# Patient Record
Sex: Female | Born: 1945 | Race: White | Hispanic: No | Marital: Married | Smoking: Former smoker
Health system: Southern US, Community
[De-identification: ages and names within clinical notes are randomized; demographics above are authoritative.]

## PROBLEM LIST (undated history)

## (undated) DIAGNOSIS — J984 Other disorders of lung: Secondary | ICD-10-CM

## (undated) DIAGNOSIS — E119 Type 2 diabetes mellitus without complications: Secondary | ICD-10-CM

## (undated) DIAGNOSIS — I219 Acute myocardial infarction, unspecified: Secondary | ICD-10-CM

## (undated) DIAGNOSIS — N289 Disorder of kidney and ureter, unspecified: Secondary | ICD-10-CM

## (undated) DIAGNOSIS — I2699 Other pulmonary embolism without acute cor pulmonale: Secondary | ICD-10-CM

## (undated) DIAGNOSIS — N200 Calculus of kidney: Secondary | ICD-10-CM

## (undated) DIAGNOSIS — M797 Fibromyalgia: Secondary | ICD-10-CM

## (undated) DIAGNOSIS — I10 Essential (primary) hypertension: Secondary | ICD-10-CM

## (undated) DIAGNOSIS — E7800 Pure hypercholesterolemia, unspecified: Secondary | ICD-10-CM

## (undated) HISTORY — PX: FOOT SURGERY: SHX648

## (undated) HISTORY — PX: CHOLECYSTECTOMY: SHX55

## (undated) HISTORY — PX: KIDNEY SURGERY: SHX687

## (undated) HISTORY — DX: Other pulmonary embolism without acute cor pulmonale: I26.99

## (undated) HISTORY — PX: ABDOMINAL HYSTERECTOMY: SHX81

## (undated) HISTORY — DX: Pure hypercholesterolemia, unspecified: E78.00

## (undated) HISTORY — PX: TONSILLECTOMY: SUR1361

## (undated) HISTORY — PX: CORONARY STENT PLACEMENT: SHX1402

## (undated) HISTORY — DX: Essential (primary) hypertension: I10

## (undated) HISTORY — DX: Calculus of kidney: N20.0

---

## 2012-08-20 DIAGNOSIS — N200 Calculus of kidney: Secondary | ICD-10-CM | POA: Insufficient documentation

## 2012-08-20 DIAGNOSIS — N183 Chronic kidney disease, stage 3 unspecified: Secondary | ICD-10-CM | POA: Insufficient documentation

## 2012-08-20 DIAGNOSIS — I10 Essential (primary) hypertension: Secondary | ICD-10-CM | POA: Insufficient documentation

## 2012-08-20 DIAGNOSIS — F418 Other specified anxiety disorders: Secondary | ICD-10-CM | POA: Insufficient documentation

## 2012-08-20 DIAGNOSIS — K219 Gastro-esophageal reflux disease without esophagitis: Secondary | ICD-10-CM | POA: Insufficient documentation

## 2012-10-06 DIAGNOSIS — I519 Heart disease, unspecified: Secondary | ICD-10-CM | POA: Insufficient documentation

## 2013-02-18 DIAGNOSIS — G4733 Obstructive sleep apnea (adult) (pediatric): Secondary | ICD-10-CM | POA: Insufficient documentation

## 2014-05-16 DIAGNOSIS — I255 Ischemic cardiomyopathy: Secondary | ICD-10-CM | POA: Insufficient documentation

## 2014-11-03 DIAGNOSIS — E662 Morbid (severe) obesity with alveolar hypoventilation: Secondary | ICD-10-CM | POA: Insufficient documentation

## 2015-06-12 DIAGNOSIS — R5383 Other fatigue: Secondary | ICD-10-CM | POA: Insufficient documentation

## 2016-01-04 DIAGNOSIS — F419 Anxiety disorder, unspecified: Secondary | ICD-10-CM | POA: Insufficient documentation

## 2016-01-04 DIAGNOSIS — E785 Hyperlipidemia, unspecified: Secondary | ICD-10-CM | POA: Insufficient documentation

## 2016-01-04 DIAGNOSIS — J449 Chronic obstructive pulmonary disease, unspecified: Secondary | ICD-10-CM | POA: Insufficient documentation

## 2016-01-04 DIAGNOSIS — B372 Candidiasis of skin and nail: Secondary | ICD-10-CM | POA: Insufficient documentation

## 2016-08-01 DIAGNOSIS — M797 Fibromyalgia: Secondary | ICD-10-CM | POA: Insufficient documentation

## 2016-09-01 DIAGNOSIS — E559 Vitamin D deficiency, unspecified: Secondary | ICD-10-CM | POA: Insufficient documentation

## 2017-02-10 DIAGNOSIS — I209 Angina pectoris, unspecified: Secondary | ICD-10-CM | POA: Insufficient documentation

## 2017-02-10 DIAGNOSIS — E871 Hypo-osmolality and hyponatremia: Secondary | ICD-10-CM | POA: Insufficient documentation

## 2017-04-15 DIAGNOSIS — N952 Postmenopausal atrophic vaginitis: Secondary | ICD-10-CM | POA: Insufficient documentation

## 2018-02-01 ENCOUNTER — Emergency Department (HOSPITAL_COMMUNITY)
Admission: EM | Admit: 2018-02-01 | Discharge: 2018-02-02 | Disposition: A | Discharge status: Home / Self Care | Payer: Medicare Other | Attending: Emergency Medicine | Admitting: Emergency Medicine

## 2018-02-01 ENCOUNTER — Emergency Department (HOSPITAL_COMMUNITY): Payer: Medicare Other

## 2018-02-01 ENCOUNTER — Other Ambulatory Visit: Payer: Self-pay

## 2018-02-01 ENCOUNTER — Encounter (HOSPITAL_COMMUNITY): Payer: Self-pay | Admitting: Emergency Medicine

## 2018-02-01 DIAGNOSIS — M542 Cervicalgia: Secondary | ICD-10-CM | POA: Insufficient documentation

## 2018-02-01 DIAGNOSIS — S22000A Wedge compression fracture of unspecified thoracic vertebra, initial encounter for closed fracture: Secondary | ICD-10-CM | POA: Diagnosis not present

## 2018-02-01 DIAGNOSIS — W19XXXA Unspecified fall, initial encounter: Secondary | ICD-10-CM

## 2018-02-01 DIAGNOSIS — S50311A Abrasion of right elbow, initial encounter: Secondary | ICD-10-CM | POA: Diagnosis not present

## 2018-02-01 DIAGNOSIS — Y9289 Other specified places as the place of occurrence of the external cause: Secondary | ICD-10-CM | POA: Insufficient documentation

## 2018-02-01 DIAGNOSIS — Z79899 Other long term (current) drug therapy: Secondary | ICD-10-CM | POA: Insufficient documentation

## 2018-02-01 DIAGNOSIS — M25551 Pain in right hip: Secondary | ICD-10-CM | POA: Diagnosis not present

## 2018-02-01 DIAGNOSIS — S0003XA Contusion of scalp, initial encounter: Secondary | ICD-10-CM | POA: Insufficient documentation

## 2018-02-01 DIAGNOSIS — Z7984 Long term (current) use of oral hypoglycemic drugs: Secondary | ICD-10-CM | POA: Insufficient documentation

## 2018-02-01 DIAGNOSIS — E119 Type 2 diabetes mellitus without complications: Secondary | ICD-10-CM | POA: Diagnosis not present

## 2018-02-01 DIAGNOSIS — W01198A Fall on same level from slipping, tripping and stumbling with subsequent striking against other object, initial encounter: Secondary | ICD-10-CM | POA: Insufficient documentation

## 2018-02-01 DIAGNOSIS — Z955 Presence of coronary angioplasty implant and graft: Secondary | ICD-10-CM | POA: Insufficient documentation

## 2018-02-01 DIAGNOSIS — Y999 Unspecified external cause status: Secondary | ICD-10-CM | POA: Insufficient documentation

## 2018-02-01 DIAGNOSIS — S5001XA Contusion of right elbow, initial encounter: Secondary | ICD-10-CM

## 2018-02-01 DIAGNOSIS — Y9389 Activity, other specified: Secondary | ICD-10-CM | POA: Insufficient documentation

## 2018-02-01 DIAGNOSIS — Z7982 Long term (current) use of aspirin: Secondary | ICD-10-CM | POA: Diagnosis not present

## 2018-02-01 DIAGNOSIS — S0990XA Unspecified injury of head, initial encounter: Secondary | ICD-10-CM | POA: Diagnosis present

## 2018-02-01 HISTORY — DX: Disorder of kidney and ureter, unspecified: N28.9

## 2018-02-01 HISTORY — DX: Type 2 diabetes mellitus without complications: E11.9

## 2018-02-01 HISTORY — DX: Fibromyalgia: M79.7

## 2018-02-01 NOTE — ED Notes (Signed)
Patient transported to X-ray 

## 2018-02-01 NOTE — ED Notes (Signed)
Bed: [id number] Expected date:  Expected time:  Means of arrival:  Comments: [age] yr female, fall, hip pain, head injury

## 2018-02-01 NOTE — ED Notes (Signed)
Pt back from radiology at this time.

## 2018-02-01 NOTE — ED Provider Notes (Signed)
[facility]-EMERGENCY DEPT Provider Note   CSN: [id number] Arrival date & time: 02/01/18  2116     History   Chief Complaint Chief Complaint  Patient presents with  . Fall    HPI Valerie Keller is a 72 y.o. female.  HPI  72 year old female presents after a fall.  She was getting out of her car and bending down to pick up something when her hand slipped from on the car and she fell down to the ground.  She landed on the cement.  She thinks she hit her head on her occiput first.  She did not lose consciousness and denies dizziness but is having a severe headache.  This occurred about 30 minutes prior to arrival.  She also injured her neck, midline and right-sided and upper back.  She has chronic low back pain and fibromyalgia but states that her low back pain is not any new or different since the fall.  Has an abrasion to her right elbow and pain over her right lateral hip. Last tetanus within last year.  Past Medical History:  Diagnosis Date  . Diabetes mellitus without complication (HCC)   . Fibromyalgia   . Renal disorder     There are no active problems to display for this patient.   Past Surgical History:  Procedure Laterality Date  . ABDOMINAL HYSTERECTOMY    . CHOLECYSTECTOMY    . CORONARY STENT PLACEMENT     x3  . TONSILLECTOMY       OB History   None      Home Medications    Prior to Admission medications   Medication Sig Start Date End Date Taking? Authorizing Provider  albuterol (PROVENTIL) (2.5 MG/3ML) 0.083% nebulizer solution Take 3 mLs by nebulization every 4 (four) hours as needed for shortness of breath. 01/13/18 02/13/18 Yes [provider]  Alpha-Lipoic Acid 300 MG CAPS Take 1 capsule by mouth daily.   Yes [provider]  aspirin 81 MG tablet Take 81 mg by mouth daily.   Yes [provider]  atorvastatin (LIPITOR) 40 MG tablet Take 40 mg by mouth at bedtime. 01/07/18  Yes [provider]    carvedilol (COREG) 6.25 MG tablet Take 6.25 mg by mouth 2 (two) times daily. 11/20/17 11/21/18 Yes [provider]  clonazePAM (KLONOPIN) 2 MG tablet Take 1-2 tablets by mouth 2 (two) times daily. 1 tablet in the AM and 2 tablets in the evening 01/09/18  Yes [provider]  DULoxetine (CYMBALTA) 60 MG capsule Take 2 capsules by mouth daily. 12/01/17  Yes [provider]  fluticasone (FLONASE ALLERGY RELIEF) 50 MCG/ACT nasal spray Place 2 sprays into the nose daily as needed for allergies.   Yes [provider]  furosemide (LASIX) 40 MG tablet Take 80 mg by mouth daily. 10/23/17  Yes [provider]  levOCARNitine L-Tartrate (L-CARNITINE) 500 MG CAPS Take 500 mg by mouth daily. 05/14/15  Yes [provider]  Magnesium Oxide 400 (240 Mg) MG TABS Take 1-2 tablets by mouth 2 (two) times daily. 1 tablet in the AM and 2 tablets in the evening 11/09/17  Yes [provider]  metFORMIN (GLUCOPHAGE) 850 MG tablet Take 0.5 tablets by mouth 2 (two) times daily. 10/15/17  Yes [provider]  Multiple Vitamin (MULTIVITAMIN) capsule Take 1 capsule by mouth daily.   Yes [provider]  nitroGLYCERIN (NITROSTAT) 0.4 MG SL tablet PLACE 1 TABLET UNDER TONGUE EVERY 5 MIN UP TO 3  DOSES AS NEEDED FOR CHEST PAIN.CALL 911 IF PERSISTS 02/03/17 02/04/18 Yes [provider]  potassium chloride SA (K-DUR,KLOR-CON) 20 MEQ tablet Take 20 mEq by mouth daily.   Yes [provider]  rOPINIRole (REQUIP) 2 MG tablet Take 3 tablets by mouth at bedtime. 08/15/17 08/16/18 Yes [provider]  spironolactone (ALDACTONE) 25 MG tablet Take 25 mg by mouth daily. 12/01/17  Yes [provider]  Vitamin D, Ergocalciferol, (DRISDOL) 50000 units CAPS capsule Take 1 capsule by mouth every 14 (fourteen) days. 01/07/18  Yes [provider]    Family History History reviewed. No pertinent family history.  Social History Social  History   Tobacco Use  . Smoking status: Never Smoker  . Smokeless tobacco: Never Used  Substance Use Topics  . Alcohol use: Never    Frequency: Never  . Drug use: Never     Allergies   Ciprofloxacin; Penicillins; and Latex   Review of Systems Review of Systems  Cardiovascular: Negative for chest pain.  Gastrointestinal: Negative for abdominal pain and vomiting.  Musculoskeletal: Positive for back pain and neck pain.  Neurological: Positive for headaches. Negative for weakness.  All other systems reviewed and are negative.    Physical Exam Updated Vital Signs BP (!) 123/91 (BP Location: Left Arm)   Pulse 87   Temp 97.9 F (36.6 C) (Oral)   Resp 18   Ht 5\' 5"  (1.651 m)   Wt 96.2 kg (212 lb)   SpO2 95%   BMI 35.28 kg/m   Physical Exam  Constitutional: She is oriented to person, place, and time. She appears well-developed and well-nourished.  HENT:  Head: Normocephalic. Head is with abrasion and with contusion.    Right Ear: External ear normal.  Left Ear: External ear normal.  Nose: Nose normal.  Eyes: Right eye exhibits no discharge. Left eye exhibits no discharge.  Neck: Spinous process tenderness and muscular tenderness (right sided) present.  Neck in towel roll  Cardiovascular: Normal rate, regular rhythm and normal heart sounds.  Pulses:      Radial pulses are 2+ on the right side.       Dorsalis pedis pulses are 2+ on the right side.  Pulmonary/Chest: Effort normal and breath sounds normal.  Abdominal: Soft. There is no tenderness.  Musculoskeletal:       Right shoulder: Normal. She exhibits normal range of motion and no tenderness.       Right elbow: She exhibits normal range of motion and no deformity. Tenderness (mild) found.       Right hip: She exhibits tenderness (mild, lateral). She exhibits normal range of motion.       Cervical back: She exhibits tenderness.       Thoracic back: She exhibits tenderness.       Lumbar back: She exhibits no  tenderness.       Right upper arm: She exhibits no tenderness.       Right forearm: She exhibits no tenderness.       Arms:      Legs: Normal radial, ulnar and median nerve testing in RUE.  Neurological: She is alert and oriented to person, place, and time.  Skin: Skin is warm and dry.  Nursing note and vitals reviewed.    ED Treatments / Results  Labs (all labs ordered are listed, but only abnormal results are displayed) Labs Reviewed - No data to display  EKG None  Radiology Dg Thoracic Spine W/swimmers  Result Date: 02/01/2018 CLINICAL DATA:  72 y/o  F; mid posterior upper back pain after fall. EXAM: THORACIC SPINE - 3 VIEWS COMPARISON:  None. FINDINGS: Mild T4, moderate T10, and mild T12 compression deformities. Normal thoracic kyphosis without listhesis. Multilevel discogenic degenerative changes of the spine with loss of disc space height and marginal osteophytes. IMPRESSION: Mild T4, moderate T10, and mild T12 compression deformities, age indeterminate. Consider thoracic spine MRI to better characterize. Electronically Signed   By: Mitzi Hansen M.D.   On: 02/01/2018 22:24   Dg Elbow Complete Right  Result Date: 02/01/2018 CLINICAL DATA:  Status post fall, with right elbow abrasion and pain. Initial encounter. EXAM: RIGHT ELBOW - COMPLETE 3+ VIEW COMPARISON:  None. FINDINGS: There is no evidence of fracture or dislocation. The visualized joint spaces are preserved. No significant joint effusion is identified. Dorsal soft tissue swelling is noted overlying the olecranon. IMPRESSION: No evidence of fracture or dislocation. Dorsal soft tissue swelling noted. Electronically Signed   By: Roanna Raider M.D.   On: 02/01/2018 22:23   Ct Head Wo Contrast  Result Date: 02/01/2018 CLINICAL DATA:  Larey Seat and hit posterior head EXAM: CT HEAD WITHOUT CONTRAST CT CERVICAL SPINE WITHOUT CONTRAST TECHNIQUE: Multidetector CT imaging of the head and cervical spine was performed following  the standard protocol without intravenous contrast. Multiplanar CT image reconstructions of the cervical spine were also generated. COMPARISON:  None. FINDINGS: CT HEAD FINDINGS Brain: No acute territorial infarction, hemorrhage or intracranial mass is visualized. Moderate atrophy. Ventricle size within normal limits given atrophy. Vascular: No hyperdense vessels. Vertebral artery and carotid vascular calcification Skull: No fracture Sinuses/Orbits: No acute finding. Other: Large right posterior parietal scalp hematoma CT CERVICAL SPINE FINDINGS Alignment: Straightening of the cervical spine. No subluxation. Facet alignment within normal limits Skull base and vertebrae: Craniovertebral junction is intact. Minimal superior endplate deformity at C7, T1 and T2 with possible Schmorl's node. Soft tissues and spinal canal: No prevertebral fluid or swelling. No visible canal hematoma. Disc levels: Moderate degenerative changes at C5-C6 with mild degenerative changes at C5 and C6-C7. Upper chest: Lung apices clear. Subcentimeter hypodense nodule right lobe of thyroid Other: None IMPRESSION: 1. No CT evidence for acute intracranial abnormality.  Atrophy 2. Large right posterior parietal scalp hematoma without underlying fracture 3. Straightening of the cervical spine. Probable chronic minimal superior endplate deformities at C7, T1 and T2 with minimal Schmorl's node. No definite acute osseous abnormality. Electronically Signed   By: Jasmine Pang M.D.   On: 02/01/2018 22:33   Ct Cervical Spine Wo Contrast  Result Date: 02/01/2018 CLINICAL DATA:  Larey Seat and hit posterior head EXAM: CT HEAD WITHOUT CONTRAST CT CERVICAL SPINE WITHOUT CONTRAST TECHNIQUE: Multidetector CT imaging of the head and cervical spine was performed following the standard protocol without intravenous contrast. Multiplanar CT image reconstructions of the cervical spine were also generated. COMPARISON:  None. FINDINGS: CT HEAD FINDINGS Brain: No acute  territorial infarction, hemorrhage or intracranial mass is visualized. Moderate atrophy. Ventricle size within normal limits given atrophy. Vascular: No hyperdense vessels. Vertebral artery and carotid vascular calcification Skull: No fracture Sinuses/Orbits: No acute finding. Other: Large right posterior parietal scalp hematoma CT CERVICAL SPINE FINDINGS Alignment: Straightening of the cervical spine. No subluxation. Facet alignment within normal limits Skull base and vertebrae: Craniovertebral junction is intact. Minimal superior endplate deformity at C7, T1 and T2 with possible Schmorl's node. Soft tissues and spinal canal: No prevertebral fluid or swelling. No visible canal hematoma. Disc levels: Moderate degenerative changes at C5-C6 with mild degenerative changes  at C5 and C6-C7. Upper chest: Lung apices clear. Subcentimeter hypodense nodule right lobe of thyroid Other: None IMPRESSION: 1. No CT evidence for acute intracranial abnormality.  Atrophy 2. Large right posterior parietal scalp hematoma without underlying fracture 3. Straightening of the cervical spine. Probable chronic minimal superior endplate deformities at C7, T1 and T2 with minimal Schmorl's node. No definite acute osseous abnormality. Electronically Signed   By: Jasmine Pang M.D.   On: 02/01/2018 22:33   Ct Thoracic Spine Wo Contrast  Result Date: 02/01/2018 CLINICAL DATA:  Larey Seat at hotel while walking to car. Evaluate thoracic spine fracture. EXAM: CT THORACIC AND LUMBAR SPINE WITHOUT CONTRAST TECHNIQUE: Multidetector CT imaging of the thoracic and lumbar spine was performed without contrast. Multiplanar CT image reconstructions were also generated. COMPARISON:  Thoracic spine radiographs February 01, 2018 FINDINGS: CT THORACIC SPINE FINDINGS ALIGNMENT: Maintained thoracic lordosis. No malalignment. VERTEBRAE: C7 through T2 superior endplate Schmorl's nodes without significant height loss. Mild T3 compression fracture with Schmorl's node,  less than 20% height loss. Moderate T4 superior endplate compression fracture with Schmorl's node and proximally 50% height loss. Moderate to severe T10 compression (2 column) fracture with 50-75% height loss, marginal osteophytes. Mild T12 inferior endplate compression fracture with less than 25% height loss. With T10-11 and T11-12 vacuum discs. Multilevel moderate facet arthropathy. Osteopenia. No destructive bony lesions. PARASPINAL AND OTHER SOFT TISSUES: Nonacute. Heterogeneous thyroid without dominant nodule. Mild calcific atherosclerosis aortic arch. Coronary artery calcifications. 10 mm LEFT adrenal benign adenoma (-12 Hounsfield units) status post cholecystectomy DISC LEVELS: Mild canal stenosis T9-10, T10-11. Mild neural foraminal narrowing LEFT T7 through T10-11. CT LUMBAR SPINE FINDINGS SEGMENTATION: For the purposes of this report the last well-formed intervertebral disc space is reported as L5-S1. ALIGNMENT: Maintained lumbar lordosis. No malalignment. VERTEBRAE: Old severe L4 compression fracture with greater than 75% central height loss and L3-4 endplate spurring. Mild chronic L5 compression fracture with less than 25% height loss. Remaining lumbar vertebral bodies intact. Intervertebral disc heights generally preserved. Osteopenia without destructive bony lesions. PARASPINAL AND OTHER SOFT TISSUES: Nonacute. 2.9 cm cyst LEFT interpolar kidney. Severe calcific atherosclerosis aortoiliac vessels. Mild symmetric paraspinal muscle atrophy. DISC LEVELS: L1-2, L2-3: No disc bulge, canal stenosis nor neural foraminal narrowing. Mild facet arthropathy. L3-4: Endplate spurring. Moderate facet arthropathy and ligamentum flavum redundancy. Minimal canal stenosis and minimal RIGHT neural foraminal narrowing. L4-5: Annular bulging. Moderate to severe facet arthropathy without canal stenosis. Mild neural foraminal narrowing. L5-S1: Small broad-based disc osteophyte complex asymmetric to LEFT. Severe RIGHT greater  than LEFT facet arthropathy without canal stenosis. Soft tissue within LEFT neural foramen, possible extrusion resulting in severe LEFT neural foraminal effacement. IMPRESSION: CT THORACIC SPINE IMPRESSION 1. Mild T3, moderate T4, moderate to severe T10, mild T12 compression fractures, generally appearing old though there could be an acute component at T10. Recommend correlation with point tenderness. MRI would be definitive. 2. No malalignment. CT LUMBAR SPINE IMPRESSION 1. Old severe L4 compression fracture. No acute fracture or malalignment. 2. L5-S1 disc extrusion or synovial cyst resulting in severely effaced LEFT L5-S1 neural foramen. Aortic Atherosclerosis (ICD10-I70.0). Electronically Signed   By: Awilda Metro M.D.   On: 02/01/2018 23:47   Ct Lumbar Spine Wo Contrast  Result Date: 02/01/2018 CLINICAL DATA:  Larey Seat at hotel while walking to car. Evaluate thoracic spine fracture. EXAM: CT THORACIC AND LUMBAR SPINE WITHOUT CONTRAST TECHNIQUE: Multidetector CT imaging of the thoracic and lumbar spine was performed without contrast. Multiplanar CT image reconstructions were also generated. COMPARISON:  Thoracic spine radiographs February 01, 2018 FINDINGS: CT THORACIC SPINE FINDINGS ALIGNMENT: Maintained thoracic lordosis. No malalignment. VERTEBRAE: C7 through T2 superior endplate Schmorl's nodes without significant height loss. Mild T3 compression fracture with Schmorl's node, less than 20% height loss. Moderate T4 superior endplate compression fracture with Schmorl's node and proximally 50% height loss. Moderate to severe T10 compression (2 column) fracture with 50-75% height loss, marginal osteophytes. Mild T12 inferior endplate compression fracture with less than 25% height loss. With T10-11 and T11-12 vacuum discs. Multilevel moderate facet arthropathy. Osteopenia. No destructive bony lesions. PARASPINAL AND OTHER SOFT TISSUES: Nonacute. Heterogeneous thyroid without dominant nodule. Mild calcific  atherosclerosis aortic arch. Coronary artery calcifications. 10 mm LEFT adrenal benign adenoma (-12 Hounsfield units) status post cholecystectomy DISC LEVELS: Mild canal stenosis T9-10, T10-11. Mild neural foraminal narrowing LEFT T7 through T10-11. CT LUMBAR SPINE FINDINGS SEGMENTATION: For the purposes of this report the last well-formed intervertebral disc space is reported as L5-S1. ALIGNMENT: Maintained lumbar lordosis. No malalignment. VERTEBRAE: Old severe L4 compression fracture with greater than 75% central height loss and L3-4 endplate spurring. Mild chronic L5 compression fracture with less than 25% height loss. Remaining lumbar vertebral bodies intact. Intervertebral disc heights generally preserved. Osteopenia without destructive bony lesions. PARASPINAL AND OTHER SOFT TISSUES: Nonacute. 2.9 cm cyst LEFT interpolar kidney. Severe calcific atherosclerosis aortoiliac vessels. Mild symmetric paraspinal muscle atrophy. DISC LEVELS: L1-2, L2-3: No disc bulge, canal stenosis nor neural foraminal narrowing. Mild facet arthropathy. L3-4: Endplate spurring. Moderate facet arthropathy and ligamentum flavum redundancy. Minimal canal stenosis and minimal RIGHT neural foraminal narrowing. L4-5: Annular bulging. Moderate to severe facet arthropathy without canal stenosis. Mild neural foraminal narrowing. L5-S1: Small broad-based disc osteophyte complex asymmetric to LEFT. Severe RIGHT greater than LEFT facet arthropathy without canal stenosis. Soft tissue within LEFT neural foramen, possible extrusion resulting in severe LEFT neural foraminal effacement. IMPRESSION: CT THORACIC SPINE IMPRESSION 1. Mild T3, moderate T4, moderate to severe T10, mild T12 compression fractures, generally appearing old though there could be an acute component at T10. Recommend correlation with point tenderness. MRI would be definitive. 2. No malalignment. CT LUMBAR SPINE IMPRESSION 1. Old severe L4 compression fracture. No acute fracture  or malalignment. 2. L5-S1 disc extrusion or synovial cyst resulting in severely effaced LEFT L5-S1 neural foramen. Aortic Atherosclerosis (ICD10-I70.0). Electronically Signed   By: Awilda Metro M.D.   On: 02/01/2018 23:47   Dg Hip Unilat With Pelvis 2-3 Views Right  Result Date: 02/01/2018 CLINICAL DATA:  Status post fall, with acute onset of right hip pain. Initial encounter. EXAM: DG HIP (WITH OR WITHOUT PELVIS) 2-3V RIGHT COMPARISON:  None. FINDINGS: There is no evidence of fracture or dislocation. Both femoral heads are seated normally within their respective acetabula. The proximal right femur appears intact. No significant degenerative change is appreciated. The sacroiliac joints are unremarkable in appearance. The visualized bowel gas pattern is grossly unremarkable in appearance. Postoperative change is noted overlying the sacrum. IMPRESSION: No evidence of fracture or dislocation. Electronically Signed   By: Roanna Raider M.D.   On: 02/01/2018 22:22    Procedures Procedures (including critical care time)  Medications Ordered in ED Medications  rOPINIRole (REQUIP) tablet 6 mg (has no administration in time range)     Initial Impression / Assessment and Plan / ED Course  I have reviewed the triage vital signs and the nursing notes.  Pertinent labs & imaging results that were available during my care of the patient were reviewed by me and considered in  my medical decision making (see chart for details).     Patient appears to have multiple compression fractures as above.  The patient most likely has one new one although when I discussed with neurosurgery, they feel this might also be subacute or old.  The cervical spine fractures appear old and thus no indication for cervical spine immobilization per neurosurgery.  At this point, Dr. Jordan Likes recommends no thoracic or lumbar bracing and is said she can follow-up with her spine/back specialist who manages her chronic pain as an  outpatient.  She is able to ambulate.  This was a mechanical fall and she otherwise appears stable for discharge home.  She has declined pain management in the ED.  Final Clinical Impressions(s) / ED Diagnoses   Final diagnoses:  Fall, initial encounter  Contusion of scalp, initial encounter  Closed compression fracture of thoracic vertebra, initial encounter (HCC)  Contusion of right elbow, initial encounter    ED Discharge Orders    None       Pricilla Loveless, MD 02/02/18 [phone]

## 2018-02-01 NOTE — ED Triage Notes (Signed)
Pt presents by EMS from a hotel after falling and landing on right side of body and posterior portion of head. Pt reports that she lost her balance when walking around the car and reporting pain to head and right elbow where abrasion is noted.

## 2018-02-02 MED ORDER — ROPINIROLE HCL 1 MG PO TABS
6.0000 mg | ORAL_TABLET | ORAL | Status: AC
  Administered 2018-02-02: 6 mg via ORAL
  Filled 2018-02-02: qty 6

## 2018-02-02 NOTE — ED Notes (Signed)
Pt ambulated in hallway without difficulty

## 2018-02-02 NOTE — ED Notes (Signed)
Pt stated she had been holding urine since arrival at ED because of how painful it was to move. Placed PureWick to assist with voiding.

## 2018-02-15 ENCOUNTER — Emergency Department (HOSPITAL_BASED_OUTPATIENT_CLINIC_OR_DEPARTMENT_OTHER)
Admission: EM | Admit: 2018-02-15 | Discharge: 2018-02-15 | Disposition: A | Discharge status: Home / Self Care | Payer: Medicare Other | Attending: Emergency Medicine | Admitting: Emergency Medicine

## 2018-02-15 ENCOUNTER — Encounter (HOSPITAL_BASED_OUTPATIENT_CLINIC_OR_DEPARTMENT_OTHER): Payer: Self-pay | Admitting: Emergency Medicine

## 2018-02-15 ENCOUNTER — Other Ambulatory Visit: Payer: Self-pay

## 2018-02-15 DIAGNOSIS — Z7982 Long term (current) use of aspirin: Secondary | ICD-10-CM | POA: Diagnosis not present

## 2018-02-15 DIAGNOSIS — R739 Hyperglycemia, unspecified: Secondary | ICD-10-CM

## 2018-02-15 DIAGNOSIS — Z79899 Other long term (current) drug therapy: Secondary | ICD-10-CM | POA: Diagnosis not present

## 2018-02-15 DIAGNOSIS — Z7984 Long term (current) use of oral hypoglycemic drugs: Secondary | ICD-10-CM | POA: Insufficient documentation

## 2018-02-15 DIAGNOSIS — E1165 Type 2 diabetes mellitus with hyperglycemia: Secondary | ICD-10-CM | POA: Insufficient documentation

## 2018-02-15 DIAGNOSIS — Z91040 Latex allergy status: Secondary | ICD-10-CM | POA: Insufficient documentation

## 2018-02-15 DIAGNOSIS — I252 Old myocardial infarction: Secondary | ICD-10-CM | POA: Diagnosis not present

## 2018-02-15 HISTORY — DX: Acute myocardial infarction, unspecified: I21.9

## 2018-02-15 HISTORY — DX: Other disorders of lung: J98.4

## 2018-02-15 LAB — CBC WITH DIFFERENTIAL/PLATELET
BASOS PCT: 1 %
Basophils Absolute: 0 10*3/uL (ref 0.0–0.1)
EOS ABS: 0.2 10*3/uL (ref 0.0–0.7)
Eosinophils Relative: 2 %
HEMATOCRIT: 43 % (ref 36.0–46.0)
Hemoglobin: 15 g/dL (ref 12.0–15.0)
LYMPHS ABS: 2.9 10*3/uL (ref 0.7–4.0)
Lymphocytes Relative: 36 %
MCH: 34.2 pg — AB (ref 26.0–34.0)
MCHC: 34.9 g/dL (ref 30.0–36.0)
MCV: 98.2 fL (ref 78.0–100.0)
MONO ABS: 0.8 10*3/uL (ref 0.1–1.0)
MONOS PCT: 10 %
Neutro Abs: 4.1 10*3/uL (ref 1.7–7.7)
Neutrophils Relative %: 51 %
Platelets: 187 10*3/uL (ref 150–400)
RBC: 4.38 MIL/uL (ref 3.87–5.11)
RDW: 14.5 % (ref 11.5–15.5)
WBC: 8 10*3/uL (ref 4.0–10.5)

## 2018-02-15 LAB — BASIC METABOLIC PANEL
Anion gap: 12 (ref 5–15)
BUN: 19 mg/dL (ref 6–20)
CALCIUM: 9.4 mg/dL (ref 8.9–10.3)
CO2: 31 mmol/L (ref 22–32)
CREATININE: 1.18 mg/dL — AB (ref 0.44–1.00)
Chloride: 90 mmol/L — ABNORMAL LOW (ref 101–111)
GFR calc non Af Amer: 45 mL/min — ABNORMAL LOW (ref >60)
GFR, EST AFRICAN AMERICAN: 52 mL/min — AB (ref >60)
Glucose, Bld: 374 mg/dL — ABNORMAL HIGH (ref 65–99)
Potassium: 3 mmol/L — ABNORMAL LOW (ref 3.5–5.1)
SODIUM: 133 mmol/L — AB (ref 135–145)

## 2018-02-15 LAB — URINALYSIS, MICROSCOPIC (REFLEX)

## 2018-02-15 LAB — URINALYSIS, ROUTINE W REFLEX MICROSCOPIC
Bilirubin Urine: NEGATIVE
KETONES UR: NEGATIVE mg/dL
Nitrite: NEGATIVE
PROTEIN: NEGATIVE mg/dL
Specific Gravity, Urine: 1.01 (ref 1.005–1.030)
pH: 6 (ref 5.0–8.0)

## 2018-02-15 LAB — HEPATIC FUNCTION PANEL
ALBUMIN: 3.3 g/dL — AB (ref 3.5–5.0)
ALT: 33 U/L (ref 14–54)
AST: 31 U/L (ref 15–41)
Alkaline Phosphatase: 63 U/L (ref 38–126)
BILIRUBIN DIRECT: 0.1 mg/dL (ref 0.1–0.5)
Indirect Bilirubin: 0.9 mg/dL (ref 0.3–0.9)
Total Bilirubin: 1 mg/dL (ref 0.3–1.2)
Total Protein: 6.3 g/dL — ABNORMAL LOW (ref 6.5–8.1)

## 2018-02-15 LAB — I-STAT TROPONIN, ED: TROPONIN I, POC: 0.01 ng/mL (ref 0.00–0.08)

## 2018-02-15 LAB — LIPASE, BLOOD: LIPASE: 26 U/L (ref 11–51)

## 2018-02-15 LAB — CBG MONITORING, ED
Glucose-Capillary: 266 mg/dL — ABNORMAL HIGH (ref 65–99)
Glucose-Capillary: 403 mg/dL — ABNORMAL HIGH (ref 65–99)

## 2018-02-15 LAB — TROPONIN I

## 2018-02-15 MED ORDER — SODIUM CHLORIDE 0.9 % IV BOLUS
1000.0000 mL | Freq: Once | INTRAVENOUS | Status: AC
  Administered 2018-02-15: 1000 mL via INTRAVENOUS

## 2018-02-15 MED ORDER — POTASSIUM CHLORIDE CRYS ER 20 MEQ PO TBCR
40.0000 meq | EXTENDED_RELEASE_TABLET | Freq: Once | ORAL | Status: AC
  Administered 2018-02-15: 40 meq via ORAL
  Filled 2018-02-15: qty 2

## 2018-02-15 NOTE — Discharge Instructions (Addendum)
You were seen today for high blood sugars.  Make sure that you are following a low carbohydrate diet and taking her medications as prescribed.  If you develop any new or worsening symptoms you should be reevaluated.

## 2018-02-15 NOTE — ED Provider Notes (Signed)
[facility] EMERGENCY DEPARTMENT Provider Note   CSN: [id number] Arrival date & time: 02/15/18  0004     History   Chief Complaint Chief Complaint  Patient presents with  . Hyperglycemia    HPI Valerie Keller is a 72 y.o. female.  HPI  This is a 72 year old female with a history of diabetes on metformin and insulin who presents with hyperglycemia.  Husband reports that all routine checking this evening her glucometer read as "high" with multiple subsequent fingersticks greater than 400.  She took her metformin and 22 units of insulin as she normally does.  Husband reports that they are currently "very busy" and may have not been eating a great diet.  He states that she sleeps much of the day which is not new for her.  No recent infectious symptoms.  Patient denies any chest pain, shortness of breath, nausea, vomiting.  She does not baseline report chronic back pain and "pain all over."  She has previously seen a pain specialist.  Past Medical History:  Diagnosis Date  . Diabetes mellitus without complication (HCC)   . Fibromyalgia   . Lung disease   . Myocardial infarction (HCC)   . Renal disorder     There are no active problems to display for this patient.   Past Surgical History:  Procedure Laterality Date  . ABDOMINAL HYSTERECTOMY    . CHOLECYSTECTOMY    . CORONARY STENT PLACEMENT     x3  . FOOT SURGERY    . KIDNEY SURGERY    . TONSILLECTOMY       OB History   None      Home Medications    Prior to Admission medications   Medication Sig Start Date End Date Taking? Authorizing Provider  albuterol (PROVENTIL) (2.5 MG/3ML) 0.083% nebulizer solution Take 3 mLs by nebulization every 4 (four) hours as needed for shortness of breath. 01/13/18 02/13/18  [provider]  Alpha-Lipoic Acid 300 MG CAPS Take 1 capsule by mouth daily.    [provider]  aspirin 81 MG tablet Take 81 mg by mouth daily.    [provider]    atorvastatin (LIPITOR) 40 MG tablet Take 40 mg by mouth at bedtime. 01/07/18   [provider]  carvedilol (COREG) 6.25 MG tablet Take 6.25 mg by mouth 2 (two) times daily. 11/20/17 11/21/18  [provider]  clonazePAM (KLONOPIN) 2 MG tablet Take 1-2 tablets by mouth 2 (two) times daily. 1 tablet in the AM and 2 tablets in the evening 01/09/18   [provider]  DULoxetine (CYMBALTA) 60 MG capsule Take 2 capsules by mouth daily. 12/01/17   [provider]  fluticasone Aleda Grana ALLERGY RELIEF) 50 MCG/ACT nasal spray Place 2 sprays into the nose daily as needed for allergies.    [provider]  furosemide (LASIX) 40 MG tablet Take 80 mg by mouth daily. 10/23/17   [provider]  levOCARNitine L-Tartrate (L-CARNITINE) 500 MG CAPS Take 500 mg by mouth daily. 05/14/15   [provider]  Magnesium Oxide 400 (240 Mg) MG TABS Take 1-2 tablets by mouth 2 (two) times daily. 1 tablet in the AM and 2 tablets in the evening 11/09/17   [provider]  metFORMIN (GLUCOPHAGE) 850 MG tablet Take 0.5 tablets by mouth 2 (two) times daily. 10/15/17   [provider]  Multiple Vitamin (MULTIVITAMIN) capsule Take 1 capsule by mouth daily.    [provider]  nitroGLYCERIN (NITROSTAT) 0.4 MG  SL tablet PLACE 1 TABLET UNDER TONGUE EVERY 5 MIN UP TO 3 DOSES AS NEEDED FOR CHEST PAIN.CALL 911 IF PERSISTS 02/03/17 02/04/18  [provider]  potassium chloride SA (K-DUR,KLOR-CON) 20 MEQ tablet Take 20 mEq by mouth daily.    [provider]  rOPINIRole (REQUIP) 2 MG tablet Take 3 tablets by mouth at bedtime. 08/15/17 08/16/18  [provider]  spironolactone (ALDACTONE) 25 MG tablet Take 25 mg by mouth daily. 12/01/17   [provider]  Vitamin D, Ergocalciferol, (DRISDOL) 50000 units CAPS capsule Take 1 capsule by mouth every 14 (fourteen) days. 01/07/18   [provider]    Family History No family  history on file.  Social History Social History   Tobacco Use  . Smoking status: Never Smoker  . Smokeless tobacco: Never Used  Substance Use Topics  . Alcohol use: Never    Frequency: Never  . Drug use: Never     Allergies   Ciprofloxacin; Penicillins; and Latex   Review of Systems Review of Systems  Constitutional: Negative for fever.  Respiratory: Negative for shortness of breath.   Cardiovascular: Negative for chest pain.  Gastrointestinal: Negative for abdominal pain, nausea and vomiting.  Genitourinary: Negative for dysuria.  Musculoskeletal: Positive for back pain.  Neurological: Negative for weakness and numbness.  All other systems reviewed and are negative.    Physical Exam Updated Vital Signs BP 118/65   Pulse 79   Temp 97.6 F (36.4 C) (Oral)   Resp (!) 21   Ht  (1.651 m)   Wt 96.2 kg (212 lb)   SpO2 97%   BMI 35.28 kg/m   Physical Exam  Constitutional: She is oriented to person, place, and time. She appears well-developed and well-nourished.  Obese, no acute distress  HENT:  Head: Normocephalic and atraumatic.  Eyes: Pupils are equal, round, and reactive to light.  Cardiovascular: Normal rate, regular rhythm and normal heart sounds.  Pulmonary/Chest: Effort normal and breath sounds normal. No respiratory distress. She has no wheezes.  Abdominal: Soft. Bowel sounds are normal. There is no tenderness.  Neurological: She is alert and oriented to person, place, and time.  Sleepy at times but arousable and nontoxic, cranial nerves II through XII intact, 5 out of 5 strength in all 4 extremities  Skin: Skin is warm and dry.  Psychiatric: She has a normal mood and affect.  Nursing note and vitals reviewed.    ED Treatments / Results  Labs (all labs ordered are listed, but only abnormal results are displayed) Labs Reviewed  CBC WITH DIFFERENTIAL/PLATELET - Abnormal; Notable for the following components:      Result Value   MCH 34.2 (*)     All other components within normal limits  BASIC METABOLIC PANEL - Abnormal; Notable for the following components:   Sodium 133 (*)    Potassium 3.0 (*)    Chloride 90 (*)    Glucose, Bld 374 (*)    Creatinine, Ser 1.18 (*)    GFR calc non Af Amer 45 (*)    GFR calc Af Amer 52 (*)    All other components within normal limits  HEPATIC FUNCTION PANEL - Abnormal; Notable for the following components:   Total Protein 6.3 (*)    Albumin 3.3 (*)    All other components within normal limits  CBG MONITORING, ED - Abnormal; Notable for the following components:   Glucose-Capillary 403 (*)    All other components within normal limits  CBG MONITORING, ED - Abnormal; Notable for the following components:   Glucose-Capillary 266 (*)    All other components within normal limits  LIPASE, BLOOD  TROPONIN I  URINALYSIS, ROUTINE W REFLEX MICROSCOPIC  I-STAT TROPONIN, ED    EKG EKG Interpretation  Date/Time:  Monday February 15 2018 01:38:54 EDT Ventricular Rate:  79 PR Interval:    QRS Duration: 83 QT Interval:  384 QTC Calculation: 441 R Axis:   -6 Text Interpretation:  Sinus rhythm Low voltage, extremity and precordial leads Confirmed by Ross Marcus ([id number]) on 02/15/2018 2:17:36 AM   Radiology No results found.  Procedures Procedures (including critical care time)  Medications Ordered in ED Medications  sodium chloride 0.9 % bolus 1,000 mL (1,000 mLs Intravenous New Bag/Given 02/15/18 0114)     Initial Impression / Assessment and Plan / ED Course  I have reviewed the triage vital signs and the nursing notes.  Pertinent labs & imaging results that were available during my care of the patient were reviewed by me and considered in my medical decision making (see chart for details).     She presents with hyperglycemia.  She is overall nontoxic-appearing.  Vital signs reassuring.  Initial blood sugar 403.  Patient is afebrile.  Physical exam is largely benign.  She does appear  sleepy but her husband states that they have been very busy trying to move and have been touring multiple apartments during the day.  EKG shows no evidence of ischemia.  Troponin is negative.  Basic lab work shows no evidence of an anion gap.  She has no significant leukocytosis.  After 1 L of fluids, blood sugar now in the 200s.  Husband states that this is her normal.  She is at her baseline.  Recommend dietary modification and continued close monitoring.  After history, exam, and medical workup I feel the patient has been appropriately medically screened and is safe for discharge home. Pertinent diagnoses were discussed with the patient. Patient was given return precautions.   Final Clinical Impressions(s) / ED Diagnoses   Final diagnoses:  Hyperglycemia    ED Discharge Orders    None       , Mayer Masker, MD 02/15/18 [phone]

## 2018-02-15 NOTE — ED Triage Notes (Signed)
Pt c/o CBG in >400 at home. Reports increased thirst and fatigue. States she has been seen for her DM but "they haven't gotten it under control yet". Denies other issues.

## 2018-04-12 ENCOUNTER — Emergency Department (INDEPENDENT_AMBULATORY_CARE_PROVIDER_SITE_OTHER)
Admission: EM | Admit: 2018-04-12 | Discharge: 2018-04-12 | Payer: Medicare Other | Source: Home / Self Care | Attending: Family Medicine | Admitting: Family Medicine

## 2018-04-12 DIAGNOSIS — E1165 Type 2 diabetes mellitus with hyperglycemia: Secondary | ICD-10-CM | POA: Insufficient documentation

## 2018-04-12 DIAGNOSIS — R739 Hyperglycemia, unspecified: Secondary | ICD-10-CM | POA: Diagnosis not present

## 2018-04-12 DIAGNOSIS — R531 Weakness: Secondary | ICD-10-CM

## 2018-04-12 LAB — POCT FASTING CBG KUC MANUAL ENTRY: POCT Glucose (KUC): 600 mg/dL — AB (ref 70–99)

## 2018-04-12 NOTE — ED Triage Notes (Signed)
Pt having altered mental status, was seen in ER a couple of weeks ago.  BG is >600

## 2018-04-12 NOTE — ED Provider Notes (Signed)
[facility]    CSN: [id number] Arrival date & time: 04/12/18  1320     History   Chief Complaint Chief Complaint  Patient presents with  . Weakness  . Hyperglycemia    HPI Valerie Keller is a 72 y.o. female.   HPI Valerie Keller is a 72 y.o. female presenting to UC accompanied by her husband with c/o generalized weakness, fatigue, and mild HA that started this morning. Pt is a diabetic and was seen at [facility] on 03/26/18 for hyperglycemia. Her sugar was in the 400s that time. She was able to be discharged after a few hours of insulin and fluids.  She recently moved to the area with her husband and is looking for a new PCP. Husband notes he believes they forgot to give her insulin last night and thinks that may have caused today's symptoms. Pt is c/o being thirsty.  Denies n/v/d.   Past Medical History:  Diagnosis Date  . Diabetes mellitus without complication (HCC)   . Fibromyalgia   . Lung disease   . Myocardial infarction (HCC)   . Renal disorder     There are no active problems to display for this patient.   Past Surgical History:  Procedure Laterality Date  . ABDOMINAL HYSTERECTOMY    . CHOLECYSTECTOMY    . CORONARY STENT PLACEMENT     x3  . FOOT SURGERY    . KIDNEY SURGERY    . TONSILLECTOMY      OB History   None      Home Medications    Prior to Admission medications   Medication Sig Start Date End Date Taking? Authorizing Provider  albuterol (PROVENTIL) (2.5 MG/3ML) 0.083% nebulizer solution Take 3 mLs by nebulization every 4 (four) hours as needed for shortness of breath. 01/13/18 02/13/18  [provider]  Alpha-Lipoic Acid 300 MG CAPS Take 1 capsule by mouth daily.    [provider]  aspirin 81 MG tablet Take 81 mg by mouth daily.    [provider]  atorvastatin (LIPITOR) 40 MG tablet Take 40 mg by mouth at bedtime. 01/07/18   [provider]  carvedilol (COREG) 6.25 MG tablet Take 6.25 mg by  mouth 2 (two) times daily. 11/20/17 11/21/18  [provider]  clonazePAM (KLONOPIN) 2 MG tablet Take 1-2 tablets by mouth 2 (two) times daily. 1 tablet in the AM and 2 tablets in the evening 01/09/18   [provider]  DULoxetine (CYMBALTA) 60 MG capsule Take 2 capsules by mouth daily. 12/01/17   [provider]  fluticasone Aleda Grana ALLERGY RELIEF) 50 MCG/ACT nasal spray Place 2 sprays into the nose daily as needed for allergies.    [provider]  furosemide (LASIX) 40 MG tablet Take 80 mg by mouth daily. 10/23/17   [provider]  levOCARNitine L-Tartrate (L-CARNITINE) 500 MG CAPS Take 500 mg by mouth daily. 05/14/15   [provider]  Magnesium Oxide 400 (240 Mg) MG TABS Take 1-2 tablets by mouth 2 (two) times daily. 1 tablet in the AM and 2 tablets in the evening 11/09/17   [provider]  metFORMIN (GLUCOPHAGE) 850 MG tablet Take 0.5 tablets by mouth 2 (two) times daily. 10/15/17   [provider]  Multiple Vitamin (MULTIVITAMIN) capsule Take 1 capsule by mouth daily.    [provider]  nitroGLYCERIN (NITROSTAT) 0.4 MG SL tablet PLACE 1 TABLET UNDER TONGUE EVERY 5 MIN UP TO 3 DOSES AS NEEDED FOR  CHEST PAIN.CALL 911 IF PERSISTS 02/03/17 02/04/18  [provider]  potassium chloride SA (K-DUR,KLOR-CON) 20 MEQ tablet Take 20 mEq by mouth daily.    [provider]  rOPINIRole (REQUIP) 2 MG tablet Take 3 tablets by mouth at bedtime. 08/15/17 08/16/18  [provider]  spironolactone (ALDACTONE) 25 MG tablet Take 25 mg by mouth daily. 12/01/17   [provider]  Vitamin D, Ergocalciferol, (DRISDOL) 50000 units CAPS capsule Take 1 capsule by mouth every 14 (fourteen) days. 01/07/18   [provider]    Family History History reviewed. No pertinent family history.  Social History Social History   Tobacco Use  . Smoking status: Never Smoker  . Smokeless tobacco: Never Used    Substance Use Topics  . Alcohol use: Never    Frequency: Never  . Drug use: Never     Allergies   Ciprofloxacin; Penicillins; and Latex   Review of Systems Review of Systems  Constitutional: Positive for fatigue. Negative for chills, diaphoresis and fever.  Respiratory: Negative for chest tightness and shortness of breath.   Cardiovascular: Negative for chest pain and palpitations.  Gastrointestinal: Negative for diarrhea, nausea and vomiting.  Skin: Negative for rash.  Neurological: Positive for weakness, light-headedness and headaches. Negative for dizziness.     Physical Exam Triage Vital Signs ED Triage Vitals  Enc Vitals Group     BP 04/12/18 1339 100/60     Pulse Rate 04/12/18 1339 85     Resp --      Temp 04/12/18 1339 97.9 F (36.6 C)     Temp Source 04/12/18 1339 Oral     SpO2 04/12/18 1339 94 %     Weight --      Height --      Head Circumference --      Peak Flow --      Pain Score 04/12/18 1344 0     Pain Loc --      Pain Edu? --      Excl. in GC? --    No data found.  Updated Vital Signs BP 100/60 (BP Location: Right Arm)   Pulse 85   Temp 97.9 F (36.6 C) (Oral)   SpO2 94%   Visual Acuity Right Eye Distance:   Left Eye Distance:   Bilateral Distance:    Right Eye Near:   Left Eye Near:    Bilateral Near:     Physical Exam  Constitutional: She is oriented to person, place, and time. She appears well-developed and well-nourished. No distress.  Pt sitting in exam chair, appears mildly fatigued but is alert and cooperative during exam.  HENT:  Head: Normocephalic and atraumatic.  Eyes: Conjunctivae are normal. No scleral icterus.  Neck: Normal range of motion. Neck supple.  Cardiovascular: Normal rate, regular rhythm and normal heart sounds.  Pulmonary/Chest: Effort normal and breath sounds normal. No stridor. No respiratory distress. She has no wheezes. She has no rales.  Abdominal: Soft. She exhibits no distension. There is no  tenderness.  Musculoskeletal: Normal range of motion.  Neurological: She is alert and oriented to person, place, and time.  Skin: Skin is warm and dry. Capillary refill takes less than 2 seconds. She is not diaphoretic.  Nursing note and vitals reviewed.    UC Treatments / Results  Labs (all labs ordered are listed, but only abnormal results are displayed) Labs Reviewed  POCT FASTING CBG [facility] MANUAL ENTRY - Abnormal; Notable for the following components:  Result Value   POCT Glucose ([facility]) 600 (*)    All other components within normal limits    EKG None  Radiology No results found.  Procedures Procedures (including critical care time)  Medications Ordered in UC Medications - No data to display  Initial Impression / Assessment and Plan / UC Course  I have reviewed the triage vital signs and the nursing notes.  Pertinent labs & imaging results that were available during my care of the patient were reviewed by me and considered in my medical decision making (see chart for details).     Pt c/o fatigue and weakness with a HA. Hx of hyperglycemia on 03/26/18.  CBG in triage: >600 Advised pt she needs further evaluation and treatment at the hospital. Pt and husband refused EMS transport. Husband plans to drive pt POV to [facility] where she was seen the other week.  Pt stable for discharge and transport POV  Final Clinical Impressions(s) / UC Diagnoses   Final diagnoses:  Weakness  Hyperglycemia   Discharge Instructions   None    ED Prescriptions    None     Controlled Substance Prescriptions [place] Controlled Substance Registry consulted? Not Applicable   Rolla Plate 04/12/18 1413

## 2018-05-11 DIAGNOSIS — R102 Pelvic and perineal pain: Secondary | ICD-10-CM | POA: Insufficient documentation

## 2018-05-14 DIAGNOSIS — I2510 Atherosclerotic heart disease of native coronary artery without angina pectoris: Secondary | ICD-10-CM | POA: Insufficient documentation

## 2018-05-14 DIAGNOSIS — Z955 Presence of coronary angioplasty implant and graft: Secondary | ICD-10-CM | POA: Insufficient documentation

## 2018-05-14 DIAGNOSIS — I252 Old myocardial infarction: Secondary | ICD-10-CM

## 2018-06-25 DIAGNOSIS — E876 Hypokalemia: Secondary | ICD-10-CM | POA: Insufficient documentation

## 2018-06-25 DIAGNOSIS — K4090 Unilateral inguinal hernia, without obstruction or gangrene, not specified as recurrent: Secondary | ICD-10-CM | POA: Insufficient documentation

## 2018-07-06 ENCOUNTER — Telehealth: Payer: Self-pay

## 2018-07-06 NOTE — Telephone Encounter (Signed)
Called pt and informed her of Dr. Leta JunglingPaz's decision. But told her that we have 2 other providers that are accepting new pts. Pt stated that she was going out of town and that she would call back and schedule a NP appt when she gets back.

## 2018-07-06 NOTE — Telephone Encounter (Signed)
Please advise 

## 2018-07-06 NOTE — Telephone Encounter (Signed)
Copied from CRM [id number]. Topic: Appointment Scheduling - Scheduling Inquiry for Clinic >> Jul 06, 2018 11:15 AM Maia Pettiesrtiz, Kristie S wrote: Reason for CRM: [facility] referred to Dr. Drue NovelPaz as a PCP/Internal Med provider. Pt states that she has been trying to get in for 3 months. Pt asking if he can please take one more patient. [facility]. Please advise.

## 2018-07-06 NOTE — Telephone Encounter (Signed)
Please inform Pt of Dr. Paz decision. Thank you.  

## 2018-07-06 NOTE — Telephone Encounter (Signed)
Unable to take new pts, other providers do

## 2018-08-27 ENCOUNTER — Encounter: Payer: Self-pay | Admitting: Medical

## 2018-08-27 ENCOUNTER — Ambulatory Visit: Payer: Medicare Other | Admitting: Medical

## 2018-08-27 VITALS — BP 153/63 | HR 85 | Temp 98.1°F | Resp 16 | Ht 59.0 in | Wt 209.4 lb

## 2018-08-27 DIAGNOSIS — M797 Fibromyalgia: Secondary | ICD-10-CM | POA: Diagnosis not present

## 2018-08-27 DIAGNOSIS — I10 Essential (primary) hypertension: Secondary | ICD-10-CM | POA: Diagnosis not present

## 2018-08-27 DIAGNOSIS — L089 Local infection of the skin and subcutaneous tissue, unspecified: Secondary | ICD-10-CM

## 2018-08-27 DIAGNOSIS — F329 Major depressive disorder, single episode, unspecified: Secondary | ICD-10-CM

## 2018-08-27 DIAGNOSIS — G2581 Restless legs syndrome: Secondary | ICD-10-CM

## 2018-08-27 DIAGNOSIS — L989 Disorder of the skin and subcutaneous tissue, unspecified: Secondary | ICD-10-CM

## 2018-08-27 DIAGNOSIS — F32A Depression, unspecified: Secondary | ICD-10-CM

## 2018-08-27 DIAGNOSIS — F419 Anxiety disorder, unspecified: Secondary | ICD-10-CM | POA: Diagnosis not present

## 2018-08-27 DIAGNOSIS — E7800 Pure hypercholesterolemia, unspecified: Secondary | ICD-10-CM

## 2018-08-27 MED ORDER — DOXYCYCLINE HYCLATE 100 MG PO TABS
100.0000 mg | ORAL_TABLET | Freq: Two times a day (BID) | ORAL | 0 refills | Status: DC
Start: 2018-08-27 — End: 2018-11-02

## 2018-08-27 NOTE — Progress Notes (Signed)
Subjective:    Patient ID: Valerie Keller, female    DOB: 12-15-1945, 72 y.o.   MRN: [id number]  HPI  Pt in for first time.  Pt new to area for 6 months.   Was living in [place] before.   Pt states she needs a psychiatrist. She states for years got clonazepam from family practice. Over last year has had some sporadic under psychiatrist in [place]. Pt has been trying to get referral but has trying to be seen by specialist in past. Pt describes various MD in [place] have tried to make modifications but nothing has worked.  Pt also has seen MD psychiatrist at [facility] but med changes did not help. Pt has been seen by Dr. Eldridge Dace.  Pt states former pcp locally had referred her to urologist. Who had referred her to general surgeon Dr. Andrey Campanile. She explains lump near groin and she may need to eventually see plastic surgeon.  Pt has been on clonazpeam  2 mg  po bid. She states had at one time in past had been on 6 mg a day. She has been on benzo for years. She is aware should be taking less as is willing to taper in future.  Pt is taking taking requip 4 mg requip at night.   Rt ear mild tender area outside entry. Pain for 6 months.   Pt is on cymbalta.1 tab po bid.(has fibromyaglia)  She has history of high cholesterol. On liptior.  Has htn and on coreg.   Has diabetes. On metformin. Last a1c one month ago 7.7.  Hx of cad. Hx of MI.    Review of Systems  Constitutional: Negative for chills, fatigue and fever.  HENT: Positive for ear pain. Negative for nosebleeds, sinus pressure, sinus pain, sneezing, sore throat and voice change.   Respiratory: Negative for cough, chest tightness, shortness of breath and wheezing.   Cardiovascular: Negative for chest pain and palpitations.  Gastrointestinal: Negative for abdominal pain.  Genitourinary: Negative for dysuria, flank pain, hematuria and urgency.  Musculoskeletal: Negative for back pain and neck pain.       Restless leg syndrome.  Skin: Negative  for rash.  Neurological: Negative for dizziness, speech difficulty, weakness and headaches.  Hematological: Negative for adenopathy. Does not bruise/bleed easily.  Psychiatric/Behavioral: Positive for dysphoric mood. Negative for behavioral problems, confusion and suicidal ideas. The patient is nervous/anxious.      Past Medical History:  Diagnosis Date  . Diabetes mellitus without complication (HCC)   . Fibromyalgia   . Lung disease   . Myocardial infarction (HCC)   . Renal disorder      Social History   Socioeconomic History  . Marital status: Married    Spouse name: Not on file  . Number of children: Not on file  . Years of education: Not on file  . Highest education level: Not on file  Occupational History  . Not on file  Social Needs  . Financial resource strain: Not on file  . Food insecurity:    Worry: Not on file    Inability: Not on file  . Transportation needs:    Medical: Not on file    Non-medical: Not on file  Tobacco Use  . Smoking status: Never Smoker  . Smokeless tobacco: Never Used  Substance and Sexual Activity  . Alcohol use: Never    Frequency: Never  . Drug use: Never  . Sexual activity: Not on file  Lifestyle  . Physical activity:  Days per week: Not on file    Minutes per session: Not on file  . Stress: Not on file  Relationships  . Social connections:    Talks on phone: Not on file    Gets together: Not on file    Attends religious service: Not on file    Active member of club or organization: Not on file    Attends meetings of clubs or organizations: Not on file    Relationship status: Not on file  . Intimate partner violence:    Fear of current or ex partner: Not on file    Emotionally abused: Not on file    Physically abused: Not on file    Forced sexual activity: Not on file  Other Topics Concern  . Not on file  Social History Narrative  . Not on file    Past Surgical History:  Procedure Laterality Date  . ABDOMINAL  HYSTERECTOMY    . CHOLECYSTECTOMY    . CORONARY STENT PLACEMENT     x3  . FOOT SURGERY    . KIDNEY SURGERY    . TONSILLECTOMY      No family history on file.  Allergies  Allergen Reactions  . Ciprofloxacin Anaphylaxis and Hives  . Morphine Itching and Rash  . Penicillins Anaphylaxis  . Latex Hives and Other (See Comments)    Other reaction(s): Other (See Comments)  . Promethazine Other (See Comments)    Current Outpatient Medications on File Prior to Visit  Medication Sig Dispense Refill  . Alpha-Lipoic Acid 300 MG CAPS Take 1 capsule by mouth daily.    Marland Kitchen aspirin 81 MG tablet Take 81 mg by mouth daily.    Marland Kitchen atorvastatin (LIPITOR) 40 MG tablet Take 40 mg by mouth at bedtime.    . carvedilol (COREG) 6.25 MG tablet Take 6.25 mg by mouth 2 (two) times daily.    . clonazePAM (KLONOPIN) 2 MG tablet Take 1-2 tablets by mouth 2 (two) times daily. 1 tablet in the AM and 2 tablets in the evening    . DULoxetine (CYMBALTA) 60 MG capsule Take 2 capsules by mouth daily.    . fluticasone (FLONASE ALLERGY RELIEF) 50 MCG/ACT nasal spray Place 2 sprays into the nose daily as needed for allergies.    . furosemide (LASIX) 40 MG tablet Take 80 mg by mouth daily.    Marland Kitchen levOCARNitine L-Tartrate (L-CARNITINE) 500 MG CAPS Take 500 mg by mouth daily.    . Magnesium Oxide 400 (240 Mg) MG TABS Take 1-2 tablets by mouth 2 (two) times daily. 1 tablet in the AM and 2 tablets in the evening    . metFORMIN (GLUCOPHAGE) 850 MG tablet Take 0.5 tablets by mouth 2 (two) times daily.    . Multiple Vitamin (MULTIVITAMIN) capsule Take 1 capsule by mouth daily.    . potassium chloride SA (K-DUR,KLOR-CON) 20 MEQ tablet Take 20 mEq by mouth daily.    Marland Kitchen spironolactone (ALDACTONE) 25 MG tablet Take 25 mg by mouth daily.    . Vitamin D, Ergocalciferol, (DRISDOL) 50000 units CAPS capsule Take 1 capsule by mouth every 14 (fourteen) days.    Marland Kitchen albuterol (PROVENTIL) (2.5 MG/3ML) 0.083% nebulizer solution Take 3 mLs by  nebulization every 4 (four) hours as needed for shortness of breath.    . nitroGLYCERIN (NITROSTAT) 0.4 MG SL tablet PLACE 1 TABLET UNDER TONGUE EVERY 5 MIN UP TO 3 DOSES AS NEEDED FOR CHEST PAIN.CALL 911 IF PERSISTS    . rOPINIRole (REQUIP) 2  MG tablet Take 3 tablets by mouth at bedtime.     No current facility-administered medications on file prior to visit.     There were no vitals taken for this visit.      Objective:   Physical Exam  General Mental Status- Alert. General Appearance- Not in acute distress.   Skin General: Color- Normal Color. Moisture- Normal Moisture.  Neck Carotid Arteries- Normal color. Moisture- Normal Moisture. No carotid bruits. No JVD.  Chest and Lung Exam Auscultation: Breath Sounds:-Normal.  Cardiovascular Auscultation:Rythm- Regular. Murmurs & Other Heart Sounds:Auscultation of the heart reveals- No Murmurs.  Abdomen Inspection:-Inspeection Normal. Palpation/Percussion:Note:No mass. Palpation and Percussion of the abdomen reveal- Non Tender, Non Distended + BS, no rebound or guarding.  Neurologic Cranial Nerve exam:- CN III-XII intact(No nystagmus), symmetric smile. Strength:- 5/5 equal and symmetric strength both upper and lower extremities.  Rt ear- canal clear, and tm normal. Small hypopigmented area just adjacent to ear entrance. Faint tender to palpation.      Assessment & Plan:  For history of hypertension, continue current BP medication.  For high cholesterol, continue current medication as well.  For anxiety, you are on high-dose clonazepam.  Continue current dosing.  Future psychiatrist will likely taper you down to lower dose.  I gave you a list of psychiatrist.  If you would please go ahead and make contact with psychiatrist office and notify me which office you are being scheduled with.  Try to get old records from [facility] as well as do when you were admitted for 9 days.  This will be helpful for new psychiatrist.  In addition  sign release form upfront so we can get your records from my practice in [place].  For fibromyalgia and for help with your mood, continue Cymbalta.  For restless leg syndrome, continue with current dose of Requip.  For possible skin infection right ear cartilage region, want to start doxycycline.  Rx advisement given.  We need to recheck this area in 2 weeks.  Area is still slightly discolored and still tender then will refer you to ENT for evaluation.  If any severe changes in mood and anxiety as described then recommend [facility], ED for psychiatric evaluation.  Follow-up in 2 weeks or as needed.   45 minute spent with pt. 50% of time spent counseling on diagnosis and treatment plan. Particularly for anxiety since using very high doses of clonazepam. Wanted her to understand approach to taper and how will need to get her in with specialist. Also discussed requip dosing.  Esperanza Richters, PA-C

## 2018-08-27 NOTE — Patient Instructions (Addendum)
For history of hypertension, continue current BP medication.  For high cholesterol, continue current medication as well.  For anxiety, you are on high-dose clonazepam.  Continue current dosing.  Future psychiatrist will likely taper you down to lower dose.  I gave you a list of psychiatrist.  If you would please go ahead and make contact with psychiatrist office and notify me which office you are being scheduled with.  Try to get old records from [facility] as well as do when you were admitted for 9 days.  This will be helpful for new psychiatrist.  In addition sign release form upfront so we can get your records from my practice in [place].  For fibromyalgia and for help with your mood, continue Cymbalta.  For restless leg syndrome, continue with current dose of Requip.  For possible skin infection right ear cartilage region, want to start doxycycline.  Rx advisement given.  We need to recheck this area in 2 weeks.  Area is still slightly discolored and still tender then will refer you to ENT for evaluation.  If any severe changes in mood and anxiety as described then recommend [facility], ED for psychiatric evaluation.  Follow-up in 2 weeks or as needed.

## 2018-08-28 ENCOUNTER — Telehealth: Payer: Self-pay | Admitting: Medical

## 2018-08-28 DIAGNOSIS — E876 Hypokalemia: Secondary | ICD-10-CM

## 2018-08-28 LAB — CBC WITH DIFFERENTIAL/PLATELET
BASOS PCT: 0.7 %
Basophils Absolute: 64 cells/uL (ref 0–200)
EOS ABS: 273 {cells}/uL (ref 15–500)
Eosinophils Relative: 3 %
HEMATOCRIT: 47.4 % — AB (ref 35.0–45.0)
HEMOGLOBIN: 16.7 g/dL — AB (ref 11.7–15.5)
LYMPHS ABS: 2785 {cells}/uL (ref 850–3900)
MCH: 32.7 pg (ref 27.0–33.0)
MCHC: 35.2 g/dL (ref 32.0–36.0)
MCV: 92.9 fL (ref 80.0–100.0)
MPV: 10.4 fL (ref 7.5–12.5)
Monocytes Relative: 7.9 %
NEUTROS ABS: 5260 {cells}/uL (ref 1500–7800)
Neutrophils Relative %: 57.8 %
Platelets: 261 10*3/uL (ref 140–400)
RBC: 5.1 10*6/uL (ref 3.80–5.10)
RDW: 12.8 % (ref 11.0–15.0)
Total Lymphocyte: 30.6 %
WBC: 9.1 10*3/uL (ref 3.8–10.8)
WBCMIX: 719 {cells}/uL (ref 200–950)

## 2018-08-28 LAB — COMPREHENSIVE METABOLIC PANEL
AG Ratio: 1.3 (calc) (ref 1.0–2.5)
ALKALINE PHOSPHATASE (APISO): 85 U/L (ref 33–130)
ALT: 24 U/L (ref 6–29)
AST: 24 U/L (ref 10–35)
Albumin: 4 g/dL (ref 3.6–5.1)
BILIRUBIN TOTAL: 0.7 mg/dL (ref 0.2–1.2)
BUN/Creatinine Ratio: 15 (calc) (ref 6–22)
BUN: 17 mg/dL (ref 7–25)
CALCIUM: 9.9 mg/dL (ref 8.6–10.4)
CO2: 38 mmol/L — ABNORMAL HIGH (ref 20–32)
Chloride: 93 mmol/L — ABNORMAL LOW (ref 98–110)
Creat: 1.13 mg/dL — ABNORMAL HIGH (ref 0.60–0.93)
Globulin: 3 g/dL (calc) (ref 1.9–3.7)
Glucose, Bld: 126 mg/dL — ABNORMAL HIGH (ref 65–99)
Potassium: 3 mmol/L — ABNORMAL LOW (ref 3.5–5.3)
Sodium: 142 mmol/L (ref 135–146)
Total Protein: 7 g/dL (ref 6.1–8.1)

## 2018-08-28 NOTE — Telephone Encounter (Signed)
opened to review. 

## 2018-08-28 NOTE — Telephone Encounter (Signed)
Very important that we run narx report on her. Will you please print that for me.

## 2018-08-28 NOTE — Telephone Encounter (Signed)
Future cmp order placed. 

## 2018-08-30 NOTE — Telephone Encounter (Signed)
Printed and placed on desk

## 2018-09-06 ENCOUNTER — Other Ambulatory Visit: Payer: Medicare Other

## 2018-09-13 NOTE — Progress Notes (Signed)
Reviewed  Yvonne R Lowne Chase, DO  

## 2018-09-21 ENCOUNTER — Ambulatory Visit: Payer: Medicare Other | Admitting: Osteopathic Medicine

## 2018-10-05 ENCOUNTER — Ambulatory Visit: Payer: Medicare Other | Admitting: Osteopathic Medicine

## 2018-10-05 ENCOUNTER — Telehealth: Payer: Self-pay | Admitting: Osteopathic Medicine

## 2018-10-05 NOTE — Telephone Encounter (Signed)
Called patient and let him know of the information below and about our policy. He will let wife know as well. Patient states that he understands and will try to keep appointment for January.  °

## 2018-10-05 NOTE — Telephone Encounter (Signed)
Patient and I believe their spouse have cancelled same-day twice for new patient visit - technically this counts as two no-shows for new patient and we shouldn't see them at all but will give them one more opportunity.   If they no-show or late cancel again same day they will NOT BE SEEN AT THIS OFFICE.  Please call patient and inform them of this policy (also sent phone note for husband, whichever you get to first!)

## 2018-10-07 DIAGNOSIS — E66813 Obesity, class 3: Secondary | ICD-10-CM | POA: Insufficient documentation

## 2018-10-26 ENCOUNTER — Ambulatory Visit (INDEPENDENT_AMBULATORY_CARE_PROVIDER_SITE_OTHER): Payer: Medicare Other | Admitting: Osteopathic Medicine

## 2018-10-26 ENCOUNTER — Encounter: Payer: Self-pay | Admitting: Osteopathic Medicine

## 2018-10-26 VITALS — BP 143/79 | HR 80 | Temp 97.4°F | Ht 59.0 in | Wt 210.4 lb

## 2018-10-26 DIAGNOSIS — I10 Essential (primary) hypertension: Secondary | ICD-10-CM | POA: Diagnosis not present

## 2018-10-26 DIAGNOSIS — G4733 Obstructive sleep apnea (adult) (pediatric): Secondary | ICD-10-CM

## 2018-10-26 DIAGNOSIS — I519 Heart disease, unspecified: Secondary | ICD-10-CM

## 2018-10-26 DIAGNOSIS — I209 Angina pectoris, unspecified: Secondary | ICD-10-CM

## 2018-10-26 DIAGNOSIS — J449 Chronic obstructive pulmonary disease, unspecified: Secondary | ICD-10-CM

## 2018-10-26 DIAGNOSIS — Z794 Long term (current) use of insulin: Secondary | ICD-10-CM

## 2018-10-26 DIAGNOSIS — R222 Localized swelling, mass and lump, trunk: Secondary | ICD-10-CM

## 2018-10-26 DIAGNOSIS — N183 Chronic kidney disease, stage 3 unspecified: Secondary | ICD-10-CM

## 2018-10-26 DIAGNOSIS — I2510 Atherosclerotic heart disease of native coronary artery without angina pectoris: Secondary | ICD-10-CM | POA: Diagnosis not present

## 2018-10-26 DIAGNOSIS — I255 Ischemic cardiomyopathy: Secondary | ICD-10-CM | POA: Diagnosis not present

## 2018-10-26 DIAGNOSIS — K219 Gastro-esophageal reflux disease without esophagitis: Secondary | ICD-10-CM

## 2018-10-26 DIAGNOSIS — K4090 Unilateral inguinal hernia, without obstruction or gangrene, not specified as recurrent: Secondary | ICD-10-CM

## 2018-10-26 DIAGNOSIS — E1165 Type 2 diabetes mellitus with hyperglycemia: Secondary | ICD-10-CM

## 2018-10-26 DIAGNOSIS — I252 Old myocardial infarction: Secondary | ICD-10-CM

## 2018-10-26 NOTE — Patient Instructions (Addendum)
Plan:  Will get medications into the system so they'll be ready to refill. When you are in need of refills, please ask your pharmacy to contact us but let us know if there are any issues with this process.   Please continue to see your psychiatrist as directed.   Referrals were placed to the following specialists: Cardiology (heart) Nephrology (kidneys) General Surgery   Can see me for diabetes follow-up   Will get labs today, you're due for A1C recheck in 2-3 months

## 2018-10-26 NOTE — Progress Notes (Signed)
HPI: Valerie Keller is a 73 y.o. female who  has a past medical history of Diabetes mellitus without complication (HCC), Fibromyalgia, Lung disease, Myocardial infarction (HCC), and Renal disorder.  she presents to [facility] today, 10/26/18,  for chief complaint of: Establish care Extensive (+)ROS, see headings below - pt reports all these are chronic Would like spot of skin on her scalp checked out Would like ears checked out  Patient and her husband have recently relocated to [facility], previously in [place] and went away.  Patient has seen several specialists in the area as well as primary care but was not particularly happy with the [facility] system so she has transferred here today to get everything under the [facility].   CARDIOVASCULAR Hx MI, HTN, CHF (+)Chest pain and pressure, history of angina Seeing cardiology but would like to switch to [facility] provider Per Dr Gevena Barre records, should be on ASA 81, carvedilol 6.25 mg bid, furosemide 40 mg 2 tabs daily AM, isosorbide 30 mg, Crestor 40 mg.  Patient is also taking nitroglycerin as needed, she has been taking spironolactone 25 mg daily though this is not on records from recent cardiology visit, her previous cardiologist in [place] was prescribing this.  RESPIRATORY (+)trouble breathing, productive cough - chronic. Former smoker, quit about 30 years ago, 1/2 ppd x 17 yrs Seeing pulmonology in [place], patient reports that she is on oxygen at home "as needed".  She also uses albuterol prn (neb and HFA), combivent daily, she feels her breathing is pretty well controlled on these therapies.  She would rather not establish with a specialist if not absolutely needed.  NEUROLOGICAL (+)weakness and dizziness, chronic, mild. Hisotyr of RLS, taking requip 3 mg - taking 3 pills/9 mg qhs  PSYCHIATRIC (+)anx/dep, sleep problems, mood swings.  Seeing psychiatry - hx Bipolar I disorder.  She is  happy with the specialist and does not want to change providers. Gligorovic, Marya Landry, MD  [address]  [place], [place] [address]  [phone]  [phone] (Fax)  Taking Clonazepam 2 mg tid, Cymbalta 120 mg daily.  Patient is also taking Abilify, has been titrating up on this, currently has 10 mg daily.  RENAL Hx kidney disease, CLD3 per records. Was seeing Nephrology in [place] 05/2017.  Would like to establish with specialist here.  UROLOGY Seeing urology -has a confusing history here.  She says that she was initially referred for pelvic mass, reports painful lump anterior mons pubis area, has been present for >1 year.  On record review though it looks like she was actually referred to urology to evaluate mass found incidentally on CT scan.  See results from CT scan below.  There is no mention of an adrenal mass, however the plan from urology based on that note was CT in 1 year adrenal protocol to make sure something is not growing "F/U in 1 year with CT adrenal protocol to ensure adrenal adenoma does not increase in size"  Ct Abdomen Pelvis W Iv Contrast Result Date: 06/25/2018 CT ABDOMEN AND PELVIS WITH CONTRAST INDICATION: Abdominal pain, unspecified COMPARISON: March 26, 2018 ...   Impression: 1. Cholecystectomy. 2. Bilateral kidney stones and cysts. There is no hydronephrosis. 3. No acute bowel abnormality. 4. Hysterectomy. 5. Stable fractures of L4, T10 and T12. Electronically Signed by: Ainsley Spinner  REPRODUCTIVE No problems  S/p hysterectomy  GASTROINTESTINAL (+)nausea (+)constipation  (+)GERD S/p cholecystectomy Reports last colonoscopy in [place]   ENDOCRINE Hx DM2, seeing endocrinology -  08/2018 [facility]  [address]  [place], [place] [address]  [phone]   From most recent visit, A1C 8.7% "Current diabetes therapy consists of: Lantus 30 units qhs, Humalog 5 units ac tid, Metformin 500mg  qd (cannot tolerate  more). She tried Bydureon but was unable to tolerate..." started 7 units Humalog tid pre-meal Hx adrenal adenoma, hx non-obstructing renal stones, following with urology    MUSCULOSKELETAL/RHEUM (+)muscle, joint, back, neck pain (+)old injury:  Hx RLS, has been managed by psychiatry, see above regarding dose of Requip.  HEENT (+)headache -on and off, chronic, no recent change. (+)sore throat -reports getting over a cold S/p tonsillectomy   SKIN (+)easy bruising  (+)rash, itching, (+)hair problem -would like me to check out a few spots on her scalp that have been itching her for several months.  No rash that she can tell, no lumps/bumps, scabs, drainage.        Past medical, surgical, social and family history reviewed:  There are no active problems to display for this patient.   Past Surgical History:  Procedure Laterality Date  . ABDOMINAL HYSTERECTOMY    . CHOLECYSTECTOMY    . CORONARY STENT PLACEMENT     x3  . FOOT SURGERY    . KIDNEY SURGERY    . TONSILLECTOMY      Social History   Tobacco Use  . Smoking status: Never Smoker  . Smokeless tobacco: Never Used  Substance Use Topics  . Alcohol use: Never    Frequency: Never    No family history on file.   Current medication list and allergy/intolerance information reviewed:    Current Outpatient Medications  Medication Sig Dispense Refill  . albuterol (PROVENTIL) (2.5 MG/3ML) 0.083% nebulizer solution Take 3 mLs by nebulization every 4 (four) hours as needed for shortness of breath.    . Alpha-Lipoic Acid 300 MG CAPS Take 1 capsule by mouth daily.    Marland Kitchen aspirin 81 MG tablet Take 81 mg by mouth daily.    Marland Kitchen atorvastatin (LIPITOR) 40 MG tablet Take 40 mg by mouth at bedtime.    . carvedilol (COREG) 6.25 MG tablet Take 6.25 mg by mouth 2 (two) times daily.    . clonazePAM (KLONOPIN) 2 MG tablet Take 1-2 tablets by mouth 2 (two) times daily. 1 tablet in the AM and 2 tablets in the evening    . doxycycline  (VIBRA-TABS) 100 MG tablet Take 1 tablet (100 mg total) by mouth 2 (two) times daily. Can give caps or generic. 14 tablet 0  . DULoxetine (CYMBALTA) 60 MG capsule Take 2 capsules by mouth daily.    . fluticasone (FLONASE ALLERGY RELIEF) 50 MCG/ACT nasal spray Place 2 sprays into the nose daily as needed for allergies.    . furosemide (LASIX) 40 MG tablet Take 80 mg by mouth daily.    Marland Kitchen levOCARNitine L-Tartrate (L-CARNITINE) 500 MG CAPS Take 500 mg by mouth daily.    . Magnesium Oxide 400 (240 Mg) MG TABS Take 1-2 tablets by mouth 2 (two) times daily. 1 tablet in the AM and 2 tablets in the evening    . metFORMIN (GLUCOPHAGE) 850 MG tablet Take 0.5 tablets by mouth 2 (two) times daily.    . Multiple Vitamin (MULTIVITAMIN) capsule Take 1 capsule by mouth daily.    . nitroGLYCERIN (NITROSTAT) 0.4 MG SL tablet PLACE 1 TABLET UNDER TONGUE EVERY 5 MIN UP TO 3 DOSES AS NEEDED FOR CHEST PAIN.CALL 911 IF PERSISTS    .  potassium chloride SA (K-DUR,KLOR-CON) 20 MEQ tablet Take 20 mEq by mouth daily.    Marland Kitchen rOPINIRole (REQUIP) 2 MG tablet Take 3 tablets by mouth at bedtime.    Marland Kitchen spironolactone (ALDACTONE) 25 MG tablet Take 25 mg by mouth daily.    . Vitamin D, Ergocalciferol, (DRISDOL) 50000 units CAPS capsule Take 1 capsule by mouth every 14 (fourteen) days.     No current facility-administered medications for this visit.     Allergies  Allergen Reactions  . Ciprofloxacin Anaphylaxis and Hives  . Morphine Itching and Rash  . Penicillins Anaphylaxis  . Latex Hives and Other (See Comments)    Other reaction(s): Other (See Comments)  . Promethazine Other (See Comments)      Review of Systems:  Constitutional:  No  fever, no chills, No recent illness, No unintentional weight changes. +significant fatigue.   HEENT: +headache, no vision change, no hearing change, +sore throat, + sinus pressure  Cardiac: +chest pain, +pressure, No palpitations, No  Orthopnea  Respiratory:  +shortness of breath.  +Cough  Gastrointestinal: +abdominal pain, +nausea, No  vomiting,  No  blood in stool, No  diarrhea, +constipation   Musculoskeletal: +myalgia/arthralgia  Skin: No  Rash, +other wounds/concerning lesions  Genitourinary: +incontinence, No  abnormal genital bleeding, No abnormal genital discharge  Hem/Onc: No  easy bruising/bleeding, +abnormal bump  Endocrine: +cold intolerance,  +heat intolerance. No polyuria/polydipsia/polyphagia   Neurologic: +weakness, +dizziness, No  slurred speech/focal weakness/facial droop  Psychiatric: +concerns with depression, +concerns with anxiety, +sleep problems, No mood problems  Exam:  BP (!) 143/79 (BP Location: Left Arm, Patient Position: Sitting, Cuff Size: Normal)   Pulse 80   Temp (!) 97.4 F (36.3 C) (Oral)   Ht 4\' 11"  (1.499 m)   Wt 210 lb 6.4 oz (95.4 kg)   BMI 42.50 kg/m   Constitutional: VS see above. General Appearance: alert, well-developed, obese, NAD  Eyes: Normal lids and conjunctive, non-icteric sclera  Ears, Nose, Mouth, Throat: MMM, Normal external inspection ears/nares/mouth/lips/gums. TM normal bilaterally. Pharynx/tonsils no erythema, no exudate. Nasal mucosa normal.   Neck: No masses, trachea midline. No thyroid enlargement. No tenderness/mass appreciated. No lymphadenopathy  Respiratory: Normal respiratory effort. no wheeze, no rhonchi, no rales  Cardiovascular: S1/S2 normal, no murmur, no rub/gallop auscultated. RRR. No lower extremity edema.  Gastrointestinal: habitus limits exam.   Musculoskeletal: Gait normal. No clubbing/cyanosis of digits.   Neurological: Normal balance/coordination. No tremor. No cranial nerve deficit on limited exam. Motor and sensation intact and symmetric. Cerebellar reflexes intact.   Skin: warm, dry, intact. No rash/ulcer. No concerning nevi or subq nodules on limited exam. ?large lipoma or other mass mons pubis     Psychiatric: Normal judgment/insight. Normal mood and affect. Oriented  x3.        ASSESSMENT/PLAN: The primary encounter diagnosis was Angina pectoris (HCC). Diagnoses of Cardiomyopathy, ischemic, Coronary artery disease with history of myocardial infarction without history of CABG, Essential hypertension, Left ventricular diastolic dysfunction, Chronic obstructive pulmonary disease, unspecified COPD type (HCC), Obstructive sleep apnea (adult) (pediatric), Gastro-esophageal reflux disease without esophagitis, Type 2 diabetes mellitus with hyperglycemia, with long-term current use of insulin (HCC), Chronic kidney disease, stage 3 (moderate) (HCC), Inguinal hernia without obstruction or gangrene, recurrence not specified, unspecified laterality, and Subcutaneous mass of abdominal wall were also pertinent to this visit.   Orders Placed This Encounter  Procedures  . CBC  . COMPLETE METABOLIC PANEL WITH GFR  . TSH  . Magnesium  . Ambulatory referral to Nephrology  .  Ambulatory referral to Cardiology  . Ambulatory referral to General Surgery    Meds ordered this encounter  Medications  . albuterol (PROVENTIL) (2.5 MG/3ML) 0.083% nebulizer solution    Sig: Take 3 mLs by nebulization every 4 (four) hours as needed for shortness of breath.    Dispense:  75 mL    Refill:  99  . aspirin 81 MG tablet    Sig: Take 1 tablet (81 mg total) by mouth daily.    Dispense:  90 tablet    Refill:  3  . atorvastatin (LIPITOR) 40 MG tablet    Sig: Take 1 tablet (40 mg total) by mouth at bedtime.    Dispense:  90 tablet    Refill:  3  . carvedilol (COREG) 6.25 MG tablet    Sig: Take 1 tablet (6.25 mg total) by mouth 2 (two) times daily.    Dispense:  180 tablet    Refill:  3  . furosemide (LASIX) 40 MG tablet    Sig: Take 2 tablets (80 mg total) by mouth daily.    Dispense:  180 tablet    Refill:  3  . Ipratropium-Albuterol (COMBIVENT RESPIMAT) 20-100 MCG/ACT AERS respimat    Sig: Inhale 1 puff into the lungs every 6 (six) hours as needed for wheezing.    Dispense:   16 g    Refill:  3  . metFORMIN (GLUCOPHAGE) 850 MG tablet    Sig: Take 0.5 tablets (425 mg total) by mouth 2 (two) times daily.    Dispense:  180 tablet    Refill:  3  . nitroGLYCERIN (NITROSTAT) 0.4 MG SL tablet    Sig: Place 1 tablet (0.4 mg total) under the tongue every 5 (five) minutes as needed for chest pain.    Dispense:  30 tablet    Refill:  3  . spironolactone (ALDACTONE) 25 MG tablet    Sig: Take 1 tablet (25 mg total) by mouth daily.    Dispense:  90 tablet    Refill:  1  . Vitamin D, Ergocalciferol, (DRISDOL) 1.25 MG (50000 UT) CAPS capsule    Sig: Take 1 capsule (50,000 Units total) by mouth every 14 (fourteen) days.    Dispense:  12 capsule    Refill:  0    Patient Instructions  Plan:  Will get medications into the system so they'll be ready to refill. When you are in need of refills, please ask your pharmacy to contact us but let us know if there are any issues with this process.   Please continue to see your psychiatrist as directed.   Referrals were placed to the following specialists: Cardiology (heart) Nephrology (kidneys) General Surgery   Can see me for diabetes follow-up   Will get labs today, you're due for A1C recheck in 2-3 months        Visit summary with medication list and pertinent instructions was printed for patient to review. All questions at time of visit were answered - patient instructed to contact office with any additional concerns or updates. ER/RTC precautions were reviewed with the patient.   Note: Total time spent 60 minutes, greater than 50% of the visit was spent face-to-face counseling and coordinating care for the above diagnoses listed in assessment/plan.   Please note: voice recognition software was used to produce this document, and typos may escape review. Please contact Dr. Lyn HollingsheadAlexander for any needed clarifications.     Follow-up plan: Return in about 2 months (around 12/25/2018) for recheck  A1C (FYI to [facility]  OV40).

## 2018-10-27 ENCOUNTER — Telehealth: Payer: Self-pay

## 2018-10-27 MED ORDER — PEN NEEDLES 31G X 8 MM MISC: 99 refills | Status: AC

## 2018-10-27 NOTE — Telephone Encounter (Signed)
Ok sent!

## 2018-10-27 NOTE — Telephone Encounter (Signed)
Valerie Keller called requesting a rx for insulin needles. Pt is injecting 4 times a day daily. New rx. Pls send to [facility] pharmacy. Thanks.

## 2018-10-28 NOTE — Telephone Encounter (Signed)
Patient called wondering why we called her, advised of message below from [place]. No further questions as this time.

## 2018-10-28 NOTE — Telephone Encounter (Signed)
Thanks for the update

## 2018-10-28 NOTE — Telephone Encounter (Signed)
Left a detailed vm msg for pt regarding insulin needles rx sent to local pharmacy. Direct call back info provided.

## 2018-11-02 MED ORDER — METFORMIN HCL 850 MG PO TABS: 425.0000 mg | ORAL_TABLET | Freq: Two times a day (BID) | ORAL | 3 refills | Status: DC

## 2018-11-02 MED ORDER — ATORVASTATIN CALCIUM 40 MG PO TABS
40.0000 mg | ORAL_TABLET | Freq: Every day | ORAL | 3 refills | Status: DC
Start: 2018-11-02 — End: 2019-06-29

## 2018-11-02 MED ORDER — IPRATROPIUM-ALBUTEROL 20-100 MCG/ACT IN AERS: 1.0000 | INHALATION_SPRAY | Freq: Four times a day (QID) | RESPIRATORY_TRACT | 3 refills | Status: DC | PRN

## 2018-11-02 MED ORDER — ASPIRIN 81 MG PO TABS: 81.0000 mg | ORAL_TABLET | Freq: Every day | ORAL | 3 refills | Status: DC

## 2018-11-02 MED ORDER — SPIRONOLACTONE 25 MG PO TABS
25.0000 mg | ORAL_TABLET | Freq: Every day | ORAL | 1 refills | Status: DC
Start: 2018-11-02 — End: 2019-02-28

## 2018-11-02 MED ORDER — VITAMIN D (ERGOCALCIFEROL) 1.25 MG (50000 UNIT) PO CAPS: 50000.0000 [IU] | ORAL_CAPSULE | ORAL | 0 refills | Status: DC

## 2018-11-02 MED ORDER — ALBUTEROL SULFATE (2.5 MG/3ML) 0.083% IN NEBU: 3.0000 mL | INHALATION_SOLUTION | RESPIRATORY_TRACT | 99 refills | Status: DC | PRN

## 2018-11-02 MED ORDER — NITROGLYCERIN 0.4 MG SL SUBL: 0.4000 mg | SUBLINGUAL_TABLET | SUBLINGUAL | 3 refills | Status: DC | PRN

## 2018-11-02 MED ORDER — CARVEDILOL 6.25 MG PO TABS: 6.2500 mg | ORAL_TABLET | Freq: Two times a day (BID) | ORAL | 3 refills | Status: DC

## 2018-11-02 MED ORDER — FUROSEMIDE 40 MG PO TABS: 80.0000 mg | ORAL_TABLET | Freq: Every day | ORAL | 3 refills | Status: DC

## 2018-11-10 ENCOUNTER — Emergency Department (INDEPENDENT_AMBULATORY_CARE_PROVIDER_SITE_OTHER): Payer: Medicare Other

## 2018-11-10 ENCOUNTER — Emergency Department (INDEPENDENT_AMBULATORY_CARE_PROVIDER_SITE_OTHER)
Admission: EM | Admit: 2018-11-10 | Discharge: 2018-11-10 | Disposition: A | Discharge status: Home / Self Care | Payer: Medicare Other | Source: Home / Self Care | Attending: Family Medicine | Admitting: Family Medicine

## 2018-11-10 ENCOUNTER — Encounter: Payer: Self-pay | Admitting: *Deleted

## 2018-11-10 DIAGNOSIS — M542 Cervicalgia: Secondary | ICD-10-CM | POA: Diagnosis not present

## 2018-11-10 DIAGNOSIS — R51 Headache: Secondary | ICD-10-CM | POA: Diagnosis not present

## 2018-11-10 DIAGNOSIS — W010XXA Fall on same level from slipping, tripping and stumbling without subsequent striking against object, initial encounter: Secondary | ICD-10-CM

## 2018-11-10 DIAGNOSIS — G44309 Post-traumatic headache, unspecified, not intractable: Secondary | ICD-10-CM | POA: Diagnosis not present

## 2018-11-10 DIAGNOSIS — M549 Dorsalgia, unspecified: Secondary | ICD-10-CM | POA: Diagnosis not present

## 2018-11-10 DIAGNOSIS — M545 Low back pain: Secondary | ICD-10-CM | POA: Diagnosis not present

## 2018-11-10 DIAGNOSIS — M546 Pain in thoracic spine: Secondary | ICD-10-CM | POA: Diagnosis not present

## 2018-11-10 NOTE — ED Provider Notes (Signed)
[facility]    CSN: [id number] Arrival date & time: 11/10/18  1452     History   Chief Complaint Chief Complaint  Patient presents with  . Fall    HPI CIARRA BRADDY is a 73 y.o. female.   HPI SOSHA SHEPHERD is a 73 y.o. female presenting to UC with husband c/o diffuse pain after trip and fall PTA while at home. Pt fell onto a wood-looking floor. No LOC. Pt landed on her face, hitting her nose, c/o facial pain, neck, diffuse back, Right shoulder, and Left knee pain with a scrape on her Left knee. She had some bleeding from her nose, but it stopped easily PTA. Denies dizziness but has mild nausea.  Pt has a hx of chronic pain but states her pain is 10/10.  No pain medication taken PTA.  She takes a daily baby aspirin but no other blood thinners.    Past Medical History:  Diagnosis Date  . Diabetes mellitus without complication (HCC)   . Fibromyalgia   . High blood pressure   . High cholesterol   . Lung disease   . Myocardial infarction (HCC)   . Renal disorder     Patient Active Problem List   Diagnosis Date Noted  . Class 3 severe obesity due to excess calories in adult (HCC) 10/07/2018  . Hypokalemia 06/25/2018  . Inguinal hernia 06/25/2018  . Coronary artery disease with history of myocardial infarction without history of CABG 05/14/2018  . S/P coronary artery stent placement 05/14/2018  . Female pelvic pain 05/11/2018  . Type 2 diabetes mellitus with hyperglycemia (HCC) 04/12/2018  . Atrophic vaginitis 04/15/2017  . Angina pectoris (HCC) 02/10/2017  . Hyponatremia 02/10/2017  . Vitamin D deficiency 09/01/2016  . Fibromyalgia 08/01/2016  . Anxiety 01/04/2016  . Candidal skin infection 01/04/2016  . Chronic obstructive pulmonary disease, unspecified (HCC) 01/04/2016  . Hyperlipidemia, unspecified 01/04/2016  . Fatigue 06/12/2015  . Morbid obesity with alveolar hypoventilation (HCC) 11/03/2014  . Cardiomyopathy, ischemic 05/16/2014  . Obstructive  sleep apnea (adult) (pediatric) 02/18/2013  . Left ventricular diastolic dysfunction 10/06/2012  . Hypomagnesemia 09/09/2012  . Chronic kidney disease, stage 3 (moderate) (HCC) 08/20/2012  . Depression with anxiety 08/20/2012  . Essential hypertension 08/20/2012  . Gastro-esophageal reflux disease without esophagitis 08/20/2012  . Calculus of kidney 08/20/2012    Past Surgical History:  Procedure Laterality Date  . ABDOMINAL HYSTERECTOMY    . CHOLECYSTECTOMY    . CORONARY STENT PLACEMENT     x3  . FOOT SURGERY    . KIDNEY SURGERY    . TONSILLECTOMY      OB History   No obstetric history on file.      Home Medications    Prior to Admission medications   Medication Sig Start Date End Date Taking? Authorizing Provider  albuterol (PROVENTIL) (2.5 MG/3ML) 0.083% nebulizer solution Take 3 mLs by nebulization every 4 (four) hours as needed for shortness of breath. 11/02/18 12/03/18  Sunnie Nielsen, DO  Alpha-Lipoic Acid 300 MG CAPS Take 1 capsule by mouth daily.    [provider]  ARIPiprazole (ABILIFY) 10 MG tablet Take 10 mg by mouth daily.    [provider]  aspirin 81 MG tablet Take 1 tablet (81 mg total) by mouth daily. 11/02/18   Sunnie Nielsen, DO  atorvastatin (LIPITOR) 40 MG tablet Take 1 tablet (40 mg total) by mouth at bedtime. 11/02/18   Sunnie Nielsen, DO  carvedilol (COREG) 6.25 MG tablet Take  1 tablet (6.25 mg total) by mouth 2 (two) times daily. 11/02/18 11/03/19  Sunnie NielsenAlexander, Natalie, DO  clonazePAM (KLONOPIN) 2 MG tablet Take 1-2 tablets by mouth 2 (two) times daily. 1 tablet in the AM and 2 tablets in the evening 01/09/18   [provider]  DULoxetine (CYMBALTA) 60 MG capsule Take 2 capsules by mouth daily. 12/01/17   [provider]  fluticasone Aleda Grana(FLONASE ALLERGY RELIEF) 50 MCG/ACT nasal spray Place 2 sprays into the nose daily as needed for allergies.    [provider]  furosemide (LASIX) 40 MG tablet Take 2  tablets (80 mg total) by mouth daily. 11/02/18   Sunnie NielsenAlexander, Natalie, DO  Insulin Pen Needle (PEN NEEDLES) 31G X 8 MM MISC For use with insulin pen qid as directed, please dispense per patient preference and insurance coverage 10/27/18   Sunnie NielsenAlexander, Natalie, DO  Ipratropium-Albuterol (COMBIVENT RESPIMAT) 20-100 MCG/ACT AERS respimat Inhale 1 puff into the lungs every 6 (six) hours as needed for wheezing. 11/02/18   Sunnie NielsenAlexander, Natalie, DO  levOCARNitine L-Tartrate (L-CARNITINE) 500 MG CAPS Take 500 mg by mouth daily. 05/14/15   [provider]  Magnesium Oxide 400 (240 Mg) MG TABS Take 1-2 tablets by mouth 2 (two) times daily. 1 tablet in the AM and 2 tablets in the evening 11/09/17   [provider]  metFORMIN (GLUCOPHAGE) 850 MG tablet Take 0.5 tablets (425 mg total) by mouth 2 (two) times daily. 11/02/18   Sunnie NielsenAlexander, Natalie, DO  Multiple Vitamin (MULTIVITAMIN) capsule Take 1 capsule by mouth daily.    [provider]  nitroGLYCERIN (NITROSTAT) 0.4 MG SL tablet Place 1 tablet (0.4 mg total) under the tongue every 5 (five) minutes as needed for chest pain. 11/02/18 11/03/19  Sunnie NielsenAlexander, Natalie, DO  potassium chloride SA (K-DUR,KLOR-CON) 20 MEQ tablet Take 20 mEq by mouth daily.    [provider]  rOPINIRole (REQUIP) 2 MG tablet Take 3 tablets by mouth at bedtime. 08/15/17 08/16/18  [provider]  spironolactone (ALDACTONE) 25 MG tablet Take 1 tablet (25 mg total) by mouth daily. 11/02/18   Sunnie NielsenAlexander, Natalie, DO  Vitamin D, Ergocalciferol, (DRISDOL) 1.25 MG (50000 UT) CAPS capsule Take 1 capsule (50,000 Units total) by mouth every 14 (fourteen) days. 11/02/18   Sunnie NielsenAlexander, Natalie, DO    Family History History reviewed. No pertinent family history.  Social History Social History   Tobacco Use  . Smoking status: Never Smoker  . Smokeless tobacco: Never Used  Substance Use Topics  . Alcohol use: Never    Frequency: Never  . Drug use: Never     Allergies    Ciprofloxacin; Morphine; Penicillins; Latex; Promethazine; and Ibuprofen   Review of Systems Review of Systems  Cardiovascular: Negative for chest pain and palpitations.  Gastrointestinal: Positive for nausea. Negative for vomiting.  Musculoskeletal: Positive for arthralgias, back pain, myalgias and neck pain. Negative for gait problem, joint swelling and neck stiffness.  Skin: Positive for wound. Negative for color change.  Neurological: Positive for headaches ( mild). Negative for dizziness and light-headedness.     Physical Exam Triage Vital Signs ED Triage Vitals  Enc Vitals Group     BP 11/10/18 1548 (!) 184/87     Pulse Rate 11/10/18 1548 93     Resp 11/10/18 1548 18     Temp 11/10/18 1548 97.7 F (36.5 C)     Temp Source 11/10/18 1548 Tympanic     SpO2 11/10/18 1548 94 %     Weight 11/10/18 1549 210  lb (95.3 kg)     Height 11/10/18 1549 5\' 1"  (1.549 m)     Head Circumference --      Peak Flow --      Pain Score 11/10/18 1549 10     Pain Loc --      Pain Edu? --      Excl. in GC? --    No data found.  Updated Vital Signs BP (!) 184/87 (BP Location: Left Arm)   Pulse 93   Temp 97.7 F (36.5 C) (Tympanic)   Resp 18   Ht 5\' 1"  (1.549 m)   Wt 210 lb (95.3 kg)   SpO2 94%   BMI 39.68 kg/m   Visual Acuity Right Eye Distance:   Left Eye Distance:   Bilateral Distance:    Right Eye Near:   Left Eye Near:    Bilateral Near:     Physical Exam Vitals signs and nursing note reviewed.  Constitutional:      Appearance: Normal appearance. She is well-developed.  HENT:     Head: Normocephalic. Abrasion present. No raccoon eyes, Battle's sign or contusion.      Right Ear: Tympanic membrane normal.     Left Ear: Tympanic membrane normal.     Nose: Nasal tenderness present.     Right Sinus: No maxillary sinus tenderness or frontal sinus tenderness.     Left Sinus: No maxillary sinus tenderness or frontal sinus tenderness.      Comments: Superficial abrasion to  bridge of nose. Diffuse tenderness.    Mouth/Throat:     Lips: Pink.     Mouth: Mucous membranes are moist.     Pharynx: Oropharynx is clear. Uvula midline.  Neck:     Musculoskeletal: Normal range of motion and neck supple. Muscular tenderness present.     Comments: Diffuse tenderness neck muscular tenderness. No point spinal tenderness. Full ROM. Cardiovascular:     Rate and Rhythm: Normal rate and regular rhythm.  Pulmonary:     Effort: Pulmonary effort is normal.     Breath sounds: Normal breath sounds.  Chest:     Chest wall: No tenderness.  Musculoskeletal: Normal range of motion.        General: Tenderness present.     Comments: Back: diffuse tenderness along thoracic and lumbar spine and paraspinal muscles.  Right shoulder: mild tenderness to posterior aspect, Full ROM c/o crepitus. Left knee: minimal tenderness. No edema. Full ROM. calf is soft, non-tender.   5/5 grip strength  Skin:    General: Skin is warm and dry.     Comments: Left knee: superficial abrasion to knee.   Neurological:     Mental Status: She is alert and oriented to person, place, and time.  Psychiatric:        Behavior: Behavior normal.      UC Treatments / Results  Labs (all labs ordered are listed, but only abnormal results are displayed) Labs Reviewed - No data to display  EKG None  Radiology Dg Nasal Bones  Result Date: 11/10/2018 CLINICAL DATA:  Fall, facial pain. EXAM: NASAL BONES - 3+ VIEW COMPARISON:  None. FINDINGS: There is difficulty with positioning the patient. Particularly the right lateral views are oblique. However, no discrete fracture is identified. IMPRESSION: 1. No nasal fracture identified. Adversely affected negative predictive value due to the obliquity of the lateral view attempts-patient had difficulty cooperating with positioning. Electronically Signed   By: Gaylyn RongWalter  Liebkemann M.D.   On: 11/10/2018 16:39  Dg Cervical Spine Complete  Result Date: 11/10/2018 CLINICAL  DATA:  73 y/o  F; fall with back and neck pain. EXAM: THORACIC SPINE - 3 VIEWS; CERVICAL SPINE - COMPLETE 4+ VIEW; LUMBAR SPINE - COMPLETE 4+ VIEW COMPARISON:  02/01/2018 CT of head, cervical spine, thoracic spine, lumbar spine. FINDINGS: Cervical spine: C1-C5 visible on the lateral view. Normal cervical lordosis without listhesis. Odontoid process is intact on the open-mouth view. Normal anterior C1-2 articulation. Prevertebral soft tissue thickening is likely due to retropharyngeal carotid arteries as seen on prior CT of cervical spine. No significant loss of vertebral body or disc space height. Small anterior endplate marginal osteophytes at multiple levels. Thoracic spine: Twelve paired thoracic ribs. Normal thoracic kyphosis without listhesis. Stable mild T3, mild T4, and moderate T10 chronic compression deformities discogenic degenerative changes with loss of intervertebral disc space height and endplate marginal osteophytes of the lower thoracic spine. Normal cervicothoracic junction alignment on the swimmer's view. Lumbar spine: Five complete lumbar type non-rib-bearing vertebral bodies. Normal lumbar lordosis without listhesis. Stable chronic mild T12 and moderate L4 compression deformities no acute fracture identified. Mild loss of intervertebral disc space height at L3-4 and L5-S1. Prominent lower lumbar facet arthrosis. Surgical clips project over the right hemipelvis and right upper quadrant. IMPRESSION: 1. No acute fracture or dislocation identified in the cervical, thoracic, or lumbar spine. 2. Stable chronic T3, T4, T10, T12, and L4 compression deformities. Electronically Signed   By: Mitzi Hansen M.D.   On: 11/10/2018 16:45   Dg Thoracic Spine W/swimmers  Result Date: 11/10/2018 CLINICAL DATA:  73 y/o  F; fall with back and neck pain. EXAM: THORACIC SPINE - 3 VIEWS; CERVICAL SPINE - COMPLETE 4+ VIEW; LUMBAR SPINE - COMPLETE 4+ VIEW COMPARISON:  02/01/2018 CT of head, cervical spine,  thoracic spine, lumbar spine. FINDINGS: Cervical spine: C1-C5 visible on the lateral view. Normal cervical lordosis without listhesis. Odontoid process is intact on the open-mouth view. Normal anterior C1-2 articulation. Prevertebral soft tissue thickening is likely due to retropharyngeal carotid arteries as seen on prior CT of cervical spine. No significant loss of vertebral body or disc space height. Small anterior endplate marginal osteophytes at multiple levels. Thoracic spine: Twelve paired thoracic ribs. Normal thoracic kyphosis without listhesis. Stable mild T3, mild T4, and moderate T10 chronic compression deformities discogenic degenerative changes with loss of intervertebral disc space height and endplate marginal osteophytes of the lower thoracic spine. Normal cervicothoracic junction alignment on the swimmer's view. Lumbar spine: Five complete lumbar type non-rib-bearing vertebral bodies. Normal lumbar lordosis without listhesis. Stable chronic mild T12 and moderate L4 compression deformities no acute fracture identified. Mild loss of intervertebral disc space height at L3-4 and L5-S1. Prominent lower lumbar facet arthrosis. Surgical clips project over the right hemipelvis and right upper quadrant. IMPRESSION: 1. No acute fracture or dislocation identified in the cervical, thoracic, or lumbar spine. 2. Stable chronic T3, T4, T10, T12, and L4 compression deformities. Electronically Signed   By: Mitzi Hansen M.D.   On: 11/10/2018 16:45   Dg Lumbar Spine Complete  Result Date: 11/10/2018 CLINICAL DATA:  73 y/o  F; fall with back and neck pain. EXAM: THORACIC SPINE - 3 VIEWS; CERVICAL SPINE - COMPLETE 4+ VIEW; LUMBAR SPINE - COMPLETE 4+ VIEW COMPARISON:  02/01/2018 CT of head, cervical spine, thoracic spine, lumbar spine. FINDINGS: Cervical spine: C1-C5 visible on the lateral view. Normal cervical lordosis without listhesis. Odontoid process is intact on the open-mouth view. Normal anterior  C1-2 articulation. Prevertebral soft  tissue thickening is likely due to retropharyngeal carotid arteries as seen on prior CT of cervical spine. No significant loss of vertebral body or disc space height. Small anterior endplate marginal osteophytes at multiple levels. Thoracic spine: Twelve paired thoracic ribs. Normal thoracic kyphosis without listhesis. Stable mild T3, mild T4, and moderate T10 chronic compression deformities discogenic degenerative changes with loss of intervertebral disc space height and endplate marginal osteophytes of the lower thoracic spine. Normal cervicothoracic junction alignment on the swimmer's view. Lumbar spine: Five complete lumbar type non-rib-bearing vertebral bodies. Normal lumbar lordosis without listhesis. Stable chronic mild T12 and moderate L4 compression deformities no acute fracture identified. Mild loss of intervertebral disc space height at L3-4 and L5-S1. Prominent lower lumbar facet arthrosis. Surgical clips project over the right hemipelvis and right upper quadrant. IMPRESSION: 1. No acute fracture or dislocation identified in the cervical, thoracic, or lumbar spine. 2. Stable chronic T3, T4, T10, T12, and L4 compression deformities. Electronically Signed   By: Mitzi Hansen M.D.   On: 11/10/2018 16:45   Ct Head Wo Contrast  Result Date: 11/10/2018 CLINICAL DATA:  Fall with craniofacial trauma.  Headache. EXAM: CT HEAD WITHOUT CONTRAST TECHNIQUE: Contiguous axial images were obtained from the base of the skull through the vertex without intravenous contrast. COMPARISON:  02/01/2018 FINDINGS: Brain: The brainstem, cerebellum, cerebral peduncles, thalami, basal ganglia, basilar cisterns, and ventricular system appear within normal limits. No intracranial hemorrhage, mass lesion, or acute CVA. Vascular: There is atherosclerotic calcification of the cavernous carotid arteries bilaterally. Skull: Unremarkable Sinuses/Orbits: Unremarkable Other: No supplemental  non-categorized findings. IMPRESSION: 1. No acute intracranial findings. 2. Atherosclerosis. Electronically Signed   By: Gaylyn Rong M.D.   On: 11/10/2018 16:42    Procedures Procedures (including critical care time)  Medications Ordered in UC Medications - No data to display  Initial Impression / Assessment and Plan / UC Course  I have reviewed the triage vital signs and the nursing notes.  Pertinent labs & imaging results that were available during my care of the patient were reviewed by me and considered in my medical decision making (see chart for details).     Pt c/o diffuse pain after trip and fall at home. Hx of chronic pain.  Difficult for pt to localize pain. Reviewed imaging  Reassured pt and husband no acute findings.  Encouraged f/u with PCP   Final Clinical Impressions(s) / UC Diagnoses   Final diagnoses:  Neck pain  Back pain  Fall from slip, trip, or stumble, initial encounter     Discharge Instructions      You may take Tylenol and Motrin as needed for pain. It is recommended that you alternate cool and warm compresses to help with muscle soreness.  Please call to schedule a follow up visit with your family doctor later this week or early next week if not improving.   Call 911 or go to the hospital if symptoms significantly worsening.     ED Prescriptions    None     Controlled Substance Prescriptions [place] Controlled Substance Registry consulted? Not Applicable   Rolla Plate 11/10/18 1831

## 2018-11-10 NOTE — Discharge Instructions (Signed)
°  You may take Tylenol and Motrin as needed for pain. It is recommended that you alternate cool and warm compresses to help with muscle soreness.  Please call to schedule a follow up visit with your family doctor later this week or early next week if not improving.   Call 911 or go to the hospital if symptoms significantly worsening.

## 2018-11-10 NOTE — ED Triage Notes (Signed)
Pt c/o facial, RT back, RT shoulder and LT knee pain x 1430 today post fall. She has not taken any OTC meds. Offered Tylenol and pt refused.

## 2018-11-11 ENCOUNTER — Encounter: Payer: Self-pay | Admitting: Cardiology

## 2018-11-15 ENCOUNTER — Other Ambulatory Visit: Payer: Self-pay

## 2018-11-15 ENCOUNTER — Encounter (HOSPITAL_BASED_OUTPATIENT_CLINIC_OR_DEPARTMENT_OTHER): Payer: Self-pay | Admitting: *Deleted

## 2018-11-15 ENCOUNTER — Emergency Department (HOSPITAL_BASED_OUTPATIENT_CLINIC_OR_DEPARTMENT_OTHER): Payer: Medicare Other

## 2018-11-15 ENCOUNTER — Inpatient Hospital Stay (HOSPITAL_BASED_OUTPATIENT_CLINIC_OR_DEPARTMENT_OTHER)
Admission: EM | Admit: 2018-11-15 | Discharge: 2018-11-18 | DRG: 193 | Disposition: A | Discharge status: Home Health | Payer: Medicare Other | Attending: Internal Medicine | Admitting: Internal Medicine

## 2018-11-15 DIAGNOSIS — E1122 Type 2 diabetes mellitus with diabetic chronic kidney disease: Secondary | ICD-10-CM | POA: Diagnosis present

## 2018-11-15 DIAGNOSIS — Z79899 Other long term (current) drug therapy: Secondary | ICD-10-CM

## 2018-11-15 DIAGNOSIS — I5042 Chronic combined systolic (congestive) and diastolic (congestive) heart failure: Secondary | ICD-10-CM | POA: Diagnosis present

## 2018-11-15 DIAGNOSIS — J441 Chronic obstructive pulmonary disease with (acute) exacerbation: Secondary | ICD-10-CM | POA: Diagnosis present

## 2018-11-15 DIAGNOSIS — F329 Major depressive disorder, single episode, unspecified: Secondary | ICD-10-CM | POA: Diagnosis present

## 2018-11-15 DIAGNOSIS — R0689 Other abnormalities of breathing: Secondary | ICD-10-CM | POA: Diagnosis present

## 2018-11-15 DIAGNOSIS — K219 Gastro-esophageal reflux disease without esophagitis: Secondary | ICD-10-CM | POA: Diagnosis present

## 2018-11-15 DIAGNOSIS — I252 Old myocardial infarction: Secondary | ICD-10-CM

## 2018-11-15 DIAGNOSIS — I130 Hypertensive heart and chronic kidney disease with heart failure and stage 1 through stage 4 chronic kidney disease, or unspecified chronic kidney disease: Secondary | ICD-10-CM | POA: Diagnosis present

## 2018-11-15 DIAGNOSIS — Z885 Allergy status to narcotic agent status: Secondary | ICD-10-CM

## 2018-11-15 DIAGNOSIS — Z8249 Family history of ischemic heart disease and other diseases of the circulatory system: Secondary | ICD-10-CM

## 2018-11-15 DIAGNOSIS — J181 Lobar pneumonia, unspecified organism: Secondary | ICD-10-CM | POA: Diagnosis present

## 2018-11-15 DIAGNOSIS — M797 Fibromyalgia: Secondary | ICD-10-CM | POA: Diagnosis present

## 2018-11-15 DIAGNOSIS — J440 Chronic obstructive pulmonary disease with acute lower respiratory infection: Secondary | ICD-10-CM | POA: Diagnosis present

## 2018-11-15 DIAGNOSIS — Z888 Allergy status to other drugs, medicaments and biological substances status: Secondary | ICD-10-CM

## 2018-11-15 DIAGNOSIS — Z794 Long term (current) use of insulin: Secondary | ICD-10-CM

## 2018-11-15 DIAGNOSIS — Z7982 Long term (current) use of aspirin: Secondary | ICD-10-CM

## 2018-11-15 DIAGNOSIS — E662 Morbid (severe) obesity with alveolar hypoventilation: Secondary | ICD-10-CM | POA: Diagnosis present

## 2018-11-15 DIAGNOSIS — Z9981 Dependence on supplemental oxygen: Secondary | ICD-10-CM | POA: Diagnosis not present

## 2018-11-15 DIAGNOSIS — J9621 Acute and chronic respiratory failure with hypoxia: Secondary | ICD-10-CM | POA: Diagnosis present

## 2018-11-15 DIAGNOSIS — Z66 Do not resuscitate: Secondary | ICD-10-CM | POA: Diagnosis present

## 2018-11-15 DIAGNOSIS — N183 Chronic kidney disease, stage 3 unspecified: Secondary | ICD-10-CM | POA: Diagnosis present

## 2018-11-15 DIAGNOSIS — N289 Disorder of kidney and ureter, unspecified: Secondary | ICD-10-CM | POA: Diagnosis present

## 2018-11-15 DIAGNOSIS — I2510 Atherosclerotic heart disease of native coronary artery without angina pectoris: Secondary | ICD-10-CM | POA: Diagnosis present

## 2018-11-15 DIAGNOSIS — E1165 Type 2 diabetes mellitus with hyperglycemia: Secondary | ICD-10-CM | POA: Diagnosis present

## 2018-11-15 DIAGNOSIS — Z6841 Body Mass Index (BMI) 40.0 and over, adult: Secondary | ICD-10-CM | POA: Diagnosis not present

## 2018-11-15 DIAGNOSIS — E785 Hyperlipidemia, unspecified: Secondary | ICD-10-CM | POA: Diagnosis present

## 2018-11-15 DIAGNOSIS — I2699 Other pulmonary embolism without acute cor pulmonale: Secondary | ICD-10-CM | POA: Diagnosis present

## 2018-11-15 DIAGNOSIS — Z87442 Personal history of urinary calculi: Secondary | ICD-10-CM

## 2018-11-15 DIAGNOSIS — E7800 Pure hypercholesterolemia, unspecified: Secondary | ICD-10-CM | POA: Diagnosis present

## 2018-11-15 DIAGNOSIS — I10 Essential (primary) hypertension: Secondary | ICD-10-CM | POA: Diagnosis not present

## 2018-11-15 DIAGNOSIS — Z91040 Latex allergy status: Secondary | ICD-10-CM

## 2018-11-15 DIAGNOSIS — Z880 Allergy status to penicillin: Secondary | ICD-10-CM

## 2018-11-15 DIAGNOSIS — I255 Ischemic cardiomyopathy: Secondary | ICD-10-CM | POA: Diagnosis not present

## 2018-11-15 DIAGNOSIS — G4733 Obstructive sleep apnea (adult) (pediatric): Secondary | ICD-10-CM | POA: Diagnosis not present

## 2018-11-15 DIAGNOSIS — J189 Pneumonia, unspecified organism: Secondary | ICD-10-CM | POA: Diagnosis present

## 2018-11-15 DIAGNOSIS — Z955 Presence of coronary angioplasty implant and graft: Secondary | ICD-10-CM

## 2018-11-15 LAB — CBC WITH DIFFERENTIAL/PLATELET
Abs Immature Granulocytes: 0.02 10*3/uL (ref 0.00–0.07)
Basophils Absolute: 0.1 10*3/uL (ref 0.0–0.1)
Basophils Relative: 1 %
Eosinophils Absolute: 0.3 10*3/uL (ref 0.0–0.5)
Eosinophils Relative: 3 %
HCT: 44.7 % (ref 36.0–46.0)
Hemoglobin: 14.5 g/dL (ref 12.0–15.0)
IMMATURE GRANULOCYTES: 0 %
LYMPHS ABS: 2.4 10*3/uL (ref 0.7–4.0)
Lymphocytes Relative: 27 %
MCH: 31.5 pg (ref 26.0–34.0)
MCHC: 32.4 g/dL (ref 30.0–36.0)
MCV: 97 fL (ref 80.0–100.0)
Monocytes Absolute: 0.7 10*3/uL (ref 0.1–1.0)
Monocytes Relative: 7 %
Neutro Abs: 5.4 10*3/uL (ref 1.7–7.7)
Neutrophils Relative %: 62 %
Platelets: 207 10*3/uL (ref 150–400)
RBC: 4.61 MIL/uL (ref 3.87–5.11)
RDW: 13.5 % (ref 11.5–15.5)
WBC: 8.8 10*3/uL (ref 4.0–10.5)
nRBC: 0 % (ref 0.0–0.2)

## 2018-11-15 LAB — URINALYSIS, ROUTINE W REFLEX MICROSCOPIC
Bilirubin Urine: NEGATIVE
Glucose, UA: NEGATIVE mg/dL
KETONES UR: NEGATIVE mg/dL
LEUKOCYTES UA: NEGATIVE
Nitrite: NEGATIVE
PROTEIN: NEGATIVE mg/dL
Specific Gravity, Urine: 1.01 (ref 1.005–1.030)
pH: 7 (ref 5.0–8.0)

## 2018-11-15 LAB — INFLUENZA PANEL BY PCR (TYPE A & B)
INFLBPCR: NEGATIVE
Influenza A By PCR: NEGATIVE

## 2018-11-15 LAB — BLOOD GAS, VENOUS
ACID-BASE EXCESS: 2.9 mmol/L — AB (ref 0.0–2.0)
Bicarbonate: 30.4 mmol/L — ABNORMAL HIGH (ref 20.0–28.0)
O2 Content: 4 L/min
O2 Saturation: 92.2 %
Patient temperature: 98.3
pCO2, Ven: 60.2 mmHg — ABNORMAL HIGH (ref 44.0–60.0)
pH, Ven: 7.323 (ref 7.250–7.430)
pO2, Ven: 68.8 mmHg — ABNORMAL HIGH (ref 32.0–45.0)

## 2018-11-15 LAB — EXPECTORATED SPUTUM ASSESSMENT W GRAM STAIN, RFLX TO RESP C: Special Requests: NORMAL

## 2018-11-15 LAB — BASIC METABOLIC PANEL
Anion gap: 6 (ref 5–15)
BUN: 15 mg/dL (ref 8–23)
CHLORIDE: 103 mmol/L (ref 98–111)
CO2: 31 mmol/L (ref 22–32)
CREATININE: 1.04 mg/dL — AB (ref 0.44–1.00)
Calcium: 9.9 mg/dL (ref 8.9–10.3)
GFR calc Af Amer: >60 mL/min (ref >60)
GFR calc non Af Amer: 53 mL/min — ABNORMAL LOW (ref >60)
Glucose, Bld: 158 mg/dL — ABNORMAL HIGH (ref 70–99)
Potassium: 4.1 mmol/L (ref 3.5–5.1)
Sodium: 140 mmol/L (ref 135–145)

## 2018-11-15 LAB — URINALYSIS, MICROSCOPIC (REFLEX): WBC, UA: NONE SEEN WBC/hpf (ref 0–5)

## 2018-11-15 LAB — TROPONIN I: Troponin I: <0.03 ng/mL (ref <0.03)

## 2018-11-15 LAB — GLUCOSE, CAPILLARY
Glucose-Capillary: 84 mg/dL (ref 70–99)
Glucose-Capillary: 144 mg/dL — ABNORMAL HIGH (ref 70–99)

## 2018-11-15 LAB — D-DIMER, QUANTITATIVE (NOT AT ARMC): D DIMER QUANT: 0.8 ug{FEU}/mL — AB (ref 0.00–0.50)

## 2018-11-15 MED ORDER — ALBUTEROL SULFATE (2.5 MG/3ML) 0.083% IN NEBU
2.5000 mg | INHALATION_SOLUTION | RESPIRATORY_TRACT | Status: DC | PRN
  Administered 2018-11-16: 2.5 mg via RESPIRATORY_TRACT
  Filled 2018-11-15: qty 3

## 2018-11-15 MED ORDER — INSULIN ASPART 100 UNIT/ML ~~LOC~~ SOLN
0.0000 [IU] | SUBCUTANEOUS | Status: DC
  Administered 2018-11-16: 5 [IU] via SUBCUTANEOUS
  Administered 2018-11-16: 3 [IU] via SUBCUTANEOUS
  Administered 2018-11-16: 5 [IU] via SUBCUTANEOUS
  Administered 2018-11-16: 7 [IU] via SUBCUTANEOUS
  Administered 2018-11-17: 5 [IU] via SUBCUTANEOUS
  Administered 2018-11-17: 3 [IU] via SUBCUTANEOUS
  Administered 2018-11-17: 7 [IU] via SUBCUTANEOUS
  Administered 2018-11-17: 5 [IU] via SUBCUTANEOUS

## 2018-11-15 MED ORDER — ARIPIPRAZOLE 10 MG PO TABS
10.0000 mg | ORAL_TABLET | Freq: Every day | ORAL | Status: DC
  Administered 2018-11-16 – 2018-11-18 (×3): 10 mg via ORAL
  Filled 2018-11-15 (×3): qty 1

## 2018-11-15 MED ORDER — IPRATROPIUM-ALBUTEROL 0.5-2.5 (3) MG/3ML IN SOLN
3.0000 mL | Freq: Four times a day (QID) | RESPIRATORY_TRACT | Status: DC
  Administered 2018-11-15 – 2018-11-16 (×3): 3 mL via RESPIRATORY_TRACT
  Filled 2018-11-15 (×4): qty 3

## 2018-11-15 MED ORDER — FUROSEMIDE 40 MG PO TABS
40.0000 mg | ORAL_TABLET | Freq: Two times a day (BID) | ORAL | Status: DC
  Administered 2018-11-16 – 2018-11-18 (×5): 40 mg via ORAL
  Filled 2018-11-15 (×5): qty 1

## 2018-11-15 MED ORDER — SODIUM CHLORIDE 0.9 % IV SOLN
INTRAVENOUS | Status: AC
  Administered 2018-11-15 – 2018-11-16 (×2): via INTRAVENOUS

## 2018-11-15 MED ORDER — SODIUM CHLORIDE 0.9 % IV SOLN
500.0000 mg | INTRAVENOUS | Status: DC
  Administered 2018-11-16 – 2018-11-17 (×2): 500 mg via INTRAVENOUS
  Filled 2018-11-15 (×3): qty 500

## 2018-11-15 MED ORDER — SODIUM CHLORIDE 0.9 % IV SOLN
500.0000 mg | Freq: Once | INTRAVENOUS | Status: AC
  Administered 2018-11-15: 500 mg via INTRAVENOUS
  Filled 2018-11-15: qty 500

## 2018-11-15 MED ORDER — ONDANSETRON HCL 4 MG PO TABS: 4.0000 mg | ORAL_TABLET | Freq: Four times a day (QID) | ORAL | Status: DC | PRN

## 2018-11-15 MED ORDER — DULOXETINE HCL 60 MG PO CPEP
120.0000 mg | ORAL_CAPSULE | Freq: Every day | ORAL | Status: DC
  Administered 2018-11-16 – 2018-11-18 (×3): 120 mg via ORAL
  Filled 2018-11-15: qty 2
  Filled 2018-11-15 (×2): qty 4

## 2018-11-15 MED ORDER — ENOXAPARIN SODIUM 40 MG/0.4ML ~~LOC~~ SOLN
40.0000 mg | SUBCUTANEOUS | Status: DC
  Administered 2018-11-15: 40 mg via SUBCUTANEOUS
  Filled 2018-11-15: qty 0.4

## 2018-11-15 MED ORDER — ROPINIROLE HCL 1 MG PO TABS
6.0000 mg | ORAL_TABLET | Freq: Every day | ORAL | Status: DC
  Administered 2018-11-15 – 2018-11-16 (×2): 6 mg via ORAL
  Filled 2018-11-15 (×3): qty 6

## 2018-11-15 MED ORDER — SODIUM CHLORIDE 0.9 % IV SOLN
1.0000 g | INTRAVENOUS | Status: DC
  Filled 2018-11-15: qty 10

## 2018-11-15 MED ORDER — METHYLPREDNISOLONE SODIUM SUCC 125 MG IJ SOLR
60.0000 mg | Freq: Two times a day (BID) | INTRAMUSCULAR | Status: AC
  Administered 2018-11-15 – 2018-11-16 (×2): 60 mg via INTRAVENOUS
  Filled 2018-11-15 (×2): qty 2

## 2018-11-15 MED ORDER — SODIUM CHLORIDE 0.9% FLUSH
3.0000 mL | Freq: Two times a day (BID) | INTRAVENOUS | Status: DC
  Administered 2018-11-16 – 2018-11-17 (×4): 3 mL via INTRAVENOUS

## 2018-11-15 MED ORDER — ACETAMINOPHEN 325 MG PO TABS
650.0000 mg | ORAL_TABLET | Freq: Four times a day (QID) | ORAL | Status: DC | PRN
  Administered 2018-11-15 – 2018-11-17 (×3): 650 mg via ORAL
  Filled 2018-11-15 (×3): qty 2

## 2018-11-15 MED ORDER — SODIUM CHLORIDE 0.9% FLUSH: 3.0000 mL | INTRAVENOUS | Status: DC | PRN

## 2018-11-15 MED ORDER — SPIRONOLACTONE 25 MG PO TABS
25.0000 mg | ORAL_TABLET | Freq: Every day | ORAL | Status: DC
  Administered 2018-11-16 – 2018-11-18 (×3): 25 mg via ORAL
  Filled 2018-11-15 (×3): qty 1

## 2018-11-15 MED ORDER — LIP MEDEX EX OINT
TOPICAL_OINTMENT | CUTANEOUS | Status: AC
  Administered 2018-11-15: 23:00:00
  Filled 2018-11-15: qty 7

## 2018-11-15 MED ORDER — INSULIN GLARGINE 100 UNIT/ML ~~LOC~~ SOLN
30.0000 [IU] | Freq: Every day | SUBCUTANEOUS | Status: DC
  Administered 2018-11-15 – 2018-11-16 (×2): 30 [IU] via SUBCUTANEOUS
  Filled 2018-11-15 (×3): qty 0.3

## 2018-11-15 MED ORDER — METOCLOPRAMIDE HCL 5 MG/ML IJ SOLN
10.0000 mg | Freq: Once | INTRAMUSCULAR | Status: AC
  Administered 2018-11-15: 10 mg via INTRAVENOUS
  Filled 2018-11-15: qty 2

## 2018-11-15 MED ORDER — IPRATROPIUM-ALBUTEROL 0.5-2.5 (3) MG/3ML IN SOLN
3.0000 mL | Freq: Once | RESPIRATORY_TRACT | Status: AC
  Administered 2018-11-15: 3 mL via RESPIRATORY_TRACT

## 2018-11-15 MED ORDER — SODIUM CHLORIDE 0.9 % IV SOLN
500.0000 mg | Freq: Every day | INTRAVENOUS | Status: DC
  Filled 2018-11-15: qty 500

## 2018-11-15 MED ORDER — SODIUM CHLORIDE 0.9 % IV SOLN: 250.0000 mL | INTRAVENOUS | Status: DC | PRN

## 2018-11-15 MED ORDER — SODIUM CHLORIDE 0.9 % IV SOLN
1.0000 g | Freq: Once | INTRAVENOUS | Status: AC
  Administered 2018-11-15: 1 g via INTRAVENOUS
  Filled 2018-11-15: qty 10

## 2018-11-15 MED ORDER — PREDNISONE 20 MG PO TABS
40.0000 mg | ORAL_TABLET | Freq: Every day | ORAL | Status: DC
  Administered 2018-11-17 – 2018-11-18 (×2): 40 mg via ORAL
  Filled 2018-11-15 (×2): qty 2

## 2018-11-15 MED ORDER — AZITHROMYCIN 500 MG IV SOLR
INTRAVENOUS | Status: AC
  Filled 2018-11-15: qty 500

## 2018-11-15 MED ORDER — ACETAMINOPHEN 650 MG RE SUPP: 650.0000 mg | Freq: Four times a day (QID) | RECTAL | Status: DC | PRN

## 2018-11-15 MED ORDER — ATORVASTATIN CALCIUM 40 MG PO TABS
40.0000 mg | ORAL_TABLET | Freq: Every day | ORAL | Status: DC
  Administered 2018-11-15 – 2018-11-17 (×3): 40 mg via ORAL
  Filled 2018-11-15 (×3): qty 1

## 2018-11-15 MED ORDER — CLONAZEPAM 1 MG PO TABS
2.0000 mg | ORAL_TABLET | Freq: Three times a day (TID) | ORAL | Status: DC | PRN
  Administered 2018-11-15 – 2018-11-18 (×6): 2 mg via ORAL
  Filled 2018-11-15 (×6): qty 2

## 2018-11-15 MED ORDER — ONDANSETRON HCL 4 MG/2ML IJ SOLN
4.0000 mg | Freq: Four times a day (QID) | INTRAMUSCULAR | Status: DC | PRN
  Administered 2018-11-16: 4 mg via INTRAVENOUS
  Filled 2018-11-15: qty 2

## 2018-11-15 MED ORDER — ASPIRIN EC 81 MG PO TBEC
81.0000 mg | DELAYED_RELEASE_TABLET | Freq: Every day | ORAL | Status: DC
  Administered 2018-11-15 – 2018-11-18 (×4): 81 mg via ORAL
  Filled 2018-11-15 (×4): qty 1

## 2018-11-15 NOTE — ED Provider Notes (Signed)
[facility] EMERGENCY DEPARTMENT Provider Note   CSN: [id number] Arrival date & time: 11/15/18  1233     History   Chief Complaint Chief Complaint  Patient presents with  . Abdominal Pain    HPI ADRINA PUMPHREY is a 73 y.o. female.  Patient is a 73 year old female who presents with worsening shortness of breath and fatigue.  She has some chronic symptoms such as fibromyalgia, chronic nausea, chronic abdominal pressure and headaches.  She reports chronic shortness of breath and uses oxygen at home at 2 L/min.  She has underlying COPD.  She says over the last week she has been increasingly fatigued and has a cough which is productive of some white-yellow sputum.  She denies any fevers.  She has some tenderness to her left chest which is worse with movement.  She did have a fall recently and does not know if she injured it.  She denies any vomiting.  No urinary symptoms.  No change in her chronic abdominal discomfort.  She has been having to use her oxygen all day long over the last 2 to 3 days.     Past Medical History:  Diagnosis Date  . Diabetes mellitus without complication (HCC)   . Fibromyalgia   . High blood pressure   . High cholesterol   . Lung disease   . Myocardial infarction (HCC)   . Renal disorder     Patient Active Problem List   Diagnosis Date Noted  . Acute on chronic respiratory failure with hypoxia (HCC) 11/15/2018  . Class 3 severe obesity due to excess calories in adult (HCC) 10/07/2018  . Hypokalemia 06/25/2018  . Inguinal hernia 06/25/2018  . Coronary artery disease with history of myocardial infarction without history of CABG 05/14/2018  . S/P coronary artery stent placement 05/14/2018  . Female pelvic pain 05/11/2018  . Type 2 diabetes mellitus with hyperglycemia (HCC) 04/12/2018  . Atrophic vaginitis 04/15/2017  . Angina pectoris (HCC) 02/10/2017  . Hyponatremia 02/10/2017  . Vitamin D deficiency 09/01/2016  . Fibromyalgia 08/01/2016    . Anxiety 01/04/2016  . Candidal skin infection 01/04/2016  . Chronic obstructive pulmonary disease, unspecified (HCC) 01/04/2016  . Hyperlipidemia, unspecified 01/04/2016  . Fatigue 06/12/2015  . Morbid obesity with alveolar hypoventilation (HCC) 11/03/2014  . Cardiomyopathy, ischemic 05/16/2014  . Obstructive sleep apnea (adult) (pediatric) 02/18/2013  . Left ventricular diastolic dysfunction 10/06/2012  . Hypomagnesemia 09/09/2012  . Chronic kidney disease, stage 3 (moderate) (HCC) 08/20/2012  . Depression with anxiety 08/20/2012  . Essential hypertension 08/20/2012  . Gastro-esophageal reflux disease without esophagitis 08/20/2012  . Calculus of kidney 08/20/2012    Past Surgical History:  Procedure Laterality Date  . ABDOMINAL HYSTERECTOMY    . CHOLECYSTECTOMY    . CORONARY STENT PLACEMENT     x3  . FOOT SURGERY    . KIDNEY SURGERY    . TONSILLECTOMY       OB History   No obstetric history on file.      Home Medications    Prior to Admission medications   Medication Sig Start Date End Date Taking? Authorizing Provider  albuterol (PROVENTIL) (2.5 MG/3ML) 0.083% nebulizer solution Take 3 mLs by nebulization every 4 (four) hours as needed for shortness of breath. 11/02/18 12/03/18  Sunnie Nielsen, DO  Alpha-Lipoic Acid 300 MG CAPS Take 1 capsule by mouth daily.    [provider]  ARIPiprazole (ABILIFY) 10 MG tablet Take 10 mg by mouth daily.    [provider]  aspirin 81 MG tablet Take 1 tablet (81 mg total) by mouth daily. 11/02/18   Sunnie Nielsen, DO  atorvastatin (LIPITOR) 40 MG tablet Take 1 tablet (40 mg total) by mouth at bedtime. 11/02/18   Sunnie Nielsen, DO  carvedilol (COREG) 6.25 MG tablet Take 1 tablet (6.25 mg total) by mouth 2 (two) times daily. 11/02/18 11/03/19  Sunnie Nielsen, DO  clonazePAM (KLONOPIN) 2 MG tablet Take 1-2 tablets by mouth 2 (two) times daily. 1 tablet in the AM and 2 tablets in the evening 01/09/18    [provider]  DULoxetine (CYMBALTA) 60 MG capsule Take 2 capsules by mouth daily. 12/01/17   [provider]  fluticasone Aleda Grana ALLERGY RELIEF) 50 MCG/ACT nasal spray Place 2 sprays into the nose daily as needed for allergies.    [provider]  furosemide (LASIX) 40 MG tablet Take 2 tablets (80 mg total) by mouth daily. 11/02/18   Sunnie Nielsen, DO  Insulin Pen Needle (PEN NEEDLES) 31G X 8 MM MISC For use with insulin pen qid as directed, please dispense per patient preference and insurance coverage 10/27/18   Sunnie Nielsen, DO  Ipratropium-Albuterol (COMBIVENT RESPIMAT) 20-100 MCG/ACT AERS respimat Inhale 1 puff into the lungs every 6 (six) hours as needed for wheezing. 11/02/18   Sunnie Nielsen, DO  levOCARNitine L-Tartrate (L-CARNITINE) 500 MG CAPS Take 500 mg by mouth daily. 05/14/15   [provider]  Magnesium Oxide 400 (240 Mg) MG TABS Take 1-2 tablets by mouth 2 (two) times daily. 1 tablet in the AM and 2 tablets in the evening 11/09/17   [provider]  metFORMIN (GLUCOPHAGE) 850 MG tablet Take 0.5 tablets (425 mg total) by mouth 2 (two) times daily. 11/02/18   Sunnie Nielsen, DO  Multiple Vitamin (MULTIVITAMIN) capsule Take 1 capsule by mouth daily.    [provider]  nitroGLYCERIN (NITROSTAT) 0.4 MG SL tablet Place 1 tablet (0.4 mg total) under the tongue every 5 (five) minutes as needed for chest pain. 11/02/18 11/03/19  Sunnie Nielsen, DO  potassium chloride SA (K-DUR,KLOR-CON) 20 MEQ tablet Take 20 mEq by mouth daily.    [provider]  rOPINIRole (REQUIP) 2 MG tablet Take 3 tablets by mouth at bedtime. 08/15/17 08/16/18  [provider]  spironolactone (ALDACTONE) 25 MG tablet Take 1 tablet (25 mg total) by mouth daily. 11/02/18   Sunnie Nielsen, DO  Vitamin D, Ergocalciferol, (DRISDOL) 1.25 MG (50000 UT) CAPS capsule Take 1 capsule (50,000 Units total) by mouth every 14 (fourteen) days.  11/02/18   Sunnie Nielsen, DO    Family History No family history on file.  Social History Social History   Tobacco Use  . Smoking status: Never Smoker  . Smokeless tobacco: Never Used  Substance Use Topics  . Alcohol use: Never    Frequency: Never  . Drug use: Never     Allergies   Morphine; Ciprofloxacin; Latex; Promethazine; Ibuprofen; and Penicillins   Review of Systems Review of Systems  Constitutional: Positive for fatigue. Negative for chills, diaphoresis and fever.  HENT: Negative for congestion, rhinorrhea and sneezing.   Eyes: Negative.   Respiratory: Positive for cough and shortness of breath. Negative for chest tightness.   Cardiovascular: Negative for chest pain and leg swelling.  Gastrointestinal: Positive for nausea. Negative for abdominal pain, blood in stool, diarrhea and vomiting.  Genitourinary: Negative for difficulty urinating, flank pain, frequency and hematuria.  Musculoskeletal: Negative for arthralgias and back pain.  Skin: Negative for rash.  Neurological: Positive for headaches. Negative for dizziness, speech difficulty, weakness and numbness.     Physical Exam Updated Vital Signs BP (!) 157/64   Pulse 83   Temp (!) 97.5 F (36.4 C) (Oral)   Resp (!) 22   Ht 5\' 1"  (1.549 m)   Wt 95.2 kg   SpO2 97%   BMI 39.66 kg/m   Physical Exam Constitutional:      Appearance: She is well-developed. She is ill-appearing.  HENT:     Head: Normocephalic and atraumatic.  Eyes:     Pupils: Pupils are equal, round, and reactive to light.  Neck:     Musculoskeletal: Normal range of motion and neck supple.  Cardiovascular:     Rate and Rhythm: Normal rate and regular rhythm.     Heart sounds: Normal heart sounds.  Pulmonary:     Effort: Pulmonary effort is normal. Tachypnea present. No respiratory distress.     Breath sounds: Normal breath sounds. No wheezing or rales.  Chest:     Chest wall: Tenderness (Tenderness on palpation of the left  chest wall without crepitus or deformity) present.  Abdominal:     General: Bowel sounds are normal.     Palpations: Abdomen is soft.     Tenderness: There is no abdominal tenderness. There is no guarding or rebound.  Musculoskeletal: Normal range of motion.     Comments: No calf tenderness  Lymphadenopathy:     Cervical: No cervical adenopathy.  Skin:    General: Skin is warm and dry.     Findings: No rash.  Neurological:     Mental Status: She is alert and oriented to person, place, and time.      ED Treatments / Results  Labs (all labs ordered are listed, but only abnormal results are displayed) Labs Reviewed  BASIC METABOLIC PANEL - Abnormal; Notable for the following components:      Result Value   Glucose, Bld 158 (*)    Creatinine, Ser 1.04 (*)    GFR calc non Af Amer 53 (*)    All other components within normal limits  URINALYSIS, ROUTINE W REFLEX MICROSCOPIC - Abnormal; Notable for the following components:   APPearance HAZY (*)    Hgb urine dipstick TRACE (*)    All other components within normal limits  URINALYSIS, MICROSCOPIC (REFLEX) - Abnormal; Notable for the following components:   Bacteria, UA RARE (*)    All other components within normal limits  CBC WITH DIFFERENTIAL/PLATELET  TROPONIN I    EKG EKG Interpretation  Date/Time:  Monday November 15 2018 14:09:05 EST Ventricular Rate:  82 PR Interval:    QRS Duration: 88 QT Interval:  371 QTC Calculation: 434 R Axis:   22 Text Interpretation:  Normal sinus rhythm Low voltage, precordial leads Abnormal T, consider ischemia, lateral leads Baseline wander in lead(s) V4 Confirmed by Rolan Bucco [phone]) on 11/15/2018 2:12:33 PM Also confirmed by Rolan Bucco [phone]), editor Barbette Hair [phone])  on 11/15/2018 2:33:40 PM   Radiology Dg Chest 2 View  Result Date: 11/15/2018 CLINICAL DATA:  Chest pain and cough for 6 months. EXAM: CHEST - 2 VIEW COMPARISON:  02/01/18. FINDINGS: Normal heart size. There  are patchy airspace densities and subsegmental atelectasis in the left midlung and left base. Increased opacity along the right paratracheal stripe with a suggestion of right hilar adenopathy. Compression fracture involving the T9 vertebra is again noted with loss of greater than 50% of the vertebral body height. IMPRESSION: 1.  Patchy opacities within the left midlung and left base which may represent pneumonia and or atelectasis. 2. Increased opacity along the right paratracheal stripe and right hilar region. Can not rule out adenopathy. Consider follow-up imaging with nonemergent contrast enhanced CT of the chest. Electronically Signed   By: Signa Kellaylor  Stroud M.D.   On: 11/15/2018 13:53    Procedures Procedures (including critical care time)  Medications Ordered in ED Medications  cefTRIAXone (ROCEPHIN) 1 g in sodium chloride 0.9 % 100 mL IVPB (1 g Intravenous New Bag/Given 11/15/18 1510)  azithromycin (ZITHROMAX) 500 mg in sodium chloride 0.9 % 250 mL IVPB (has no administration in time range)  metoCLOPramide (REGLAN) injection 10 mg (10 mg Intravenous Given 11/15/18 1508)     Initial Impression / Assessment and Plan / ED Course  I have reviewed the triage vital signs and the nursing notes.  Pertinent labs & imaging results that were available during my care of the patient were reviewed by me and considered in my medical decision making (see chart for details).     Patient is a 73 year old female who presents with cough and weakness.  Chest x-ray shows evidence of pneumonia.  She was started on IV antibiotics.  She is tachypneic but is maintaining normal oxygen saturations on a nasal cannula at 2 L/min.  Her other labs are non-concerning.  Her chest x-ray does show some increased adenopathy and at some point she will need a CT chest which can either be done as an inpatient or as an outpatient for follow-up.  I discussed this patient with Dr. Janee Mornhompson who has accepted [facility].  He does request influenza testing although if we did that test [facility], [facility] would have to be [facility].  Given this, Dr. Janee Mornhompson acknowledges that the patient can have the influenza test once [facility].  Final Clinical Impressions(s) / ED Diagnoses   Final diagnoses:  Community acquired pneumonia of left lung, unspecified part of lung    ED Discharge Orders    None       Rolan BuccoBelfi, Lonnetta Kniskern, MD 11/15/18 1531

## 2018-11-15 NOTE — ED Notes (Signed)
ED Provider at bedside. 

## 2018-11-15 NOTE — ED Notes (Signed)
C/o diff breathing  Is on home 02 part of the time, but past 2 days has been on it all the time,  Increased weakness, nausea and lower abd pain  X 1 year

## 2018-11-15 NOTE — ED Notes (Signed)
Patient transported to X-ray 

## 2018-11-15 NOTE — H&P (Signed)
Valerie Keller:[id number] DOB: [birth year] DOA: 11/15/2018     PCP: Sunnie Nielsen, DO   Outpatient Specialists:  NONE locally    Patient arrived to ER on 11/15/18 at 1233  Patient coming from: home Lives alone,     With family    Chief Complaint:  Chief Complaint  Patient presents with  . Abdominal Pain    HPI: Valerie Keller is a 73 y.o. female with medical history significant of  DM 2, fibromyalgia, coronary artery disease status post stent placement, obesity, CKD stage 2, OSA, COPD, GERD, hx of kidney stones    Presented to [facility] with  4 to 5-day history of worsening shortness of breath, had to use her oxygen constantly, generalized weakness, increased O2 requirements.  Noted to be tachypneic, she has occasional wheeze but has been more audible. No chest pain. No significant sputum No sick contacts  no fever.   She had a fall tripped on the rug hit her hand and shoulder and face  She is not on prednisone   They are originally from [place] but have lived in [place] then [place] and moved to [place] only few months ago. have not established care with pulmonology Only got a PCP 1 week ago.    Accepted to tele bed  Regarding pertinent Chronic problems:    Systolic CHF last echo 2019  ventricular ejection fraction is mildly reduced (45-50%) The left ventricular diastolic function is abnormal. CAD used to be on Plavix now on baby aspirin   years ago she was diagnosed with COPD but have seen pulmonologist who told them she does not She is on 4L of O2 at baseline but only uses as needed She has a CPAP but could not get a good seal.   While in ER: Found to have  left multilobar pneumonia.   Patient given a dose of IV Rocephin and IV azithromycin.  placed on droplet precautions On arrival swabbed for PCR  The following Work up has been ordered so far:  Orders Placed This Encounter  Procedures  . Culture, Urine  . DG Chest 2 View  . Basic metabolic panel  . CBC  with Differential  . Urinalysis, Routine w reflex microscopic  . Troponin I - Once  . Urinalysis, Microscopic (reflex)  . Urinalysis, Routine w reflex microscopic  . Patient may eat/drink  . Cardiac monitoring  . Consult to hospitalist  . Droplet Isolation  . ED EKG  . EKG 12-Lead  . Admit to Inpatient (patient's expected length of stay will be greater than 2 midnights or inpatient only procedure)    Following Medications were ordered in ER: Medications  0.9 %  sodium chloride infusion ( Intravenous New Bag/Given 11/15/18 1549)  metoCLOPramide (REGLAN) injection 10 mg (10 mg Intravenous Given 11/15/18 1508)  cefTRIAXone (ROCEPHIN) 1 g in sodium chloride 0.9 % 100 mL IVPB ( Intravenous Stopped 11/15/18 1542)  azithromycin (ZITHROMAX) 500 mg in sodium chloride 0.9 % 250 mL IVPB (500 mg Intravenous New Bag/Given 11/15/18 1546)  azithromycin (ZITHROMAX) 500 MG injection (  Return to [facility] 11/15/18 1550)    Significant initial  Findings: Abnormal Labs Reviewed  BASIC METABOLIC PANEL - Abnormal; Notable for the following components:      Result Value   Glucose, Bld 158 (*)    Creatinine, Ser 1.04 (*)    GFR calc non Af Amer 53 (*)    All other components within normal limits  URINALYSIS, ROUTINE W REFLEX MICROSCOPIC -  Abnormal; Notable for the following components:   APPearance HAZY (*)    Hgb urine dipstick TRACE (*)    All other components within normal limits  URINALYSIS, MICROSCOPIC (REFLEX) - Abnormal; Notable for the following components:   Bacteria, UA RARE (*)    All other components within normal limits     Lactic Acid, Venous No results found for: LATICACIDVEN  Na 140 K 4.1  Cr  stable,    Lab Results  Component Value Date   CREATININE 1.04 (H) 11/15/2018   CREATININE 1.13 (H) 08/27/2018   CREATININE 1.18 (H) 02/15/2018      WBC 8.8  HG/HCT   stable,      Component Value Date/Time   HGB 14.5 11/15/2018 1423   HCT 44.7 11/15/2018 1423    Troponin   <0.03     UA  no evidence of UTI        CXR -   Patchy opacities within the left midlung and left base which may represent pneumonia      ECG:  Personally reviewed by me showing: HR : 82 Rhythm: NSR,   no evidence of ischemic changes [id number]    ED Triage Vitals  Enc Vitals Group     BP 11/15/18 1244 (!) 171/79     Pulse Rate 11/15/18 1244 85     Resp 11/15/18 1244 18     Temp 11/15/18 1244 (!) 97.5 F (36.4 C)     Temp Source 11/15/18 1244 Oral     SpO2 11/15/18 1244 94 %     Weight 11/15/18 1242 209 lb 14.1 oz (95.2 kg)     Height 11/15/18 1242 5\' 1"  (1.549 m)     Head Circumference --      Peak Flow --      Pain Score 11/15/18 1242 7     Pain Loc --      Pain Edu? --      Excl. in GC? --   TMAX(24)@       Latest  Blood pressure (!) 157/71, pulse 85, temperature 98.3 F (36.8 C), temperature source Oral, resp. rate (!) 30, height 4\' 11"  (1.499 m), weight 98.8 kg, SpO2 96 %.    Hospitalist was called for admission for acute on  chronic respiratory failure with hypoxia with abnormal chest x-ray suspicious for pneumonia   Review of Systems:    Pertinent positives include: fatigue,shortness of breath at rest.dyspnea on exertion,  Constitutional:  No weight loss, night sweats, Fevers, chills, weight loss  HEENT:  No headaches, Difficulty swallowing,Tooth/dental problems,Sore throat,  No sneezing, itching, ear ache, nasal congestion, post nasal drip,  Cardio-vascular:  No chest pain, Orthopnea, PND, anasarca, dizziness, palpitations.no Bilateral lower extremity swelling  GI:  No heartburn, indigestion, abdominal pain, nausea, vomiting, diarrhea, change in bowel habits, loss of appetite, melena, blood in stool, hematemesis Resp:  no  No excess mucus, no productive cough, No non-productive cough, No coughing up of blood.No change in color of mucus.No wheezing. Skin:  no rash or lesions. No jaundice GU:  no dysuria, change in color of urine, no urgency or  frequency. No straining to urinate.  No flank pain.  Musculoskeletal:  No joint pain or no joint swelling. No decreased range of motion. No back pain.  Psych:  No change in mood or affect. No depression or anxiety. No memory loss.  Neuro: no localizing neurological complaints, no tingling, no weakness, no double vision, no gait abnormality, no slurred speech, no confusion  All systems reviewed and apart from HOPI all are negative  Past Medical History:   Past Medical History:  Diagnosis Date  . Diabetes mellitus without complication (HCC)   . Fibromyalgia   . High blood pressure   . High cholesterol   . Lung disease   . Myocardial infarction (HCC)   . Renal disorder       Past Surgical History:  Procedure Laterality Date  . ABDOMINAL HYSTERECTOMY    . CHOLECYSTECTOMY    . CORONARY STENT PLACEMENT     x3  . FOOT SURGERY    . KIDNEY SURGERY    . TONSILLECTOMY      Social History:  Ambulatory  walker        reports that she has never smoked. She has never used smokeless tobacco. She reports that she does not drink alcohol or use drugs.     Family History:   Family History  Problem Relation Age of Onset  . CAD Brother   . Diabetes Neg Hx   . Cancer Neg Hx     Allergies: Allergies  Allergen Reactions  . Morphine Itching and Rash  . Ciprofloxacin Other (See Comments)    AMS  . Latex Hives and Other (See Comments)    Other reaction(s): Other (See Comments)  . Promethazine Other (See Comments)  . Ibuprofen   . Penicillins Hives    Did it involve swelling of the face/tongue/throat, SOB, or low BP? No Did it involve sudden or severe rash/hives, skin peeling, or any reaction on the inside of your mouth or nose? No Did you need to seek medical attention at a hospital or doctor's office? No When did it last happen? If all above answers are "NO", may proceed with cephalosporin use.      Prior to Admission medications   Medication Sig Start Date End Date  Taking? Authorizing Provider  albuterol (PROVENTIL) (2.5 MG/3ML) 0.083% nebulizer solution Take 3 mLs by nebulization every 4 (four) hours as needed for shortness of breath. 11/02/18 12/03/18  Sunnie Nielsen, DO  Alpha-Lipoic Acid 300 MG CAPS Take 1 capsule by mouth daily.    [provider]  ARIPiprazole (ABILIFY) 10 MG tablet Take 10 mg by mouth daily.    [provider]  aspirin 81 MG tablet Take 1 tablet (81 mg total) by mouth daily. 11/02/18   Sunnie Nielsen, DO  atorvastatin (LIPITOR) 40 MG tablet Take 1 tablet (40 mg total) by mouth at bedtime. 11/02/18   Sunnie Nielsen, DO  carvedilol (COREG) 6.25 MG tablet Take 1 tablet (6.25 mg total) by mouth 2 (two) times daily. 11/02/18 11/03/19  Sunnie Nielsen, DO  clonazePAM (KLONOPIN) 2 MG tablet Take 1-2 tablets by mouth 2 (two) times daily. 1 tablet in the AM and 2 tablets in the evening 01/09/18   [provider]  DULoxetine (CYMBALTA) 60 MG capsule Take 2 capsules by mouth daily. 12/01/17   [provider]  fluticasone Aleda Grana ALLERGY RELIEF) 50 MCG/ACT nasal spray Place 2 sprays into the nose daily as needed for allergies.    [provider]  furosemide (LASIX) 40 MG tablet Take 2 tablets (80 mg total) by mouth daily. 11/02/18   Sunnie Nielsen, DO  Insulin Pen Needle (PEN NEEDLES) 31G X 8 MM MISC For use with insulin pen qid as directed, please dispense per patient preference and insurance coverage 10/27/18   Sunnie Nielsen, DO  Ipratropium-Albuterol (COMBIVENT RESPIMAT) 20-100 MCG/ACT AERS respimat Inhale 1 puff into the lungs every  6 (six) hours as needed for wheezing. 11/02/18   Sunnie Nielsen, DO  levOCARNitine L-Tartrate (L-CARNITINE) 500 MG CAPS Take 500 mg by mouth daily. 05/14/15   [provider]  Magnesium Oxide 400 (240 Mg) MG TABS Take 1-2 tablets by mouth 2 (two) times daily. 1 tablet in the AM and 2 tablets in the evening 11/09/17   [provider]   metFORMIN (GLUCOPHAGE) 850 MG tablet Take 0.5 tablets (425 mg total) by mouth 2 (two) times daily. 11/02/18   Sunnie Nielsen, DO  Multiple Vitamin (MULTIVITAMIN) capsule Take 1 capsule by mouth daily.    [provider]  nitroGLYCERIN (NITROSTAT) 0.4 MG SL tablet Place 1 tablet (0.4 mg total) under the tongue every 5 (five) minutes as needed for chest pain. 11/02/18 11/03/19  Sunnie Nielsen, DO  potassium chloride SA (K-DUR,KLOR-CON) 20 MEQ tablet Take 20 mEq by mouth daily.    [provider]  rOPINIRole (REQUIP) 2 MG tablet Take 3 tablets by mouth at bedtime. 08/15/17 08/16/18  [provider]  spironolactone (ALDACTONE) 25 MG tablet Take 1 tablet (25 mg total) by mouth daily. 11/02/18   Sunnie Nielsen, DO  Vitamin D, Ergocalciferol, (DRISDOL) 1.25 MG (50000 UT) CAPS capsule Take 1 capsule (50,000 Units total) by mouth every 14 (fourteen) days. 11/02/18   Sunnie Nielsen, DO   Physical Exam: Blood pressure (!) 157/71, pulse 85, temperature 98.3 F (36.8 C), temperature source Oral, resp. rate (!) 30, height 4\' 11"  (1.499 m), weight 98.8 kg, SpO2 96 %. 1. General:  in  Acute distress increased work of breathing    Chronically ill  acutely ill -appearing 2. Psychological: Alert and   Oriented 3. Head/ENT:   Moist   Mucous Membranes                          Head Non traumatic, neck supple                           Poor Dentition 4. SKIN: normal  Skin turgor,  Skin clean Dry and intact no rash 5. Heart: Regular rate and rhythm no  Murmur, no Rub or gallop 6. Lungs: some  wheezes no  crackles distant breath sounds 7. Abdomen: Soft,  non-tender, Non distended  obese  bowel sounds present 8. Lower extremities: no clubbing, cyanosis, no  edema 9. Neurologically Grossly intact, moving all 4 extremities equally  10. MSK: Normal range of motion   LABS:     Recent Labs  Lab 11/15/18 1423  WBC 8.8  NEUTROABS 5.4  HGB 14.5  HCT 44.7  MCV 97.0  PLT 207    Basic Metabolic Panel: Recent Labs  Lab 11/15/18 1423  NA 140  K 4.1  CL 103  CO2 31  GLUCOSE 158*  BUN 15  CREATININE 1.04*  CALCIUM 9.9      No results for input(s): AST, ALT, ALKPHOS, BILITOT, PROT, ALBUMIN in the last 168 hours. No results for input(s): LIPASE, AMYLASE in the last 168 hours. No results for input(s): AMMONIA in the last 168 hours.    HbA1C: No results for input(s): HGBA1C in the last 72 hours. CBG: No results for input(s): GLUCAP in the last 168 hours.    Urine analysis:    Component Value Date/Time   COLORURINE YELLOW 11/15/2018 1435   APPEARANCEUR HAZY (A) 11/15/2018 1435   LABSPEC 1.010 11/15/2018 1435   PHURINE 7.0 11/15/2018 1435  GLUCOSEU NEGATIVE 11/15/2018 1435   HGBUR TRACE (A) 11/15/2018 1435   BILIRUBINUR NEGATIVE 11/15/2018 1435   KETONESUR NEGATIVE 11/15/2018 1435   PROTEINUR NEGATIVE 11/15/2018 1435   NITRITE NEGATIVE 11/15/2018 1435   LEUKOCYTESUR NEGATIVE 11/15/2018 1435      Cultures: No results found for: SDES, SPECREQUEST, CULT, REPTSTATUS   Radiological Exams on Admission: Dg Chest 2 View  Result Date: 11/15/2018 CLINICAL DATA:  Chest pain and cough for 6 months. EXAM: CHEST - 2 VIEW COMPARISON:  02/01/18. FINDINGS: Normal heart size. There are patchy airspace densities and subsegmental atelectasis in the left midlung and left base. Increased opacity along the right paratracheal stripe with a suggestion of right hilar adenopathy. Compression fracture involving the T9 vertebra is again noted with loss of greater than 50% of the vertebral body height. IMPRESSION: 1. Patchy opacities within the left midlung and left base which may represent pneumonia and or atelectasis. 2. Increased opacity along the right paratracheal stripe and right hilar region. Can not rule out adenopathy. Consider follow-up imaging with nonemergent contrast enhanced CT of the chest. Electronically Signed   By: Signa Kellaylor  Stroud M.D.   On: 11/15/2018 13:53     Chart has been reviewed    Assessment/Plan  73 y.o. female with medical history significant of  diabetes, fibromyalgia, coronary artery disease status post stent placement, obesity, CKD  Admitted for acute on  chronic respiratory failure with hypoxia with abnormal chest x-ray suspicious for pneumonia   Present on Admission: . Acute on chronic respiratory failure with hypoxia (HCC) likely multifactorial combination of pneumonia, COPD exacerbation, restrictive lung disease and obesity related hypercapnia Moved to stepdown for closer monitoring given increased oxygen requirement and hypercapnia rest on BiPAP during the night. Appreciate pulmonology consult in a.m. patient will need to have pulmonology follow-up as an outpatient as well.  Elevated d-dimer But no associated chest pain and gradual onset of shortness of breath of abnormal plain chest x-ray is most suggestive of COPD/pneumonia given patient habitus she is at risk for chronic blood clots will further evaluate with CT imaging  . COPD with acute exacerbation (HCC) -likely contributing to hypoxia hypercapnia.  -  - Will initiate: Steroid taper  -  Antibiotics  Doxycycline, - Albuterol  PRN, - scheduled duoneb,     -  Mucinex.  Titrate O2 to saturation >90%. Follow patients respiratory status.  Order  influenza PCR - PCCM consulted for e-link monitoring, and  consult in a.m.  -   BiPAP ordered  for increased work of breathing.  Currently mentating well no evidence of symptomatic hypercarbia CAP - - will admit for treatment of CAP will start on appropriate antibiotic coverage.   Obtain:  sputum cultures,                  Obtain  nfluenza serologies                 blood cultures if febrile or if decompensates.                   strep pneumo UA antigen,                                Provide oxygen   Need to follow-up imaging to further evaluate will probably benefit from further evaluation of underlying pulmonary  structures Order CT in AM   . Cardiomyopathy, ischemic -chronic continue home medications currently does not appear  to be fluid overloaded Need cardiology follow-up . Chronic kidney disease, stage 3 (moderate) (HCC) chronic continue to monitor avoid nephrotoxic medications  . Class 3 severe obesity due to excess calories in adult [facility](HCC) need nutritional consult as an outpatient  . Essential hypertension -chronic stable given some wheezing will hold off on Coreg for tonight restart if she improving versus consider switching to bisoprolol  . Hyperlipidemia, unspecified stable continue home medications . Obstructive sleep apnea (adult) (pediatric) will be on BiPAP currently but will need to have CPAP titration at home . Type 2 diabetes mellitus with hyperglycemia (HCC) -  - Order Sensitive  SSI   - continue home insulin regimen Lantus 30 units,  -  check TSH and HgA1C  - Hold by mouth medications      Other plan as per orders.  DVT prophylaxis:   Lovenox     Code Status:  DNR/DNI  as per patient   I had personally discussed CODE STATUS with patient   Family Communication:   Family at  Bedside  plan of care was discussed with , Husband,    Disposition Plan:         To home once workup is complete and patient is stable                      Would benefit from PT/OT eval prior to DC  Ordered                                       Consults called: Pulmonology to see in AM  Admission status:  inpatient     Expect 2 midnight stay secondary to severity of patient's current illness including   hemodynamic instability despite optimal treatment (    tachypnea  hypoxia, hypercapnia)  Severe lab/radiological/exam abnormalities including:   hypercarbia   and extensive comorbidities including:    DM2    CHF   CAD  COPD    CKD .    That are currently affecting medical management.   I expect  patient to be hospitalized for 2 midnights requiring inpatient medical care.  Patient is at  high risk for adverse outcome (such as loss of life or disability) if not treated.  Indication for inpatient stay as follows:    Hemodynamic instability despite maximal medical therapy,    New or worsening hypoxia  Need for IV antibiotics      Level of care      SDU tele indefinitely please discontinue once patient no longer qualifies    Monta Police 11/15/2018, 9:31 PM    [facility]     after 2 AM please page floor coverage PA If 7AM-7PM, please contact the day team taking care of the patient using [email or web address]

## 2018-11-15 NOTE — ED Triage Notes (Signed)
Abdominal pain and nausea. She has had it for a while. SOB. States her MD is aware. She has a headache.

## 2018-11-15 NOTE — Progress Notes (Signed)
Received pt from [facility], stable condition. Pt VS noted , MD updated of patient arrival, pt placed on Telemetry. Lab collected, specimen collected, Resp called to assess and give pt treatment. Husband arrived at bedside. Pt placed on Oxygen at 4 liters sats 95-96 %. Will cont to monitor. SRP, RN

## 2018-11-15 NOTE — Progress Notes (Signed)
Was called by EDP from [facility] patient 73 year old female she of diabetes, fibromyalgia, coronary artery disease status post stent placement, obesity presented to the ED with a 4 to 5-day history of worsening shortness of breath, generalized weakness, increased O2 requirements.  Patient noted to be tachypneic and noted to have rhonchi on examination.  Chest x-ray done consistent with a left multilobar pneumonia.  Patient given a dose of IV Rocephin and IV azithromycin.  Patient placed on droplet precautions.  Patient will need influenza PCR ordered once patient arrives at [facility] as influenza PCR at [facility] is a send out test.  Patient accepted to telemetry.  No charge.

## 2018-11-16 ENCOUNTER — Inpatient Hospital Stay (HOSPITAL_COMMUNITY): Payer: Medicare Other

## 2018-11-16 ENCOUNTER — Encounter (HOSPITAL_COMMUNITY): Payer: Self-pay | Admitting: Radiology

## 2018-11-16 DIAGNOSIS — Z6841 Body Mass Index (BMI) 40.0 and over, adult: Secondary | ICD-10-CM

## 2018-11-16 LAB — GLUCOSE, CAPILLARY
Glucose-Capillary: 148 mg/dL — ABNORMAL HIGH (ref 70–99)
Glucose-Capillary: 224 mg/dL — ABNORMAL HIGH (ref 70–99)
Glucose-Capillary: 295 mg/dL — ABNORMAL HIGH (ref 70–99)
Glucose-Capillary: 297 mg/dL — ABNORMAL HIGH (ref 70–99)
Glucose-Capillary: 299 mg/dL — ABNORMAL HIGH (ref 70–99)
Glucose-Capillary: 332 mg/dL — ABNORMAL HIGH (ref 70–99)

## 2018-11-16 LAB — COMPREHENSIVE METABOLIC PANEL
ALT: 27 U/L (ref 0–44)
AST: 22 U/L (ref 15–41)
Albumin: 3.6 g/dL (ref 3.5–5.0)
Alkaline Phosphatase: 65 U/L (ref 38–126)
Anion gap: 6 (ref 5–15)
BILIRUBIN TOTAL: 0.5 mg/dL (ref 0.3–1.2)
BUN: 15 mg/dL (ref 8–23)
CO2: 29 mmol/L (ref 22–32)
Calcium: 9.3 mg/dL (ref 8.9–10.3)
Chloride: 105 mmol/L (ref 98–111)
Creatinine, Ser: 1.02 mg/dL — ABNORMAL HIGH (ref 0.44–1.00)
GFR calc Af Amer: >60 mL/min (ref >60)
GFR calc non Af Amer: 55 mL/min — ABNORMAL LOW (ref >60)
GLUCOSE: 158 mg/dL — AB (ref 70–99)
Potassium: 4.6 mmol/L (ref 3.5–5.1)
Sodium: 140 mmol/L (ref 135–145)
TOTAL PROTEIN: 7.1 g/dL (ref 6.5–8.1)

## 2018-11-16 LAB — CBC
HCT: 47 % — ABNORMAL HIGH (ref 36.0–46.0)
Hemoglobin: 15.2 g/dL — ABNORMAL HIGH (ref 12.0–15.0)
MCH: 32.3 pg (ref 26.0–34.0)
MCHC: 32.3 g/dL (ref 30.0–36.0)
MCV: 99.8 fL (ref 80.0–100.0)
Platelets: 184 10*3/uL (ref 150–400)
RBC: 4.71 MIL/uL (ref 3.87–5.11)
RDW: 13.6 % (ref 11.5–15.5)
WBC: 10.5 10*3/uL (ref 4.0–10.5)
nRBC: 0 % (ref 0.0–0.2)

## 2018-11-16 LAB — TROPONIN I
Troponin I: <0.03 ng/mL (ref <0.03)
Troponin I: <0.03 ng/mL (ref <0.03)

## 2018-11-16 LAB — TSH: TSH: 1.128 u[IU]/mL (ref 0.350–4.500)

## 2018-11-16 LAB — BLOOD GAS, ARTERIAL
Acid-Base Excess: 1.4 mmol/L (ref 0.0–2.0)
Bicarbonate: 29.7 mmol/L — ABNORMAL HIGH (ref 20.0–28.0)
Delivery systems: POSITIVE
Drawn by: 235321
O2 Content: 6 L/min
O2 Saturation: 98.6 %
PO2 ART: 140 mmHg — AB (ref 83.0–108.0)
Patient temperature: 98.6
pCO2 arterial: 65.3 mmHg (ref 32.0–48.0)
pH, Arterial: 7.281 — ABNORMAL LOW (ref 7.350–7.450)

## 2018-11-16 LAB — APTT: APTT: 29 s (ref 24–36)

## 2018-11-16 LAB — HIV ANTIBODY (ROUTINE TESTING W REFLEX): HIV Screen 4th Generation wRfx: NONREACTIVE

## 2018-11-16 LAB — PROTIME-INR
INR: 1.03
Prothrombin Time: 13.4 seconds (ref 11.4–15.2)

## 2018-11-16 LAB — HEPARIN LEVEL (UNFRACTIONATED): Heparin Unfractionated: 0.59 IU/mL (ref 0.30–0.70)

## 2018-11-16 LAB — MAGNESIUM: Magnesium: 2.2 mg/dL (ref 1.7–2.4)

## 2018-11-16 LAB — MRSA PCR SCREENING: MRSA by PCR: NEGATIVE

## 2018-11-16 LAB — STREP PNEUMONIAE URINARY ANTIGEN: Strep Pneumo Urinary Antigen: NEGATIVE

## 2018-11-16 LAB — PHOSPHORUS: Phosphorus: 3.6 mg/dL (ref 2.5–4.6)

## 2018-11-16 LAB — HEMOGLOBIN A1C
Hgb A1c MFr Bld: 7.2 % — ABNORMAL HIGH (ref 4.8–5.6)
Mean Plasma Glucose: 159.94 mg/dL

## 2018-11-16 MED ORDER — METOPROLOL SUCCINATE ER 25 MG PO TB24
12.5000 mg | ORAL_TABLET | Freq: Every day | ORAL | Status: DC
  Administered 2018-11-16 – 2018-11-18 (×3): 12.5 mg via ORAL
  Filled 2018-11-16 (×3): qty 1

## 2018-11-16 MED ORDER — HEPARIN BOLUS VIA INFUSION
1700.0000 [IU] | Freq: Once | INTRAVENOUS | Status: AC
  Administered 2018-11-16: 1700 [IU] via INTRAVENOUS
  Filled 2018-11-16: qty 1700

## 2018-11-16 MED ORDER — IOPAMIDOL (ISOVUE-370) INJECTION 76%
100.0000 mL | Freq: Once | INTRAVENOUS | Status: AC | PRN
  Administered 2018-11-16: 100 mL via INTRAVENOUS

## 2018-11-16 MED ORDER — HEPARIN (PORCINE) 25000 UT/250ML-% IV SOLN
750.0000 [IU]/h | INTRAVENOUS | Status: DC
  Administered 2018-11-16: 1000 [IU]/h via INTRAVENOUS
  Filled 2018-11-16: qty 250

## 2018-11-16 MED ORDER — SODIUM CHLORIDE (PF) 0.9 % IJ SOLN
INTRAMUSCULAR | Status: AC
  Filled 2018-11-16: qty 50

## 2018-11-16 MED ORDER — IPRATROPIUM-ALBUTEROL 0.5-2.5 (3) MG/3ML IN SOLN
3.0000 mL | Freq: Three times a day (TID) | RESPIRATORY_TRACT | Status: DC
  Administered 2018-11-16 – 2018-11-17 (×3): 3 mL via RESPIRATORY_TRACT
  Filled 2018-11-16 (×3): qty 3

## 2018-11-16 MED ORDER — ENSURE ENLIVE PO LIQD: 237.0000 mL | Freq: Every day | ORAL | Status: DC | PRN

## 2018-11-16 MED ORDER — IOPAMIDOL (ISOVUE-370) INJECTION 76%
INTRAVENOUS | Status: AC
  Filled 2018-11-16: qty 100

## 2018-11-16 NOTE — Progress Notes (Signed)
OT Cancellation Note  Patient Details Name: Valerie Keller MRN: [id number] DOB: [birth year]   Cancelled Treatment:    Reason Eval/Treat Not Completed: Medical issues which prohibited therapy.  Cx:  CT angio ordered. Will check back later today or tomorrow as schedule permits.  Chassity Ludke 11/16/2018, 7:27 AM  Marica Otter, OTR/L Acute Rehabilitation Services [phone] [facility] pager [phone] office 11/16/2018

## 2018-11-16 NOTE — Progress Notes (Signed)
PT Cancellation Note  Patient Details Name: Valerie Keller MRN: [id number] DOB: 1945/12/13   Cancelled Treatment:    Reason Eval/Treat Not Completed: Medical issues which prohibited therapy, to R/O  PE. Check back when cleared.   Rada Hay 11/16/2018, 7:52 AM [name] PT Acute Rehabilitation Services Pager [phone] Office [phone]

## 2018-11-16 NOTE — Progress Notes (Signed)
ANTICOAGULATION CONSULT NOTE   Pharmacy Consult for Heparin infusion Indication: pulmonary embolus  Allergies  Allergen Reactions  . Morphine Itching and Rash  . Ciprofloxacin Other (See Comments)    AMS  . Latex Hives and Other (See Comments)    Other reaction(s): Other (See Comments)  . Promethazine Other (See Comments)  . Ibuprofen   . Penicillins Hives    Did it involve swelling of the face/tongue/throat, SOB, or low BP? No Did it involve sudden or severe rash/hives, skin peeling, or any reaction on the inside of your mouth or nose? No Did you need to seek medical attention at a hospital or doctor's office? No When did it last happen? If all above answers are "NO", may proceed with cephalosporin use.    Patient Measurements: Height: 5\' 1"  (154.9 cm) Weight: 217 lb 9.5 oz (98.7 kg) IBW/kg (Calculated) : 47.8 Heparin Dosing Weight: 71.4  Vital Signs: Temp: 97.5 F (36.4 C) (01/28 0800) Temp Source: Oral (01/28 0800) BP: 98/63 (01/28 0600) Pulse Rate: 95 (01/28 0818)  Labs: Recent Labs    11/15/18 1423 11/15/18 1906 11/16/18 0126 11/16/18 0734  HGB 14.5  --  15.2*  --   HCT 44.7  --  47.0*  --   PLT 207  --  184  --   CREATININE 1.04*  --  1.02*  --   TROPONINI <0.03 <0.03 <0.03 <0.03   Estimated Creatinine Clearance: 52.9 mL/min (A) (by C-G formula based on SCr of 1.02 mg/dL (H)).  Medical History: Past Medical History:  Diagnosis Date  . Diabetes mellitus without complication (HCC)   . Fibromyalgia   . High blood pressure   . High cholesterol   . Lung disease   . Myocardial infarction (HCC)   . Renal disorder    Medications:  Scheduled:  . ARIPiprazole  10 mg Oral Daily  . aspirin EC  81 mg Oral Daily  . atorvastatin  40 mg Oral QHS  . DULoxetine  120 mg Oral Daily  . furosemide  40 mg Oral BID  . insulin aspart  0-9 Units Subcutaneous Q4H  . insulin glargine  30 Units Subcutaneous QHS  . iopamidol      . ipratropium-albuterol  3 mL  Nebulization Q6H  . [START ON 11/17/2018] predniSONE  40 mg Oral Q breakfast  . rOPINIRole  6 mg Oral QHS  . sodium chloride (PF)      . sodium chloride flush  3 mL Intravenous Q12H  . spironolactone  25 mg Oral Daily   Infusions:  . sodium chloride 75 mL/hr at 11/16/18 0844  . sodium chloride    . azithromycin    . cefTRIAXone (ROCEPHIN)  IV     Assessment: 72 yoF admit 1/27 with 4-5 day hx worsening ShOB. Chest CT: non-occlusive PE, begin Heparin PMH: DM2, Fibromyalgia, CAD/Stent, CKD2, OSA, COPD/O2, GERD, hx renal stones  Today, 11/16/2018  Discontinue SQ Lovenox  Note on Azithromycin, may increase Heparin levels  Goal of Therapy:  Heparin level 0.3-0.7 units/ml Monitor CBC   Plan:   Begin Heparin infusion with 1700 unit load, infusion at 1000 units/hr  Daily CBC, daily Hep level when at steady state  Check first Hep level in 8 hr after start  Otho Bellows PharmD Pager [phone] 11/16/2018, 1:11 PM

## 2018-11-16 NOTE — Progress Notes (Signed)
Pt admitted earlier on this shift. Found to have multi lobar PNA and hypercarbia. Bipap was ordered but she could not tolerate. Then, we tried Cpap (hx OSA) and she was unable to tolerate that for longer than a couple of hours. She is on O2 at home "as needed". Earlier ABG was venous and showed hypercarbia with compensation. ABG after being on CPAP x 2 hours had no significant change. RT changed her back to [place]. She is likely a chronic retainer and her PCO2 may always be in the 60s. She is satting fine on [place] and her mental status is normal.  KJKG, NP [facility]

## 2018-11-16 NOTE — Progress Notes (Signed)
1730 Patient appears to be a lttle confused this evening. Is oriented x4 but accusing RN of hiding her daughter behind the door. She told the NT that her family is playing tricks on her.  1854 Husband at bed side. Upset with RN because wife is not getting scheduled Clonazepam. Explained to the pts. Husband, that Clonazepam has been ordered PRN and is available for patient to take if anxious. He was also upset because Requip was ordered for bed time dose. Tried to explain to the pt and spouse that it was not the RN who puts in medication orders. RN administered a PRN dose of Clonipin.

## 2018-11-16 NOTE — Progress Notes (Signed)
PROGRESS NOTE  Valerie Moleeggy L Supak  ZOX:[id number] DOB: 03/17/1946 DOA: 11/15/2018 PCP: Sunnie NielsenAlexander, Natalie, DO   Brief Narrative: Valerie Keller is a 73 y.o. female with a history of suspected COPD on intermittent oxygen chronically, OSA, stage III CKD, obesity, T2DM, fibromyalgia, and CAD s/p PCI who presented to the ED with several days of shortness of breath with abrupt worsening the day of presentation. She's been using oxygen more than usual and yellow sputum, having fatigue, weakness. Also noted wheezing more than usual with some improvement with nebulized therapies. On evaluation in the ED she appeared to have increased work of breathing and was admitted for pneumonia to [facility]. D-dimer was noted to be elevated and subsequent CTA chest demonstrates a pulmonary embolism. Heparin is started.   Assessment & Plan: Active Problems:   Cardiomyopathy, ischemic   Chronic kidney disease, stage 3 (moderate) (HCC)   Coronary artery disease with history of myocardial infarction without history of CABG   Essential hypertension   Hyperlipidemia, unspecified   Class 3 severe obesity due to excess calories in adult [facility](HCC)   Obstructive sleep apnea (adult) (pediatric)   Type 2 diabetes mellitus with hyperglycemia (HCC)   Acute on chronic respiratory failure with hypoxia (HCC)   COPD with acute exacerbation (HCC)   Hypercapnia  Acute on chronic hypoxic respiratory failure: Due to PNA and PE on baseline ?COPD, OSA and OHS.  - Continue supplemental oxygen as needed to maintain SpO2 90-95%. - BiPAP prn.  Right inferior pulmonary embolus: RV/LV wnl. The patient is a nebulous historian but does confirm an abrupt onset of dyspnea prompting presentation in addition to gradual component. - Start heparin gtt - Check echocardiogram  Community acquired pneumonia: Flu negative.  - Stop ceftriaxone, continue azithromycin. No focal infiltrate on CXR and now alternative explanation found for dyspnea.  - Monitor  cultures  Suspected COPD:  - Continue doxycycline, steroids, nebs (scheduled and prn) - Discussed with pulmonology who have arranged follow up for further evaluation and management as outpatient/formal pulmonary testing. Blood gases indicate some hypercarbia which is suspected to be chronic with high-normal CO2 on metabolic panel and no lethargy/encephalopathy.   CAD s/p PCI: Denies chest pain. ECG nonischemic. - Continue aspirin, statin, BB. Switch coreg to metoprolol for cardioselectivity  Chronic HFrEF, ICM, HTN: Modestly reduced EF on echo by report. - Monitor I/O, daily weights - Continue beta blocker, lasix BID, has been on losartan as outpatient per records, will restart if LV systolic dysfunction confirmed.   Stage III CKD:  - Avoid nephrotoxins.  IDT2DM: HbA1c 7.2% indicating much improved control (Previous values in 2019 were 12%, 11.2%, 8.7%). Has seen Gavin PoundMeagan Cockfield, NP with endocrinology with [facility] in [place].  - Continue home lantus 30u and started SSI in place of home metformin. Was prescribed humalog 7u TIDAC  Depression: Under care of psychiatry as outpatient, appears to be weaning down on medications.  - Continue home meds.   Morbid obesity: BMI 41 - Continue weight loss efforts as outpatient.   DVT prophylaxis: Heparin gtt Code Status: DNR Family Communication: Husband at bedside Disposition Plan: Home once stabilized.  Consultants:   None  Procedures:   Echocardiogram ordered  Antimicrobials:  Ceftriaxone, azithromycin   Subjective: Feels breathing is still difficult, modestly improved from admission, but significantly worse than baseline per patient and husband. No chest pain.   Objective: Vitals:   11/16/18 0400 11/16/18 0600 11/16/18 0800 11/16/18 0818  BP: 140/68 98/63    Pulse:  90  95  Resp: 17 12  18   Temp:   (!) 97.5 F (36.4 C)   TempSrc:   Oral   SpO2:  95%  95%  Weight:      Height:        Intake/Output Summary (Last 24  hours) at 11/16/2018 1257 Last data filed at 11/16/2018 0844 Gross per 24 hour  Intake 1647.98 ml  Output 1050 ml  Net 597.98 ml   Filed Weights   11/15/18 1242 11/15/18 1810 11/15/18 2251  Weight: 95.2 kg 98.8 kg 98.7 kg    Gen: 73 y.o. female in no distress  Pulm: Incresaed WOB, tachypneic with supplemental oxygen. Scant end expiratory wheezing bilaterally.  CV: Regular rate and rhythm. No murmur, rub, or gallop. No JVD, no pedal edema. GI: Abdomen soft, non-tender, non-distended, with normoactive bowel sounds. No organomegaly or masses felt. Ext: Warm, no deformities Skin: No rashes, lesions or ulcers Neuro: Alert and oriented. No focal neurological deficits. Psych: Judgement and insight appear normal. Mood & affect appropriate.   Data Reviewed: I have personally reviewed following labs and imaging studies  CBC: Recent Labs  Lab 11/15/18 1423 11/16/18 0126  WBC 8.8 10.5  NEUTROABS 5.4  --   HGB 14.5 15.2*  HCT 44.7 47.0*  MCV 97.0 99.8  PLT 207 184   Basic Metabolic Panel: Recent Labs  Lab 11/15/18 1423 11/16/18 0126  NA 140 140  K 4.1 4.6  CL 103 105  CO2 31 29  GLUCOSE 158* 158*  BUN 15 15  CREATININE 1.04* 1.02*  CALCIUM 9.9 9.3  MG  --  2.2  PHOS  --  3.6   GFR: Estimated Creatinine Clearance: 52.9 mL/min (A) (by C-G formula based on SCr of 1.02 mg/dL (H)). Liver Function Tests: Recent Labs  Lab 11/16/18 0126  AST 22  ALT 27  ALKPHOS 65  BILITOT 0.5  PROT 7.1  ALBUMIN 3.6   No results for input(s): LIPASE, AMYLASE in the last 168 hours. No results for input(s): AMMONIA in the last 168 hours. Coagulation Profile: No results for input(s): INR, PROTIME in the last 168 hours. Cardiac Enzymes: Recent Labs  Lab 11/15/18 1423 11/15/18 1906 11/16/18 0126 11/16/18 0734  TROPONINI <0.03 <0.03 <0.03 <0.03   BNP (last 3 results) No results for input(s): PROBNP in the last 8760 hours. HbA1C: Recent Labs    11/15/18 1906  HGBA1C 7.2*    CBG: Recent Labs  Lab 11/15/18 2018 11/15/18 2302 11/16/18 0309 11/16/18 0801 11/16/18 1237  GLUCAP 84 144* 148* 224* 295*   Lipid Profile: No results for input(s): CHOL, HDL, LDLCALC, TRIG, CHOLHDL, LDLDIRECT in the last 72 hours. Thyroid Function Tests: Recent Labs    11/16/18 0126  TSH 1.128   Anemia Panel: No results for input(s): VITAMINB12, FOLATE, FERRITIN, TIBC, IRON, RETICCTPCT in the last 72 hours. Urine analysis:    Component Value Date/Time   COLORURINE YELLOW 11/15/2018 1435   APPEARANCEUR HAZY (A) 11/15/2018 1435   LABSPEC 1.010 11/15/2018 1435   PHURINE 7.0 11/15/2018 1435   GLUCOSEU NEGATIVE 11/15/2018 1435   HGBUR TRACE (A) 11/15/2018 1435   BILIRUBINUR NEGATIVE 11/15/2018 1435   KETONESUR NEGATIVE 11/15/2018 1435   PROTEINUR NEGATIVE 11/15/2018 1435   NITRITE NEGATIVE 11/15/2018 1435   LEUKOCYTESUR NEGATIVE 11/15/2018 1435   Recent Results (from the past 240 hour(s))  Culture, sputum-assessment     Status: None   Collection Time: 11/15/18  6:44 PM  Result Value Ref Range Status   Specimen Description EXPECTORATED  SPUTUM  Final   Special Requests Normal  Final   Sputum evaluation   Final    THIS SPECIMEN IS ACCEPTABLE FOR SPUTUM CULTURE Performed at [facility], [address] [place], [place] [address]    Report Status 11/15/2018 FINAL  Final  Culture, respiratory     Status: None (Preliminary result)   Collection Time: 11/15/18  6:44 PM  Result Value Ref Range Status   Specimen Description   Final    EXPECTORATED SPUTUM Performed at [facility], [address] [place], [place] [address]    Special Requests   Final    Normal Reflexed from [id number] Performed at [facility], [address] [place], [place] [address]    Gram Stain   Final    ABUNDANT WBC PRESENT, PREDOMINANTLY PMN MODERATE GRAM POSITIVE RODS FEW GRAM POSITIVE COCCI RARE YEAST WITH PSEUDOHYPHAE Performed at  [facility], [address] [place], [place] [address]    Culture PENDING  Incomplete   Report Status PENDING  Incomplete  Culture, blood (routine x 2) Call MD if unable to obtain prior to antibiotics being given     Status: None (Preliminary result)   Collection Time: 11/15/18  7:06 PM  Result Value Ref Range Status   Specimen Description   Final    BLOOD RIGHT HAND Performed at [facility], [address] [place], [place] [address]    Special Requests   Final    BOTTLES DRAWN AEROBIC AND ANAEROBIC Blood Culture adequate volume Performed at [facility], [address] [place], [place] [address]    Culture   Final    NO GROWTH < 12 HOURS Performed at [facility], [address] [place], [place] [address]    Report Status PENDING  Incomplete  Culture, blood (routine x 2) Call MD if unable to obtain prior to antibiotics being given     Status: None (Preliminary result)   Collection Time: 11/15/18  7:15 PM  Result Value Ref Range Status   Specimen Description   Final    BLOOD RIGHT ANTECUBITAL Performed at [facility], [address] [place], [place] [address]    Special Requests   Final    BOTTLES DRAWN AEROBIC AND ANAEROBIC Blood Culture adequate volume Performed at [facility], [address] [place], [place] [address]    Culture   Final    NO GROWTH < 12 HOURS Performed at [facility], [address] [place], [place] [address]    Report Status PENDING  Incomplete  MRSA PCR Screening     Status: None   Collection Time: 11/16/18 12:58 AM  Result Value Ref Range Status   MRSA by PCR NEGATIVE NEGATIVE Final    Comment:        The GeneXpert MRSA Assay (FDA approved for NASAL specimens only), is one component of a comprehensive MRSA colonization surveillance program. It is not intended to diagnose MRSA infection nor to guide or monitor treatment for MRSA  infections. Performed at [facility], [address] [place], [place] [address]       Radiology Studies: Dg Chest 2 View  Result Date: 11/15/2018 CLINICAL DATA:  Chest pain and cough for 6 months. EXAM: CHEST - 2 VIEW COMPARISON:  02/01/18. FINDINGS: Normal heart size. There are patchy airspace densities and subsegmental atelectasis in the left midlung and left base. Increased opacity along  the right paratracheal stripe with a suggestion of right hilar adenopathy. Compression fracture involving the T9 vertebra is again noted with loss of greater than 50% of the vertebral body height. IMPRESSION: 1. Patchy opacities within the left midlung and left base which may represent pneumonia and or atelectasis. 2. Increased opacity along the right paratracheal stripe and right hilar region. Can not rule out adenopathy. Consider follow-up imaging with nonemergent contrast enhanced CT of the chest. Electronically Signed   By: Signa Kell M.D.   On: 11/15/2018 13:53   Ct Angio Chest Pe W Or Wo Contrast  Addendum Date: 11/16/2018   ADDENDUM REPORT: 11/16/2018 12:22 ADDENDUM: Critical Value/emergent results were called by telephone at the time of interpretation on 11/16/2018 at 12:22 pm to Dr. Jarvis Newcomer, who verbally acknowledged these results. Electronically Signed   By: Lupita Raider, M.D.   On: 11/16/2018 12:22   Result Date: 11/16/2018 CLINICAL DATA:  Shortness of breath. EXAM: CT ANGIOGRAPHY CHEST WITH CONTRAST TECHNIQUE: Multidetector CT imaging of the chest was performed using the standard protocol during bolus administration of intravenous contrast. Multiplanar CT image reconstructions and MIPs were obtained to evaluate the vascular anatomy. CONTRAST:  ISOVUE-370 IOPAMIDOL (ISOVUE-370) INJECTION 76% COMPARISON:  Radiographs of November 15, 2018. FINDINGS: Cardiovascular: Small linear filling defect is noted in lower lobe branch of right pulmonary artery concerning for nonocclusive  pulmonary embolus. RV/LV ratio of 0.6 is noted which is within normal limits. Atherosclerosis of thoracic aorta is noted without aneurysm or dissection. Coronary artery calcifications are noted. No pericardial effusion is noted. Mediastinum/Nodes: No enlarged mediastinal, hilar, or axillary lymph nodes. Thyroid gland, trachea, and esophagus demonstrate no significant findings. Lungs/Pleura: No pneumothorax is noted. Mild bilateral pleural effusions are noted with adjacent subsegmental atelectasis of both lower lobes. Upper Abdomen: No acute abnormality. Musculoskeletal: Old lower thoracic vertebral body fracture is noted. No acute osseous abnormality is noted. Review of the MIP images confirms the above findings. IMPRESSION: Small linear filling defect seen in lower lobe branch of right pulmonary artery consistent with nonocclusive pulmonary embolus. Coronary artery calcifications are noted suggesting coronary artery disease. Mild bilateral pleural effusions are noted with adjacent subsegmental atelectasis of both lower lobes. Old lower thoracic vertebral body fracture is noted. Aortic Atherosclerosis (ICD10-I70.0). Electronically Signed: By: Lupita Raider, M.D. On: 11/16/2018 12:10    Scheduled Meds: . ARIPiprazole  10 mg Oral Daily  . aspirin EC  81 mg Oral Daily  . atorvastatin  40 mg Oral QHS  . DULoxetine  120 mg Oral Daily  . enoxaparin (LOVENOX) injection  40 mg Subcutaneous Q24H  . furosemide  40 mg Oral BID  . insulin aspart  0-9 Units Subcutaneous Q4H  . insulin glargine  30 Units Subcutaneous QHS  . iopamidol      . ipratropium-albuterol  3 mL Nebulization Q6H  . [START ON 11/17/2018] predniSONE  40 mg Oral Q breakfast  . rOPINIRole  6 mg Oral QHS  . sodium chloride (PF)      . sodium chloride flush  3 mL Intravenous Q12H  . spironolactone  25 mg Oral Daily   Continuous Infusions: . sodium chloride 75 mL/hr at 11/16/18 0844  . sodium chloride    . azithromycin    . cefTRIAXone  (ROCEPHIN)  IV       LOS: 1 day   Time spent: 35 minutes.  Tyrone Nine, MD [facility] [email or web address] Password [facility] 11/16/2018, 12:57 PM

## 2018-11-16 NOTE — Progress Notes (Signed)
Initial Nutrition Assessment  DOCUMENTATION CODES:   Morbid obesity  INTERVENTION:  - Will order Ensure Enlive once/day PRN, each supplement provides 350 kcal and 20 grams of protein. - Continue to encourage PO intakes.   NUTRITION DIAGNOSIS:   Inadequate oral intake related to decreased appetite, other (see comment)(depression) as evidenced by per patient/family report.  GOAL:   Patient will meet greater than or equal to 90% of their needs  MONITOR:   PO intake, Supplement acceptance, Weight trends, Labs  REASON FOR ASSESSMENT:   Consult Assessment of nutrition requirement/status  ASSESSMENT:   73 y.o. female with medical history significant of type 2 DM, fibromyalgia, CAD s/p stent placement, obesity, CKD stage 2, OSA, COPD, GERD, and hx of kidney stones. She presented to the ED with 4-5 days of worsening SOB, generalized weakness, increased O2 requirements. PTA, she tripped on a rug and fell; hit her hand, shoulder, and face.  No intakes documented since admission. Patient reports eating nearly all of breakfast which consisted of an omelet, orange sherbet, and water. She requested RD help her in ordering lunch. Ordered items per preferences: salmon, baked potato with butter, coleslaw, orange sherbet, and whole milk.  Patient denies any abdominal pain/pressure or nausea now or PTA. She denies any chewing or swallowing issues. She states that appetite was good some days and not good other days PTA; she states this is d/t depression.   Per chart review, current weight is 217 lb and weight on 02/01/18 was 212 lb (the lowest in the past 10 months).   Medications reviewed; 40 mg oral lasix BID, sliding scale novolog, 30 units lantus/day, 10 mg IV reglan x1 dose 1/27, 60 mg solu-medrol x2 doses 1/27, 40 mg deltasone/day starting 1/29, 25 mg aldactone/day.  Labs reviewed; CBGs: 148 and 224 mg/dl today, creatinine: 3.88 mg/dl, GFR: 55 ml/min.  IVF; NS @ 75 ml/hr.     NUTRITION -  FOCUSED PHYSICAL EXAM:  Completed to upper body only; no muscle and no fat wasting.   Diet Order:   Diet Order            Diet regular Room service appropriate? Yes; Fluid consistency: Thin  Diet effective now              EDUCATION NEEDS:   No education needs have been identified at this time  Skin:  Skin Assessment: Reviewed RN Assessment  Last BM:  PTA/unknown  Height:   Ht Readings from Last 1 Encounters:  11/15/18 5\' 1"  (1.549 m)    Weight:   Wt Readings from Last 1 Encounters:  11/15/18 98.7 kg    Ideal Body Weight:  47.73 kg  BMI:  Body mass index is 41.11 kg/m.  Estimated Nutritional Needs:   Kcal:  1500-1700 kcal  Protein:  70-80 grams  Fluid:  >/= 1.5 L/day     Trenton Gammon, MS, RD, LDN, [facility] Inpatient Clinical Dietitian Pager # [phone] After hours/weekend pager # [phone]

## 2018-11-16 NOTE — Progress Notes (Signed)
Patient declines the use of nocturnal CPAP tonight. Equipment remains at the bedside if she should change her mind. RT will continue to follow. RN aware.

## 2018-11-16 NOTE — Care Management Note (Signed)
Case Management Note  Patient Details  Name: KHYLAH ISEMAN MRN: [id number] Date of Birth: [birth year]  Subjective/Objective:                  Discharge Readiness Return to top of Chronic Obstructive Pulmonary Disease RRG - ISC  Discharge readiness is indicated by patient meeting Recovery Milestones, including ALL of the following: ? Hemodynamic stability YES ? Mental status at baseline YES ? No evidence of infection, or outpatient treatment planned YES ? Eating and sleeping without frequent dyspnea NO/ BI-PAP AT ALL TIME ? Breathing comfortably at rest NO ? Ambulatory NO ? Oral hydration, medications, and diet ? IV SOLU-MEDROL/IV ZITHROMAX/IV ROCEPHIN ? LEVEL OF CARE---SDU   Action/Plan: Will follow for progression of care and clinical status. Will follow for case management needs none present at this time.  Expected Discharge Date:  (unknown)               Expected Discharge Plan:     In-House Referral:     Discharge planning Services     Post Acute Care Choice:    Choice offered to:     DME Arranged:    DME Agency:     HH Arranged:    HH Agency:     Status of Service:     If discussed at [employer], dates discussed:    Additional Comments:  Golda Acre, RN 11/16/2018, 9:02 AM

## 2018-11-17 ENCOUNTER — Inpatient Hospital Stay (HOSPITAL_COMMUNITY): Payer: Medicare Other

## 2018-11-17 DIAGNOSIS — I2699 Other pulmonary embolism without acute cor pulmonale: Secondary | ICD-10-CM

## 2018-11-17 LAB — GLUCOSE, CAPILLARY
Glucose-Capillary: 238 mg/dL — ABNORMAL HIGH (ref 70–99)
Glucose-Capillary: 266 mg/dL — ABNORMAL HIGH (ref 70–99)
Glucose-Capillary: 321 mg/dL — ABNORMAL HIGH (ref 70–99)
Glucose-Capillary: 290 mg/dL — ABNORMAL HIGH (ref 70–99)
Glucose-Capillary: 337 mg/dL — ABNORMAL HIGH (ref 70–99)

## 2018-11-17 LAB — URINE CULTURE

## 2018-11-17 LAB — CBC
HEMATOCRIT: 42.1 % (ref 36.0–46.0)
Hemoglobin: 13.3 g/dL (ref 12.0–15.0)
MCH: 31.3 pg (ref 26.0–34.0)
MCHC: 31.6 g/dL (ref 30.0–36.0)
MCV: 99.1 fL (ref 80.0–100.0)
Platelets: 196 10*3/uL (ref 150–400)
RBC: 4.25 MIL/uL (ref 3.87–5.11)
RDW: 13.6 % (ref 11.5–15.5)
WBC: 13.6 10*3/uL — ABNORMAL HIGH (ref 4.0–10.5)
nRBC: 0 % (ref 0.0–0.2)

## 2018-11-17 LAB — BASIC METABOLIC PANEL
Anion gap: 9 (ref 5–15)
BUN: 27 mg/dL — ABNORMAL HIGH (ref 8–23)
CO2: 29 mmol/L (ref 22–32)
Calcium: 9.1 mg/dL (ref 8.9–10.3)
Chloride: 101 mmol/L (ref 98–111)
Creatinine, Ser: 1.11 mg/dL — ABNORMAL HIGH (ref 0.44–1.00)
GFR calc Af Amer: 57 mL/min — ABNORMAL LOW (ref >60)
GFR calc non Af Amer: 49 mL/min — ABNORMAL LOW (ref >60)
Glucose, Bld: 279 mg/dL — ABNORMAL HIGH (ref 70–99)
Potassium: 4.2 mmol/L (ref 3.5–5.1)
Sodium: 139 mmol/L (ref 135–145)

## 2018-11-17 LAB — HEPARIN LEVEL (UNFRACTIONATED): Heparin Unfractionated: 0.88 IU/mL — ABNORMAL HIGH (ref 0.30–0.70)

## 2018-11-17 MED ORDER — GLUCERNA SHAKE PO LIQD
237.0000 mL | Freq: Three times a day (TID) | ORAL | Status: DC
  Administered 2018-11-17 – 2018-11-18 (×2): 237 mL via ORAL
  Filled 2018-11-17 (×6): qty 237

## 2018-11-17 MED ORDER — INSULIN ASPART 100 UNIT/ML ~~LOC~~ SOLN
0.0000 [IU] | Freq: Every day | SUBCUTANEOUS | Status: DC
  Administered 2018-11-17: 4 [IU] via SUBCUTANEOUS

## 2018-11-17 MED ORDER — RIVAROXABAN 15 MG PO TABS
15.0000 mg | ORAL_TABLET | Freq: Two times a day (BID) | ORAL | Status: DC
  Administered 2018-11-18: 15 mg via ORAL
  Filled 2018-11-17: qty 1

## 2018-11-17 MED ORDER — RIVAROXABAN 15 MG PO TABS
15.0000 mg | ORAL_TABLET | ORAL | Status: AC
  Administered 2018-11-17 – 2018-11-18 (×2): 15 mg via ORAL
  Filled 2018-11-17 (×2): qty 1

## 2018-11-17 MED ORDER — ORAL CARE MOUTH RINSE
15.0000 mL | Freq: Two times a day (BID) | OROMUCOSAL | Status: DC
  Administered 2018-11-17: 15 mL via OROMUCOSAL

## 2018-11-17 MED ORDER — INSULIN ASPART 100 UNIT/ML ~~LOC~~ SOLN
5.0000 [IU] | Freq: Three times a day (TID) | SUBCUTANEOUS | Status: DC
  Administered 2018-11-17 – 2018-11-18 (×3): 5 [IU] via SUBCUTANEOUS

## 2018-11-17 MED ORDER — RIVAROXABAN 20 MG PO TABS: 20.0000 mg | ORAL_TABLET | Freq: Every day | ORAL | Status: DC

## 2018-11-17 MED ORDER — INSULIN ASPART 100 UNIT/ML ~~LOC~~ SOLN
0.0000 [IU] | Freq: Three times a day (TID) | SUBCUTANEOUS | Status: DC
  Administered 2018-11-17: 8 [IU] via SUBCUTANEOUS
  Administered 2018-11-18: 2 [IU] via SUBCUTANEOUS

## 2018-11-17 MED ORDER — INSULIN GLARGINE 100 UNIT/ML ~~LOC~~ SOLN
34.0000 [IU] | Freq: Every day | SUBCUTANEOUS | Status: DC
  Administered 2018-11-17: 34 [IU] via SUBCUTANEOUS
  Filled 2018-11-17 (×3): qty 0.34

## 2018-11-17 MED ORDER — GUAIFENESIN-DM 100-10 MG/5ML PO SYRP
5.0000 mL | ORAL_SOLUTION | ORAL | Status: DC | PRN
  Administered 2018-11-17: 5 mL via ORAL
  Filled 2018-11-17: qty 10

## 2018-11-17 MED ORDER — IPRATROPIUM-ALBUTEROL 0.5-2.5 (3) MG/3ML IN SOLN
3.0000 mL | Freq: Two times a day (BID) | RESPIRATORY_TRACT | Status: DC
  Administered 2018-11-17: 3 mL via RESPIRATORY_TRACT
  Filled 2018-11-17: qty 3

## 2018-11-17 NOTE — Progress Notes (Signed)
Pt arrived from ICU via w/c. Up to chair w/ stand by assist. VSS on 4lnc. Pt oriented to callbell and environment. POC discussed. Pt w/ no c/o at this time.

## 2018-11-17 NOTE — Progress Notes (Signed)
Pt refuses CPAP QHS, RT to monitor and assess as needed.  

## 2018-11-17 NOTE — Evaluation (Signed)
Physical Therapy Evaluation Patient Details Name: Valerie Keller MRN: [id number] DOB: 26-Feb-1946 Today's Date: 11/17/2018   History of Present Illness  Pt admitted through ED 2* SOB and weakness - pt dx with CAP and COPD exacerbation and with hx of MI, DM and fibromyalgia  Clinical Impression  Pt admitted as above and presenting with functional mobility limitations 2* generalized weakness, mild ambulatory balance deficits and limited endurance (HR elevated with ambulation to 128 on 3L - SaO2 95% or higher).  Pt very motivated and should progress to dc home with family assist and could benefit from follow up HHPT to further address deficits dependent on acute stay progress.    Follow Up Recommendations Home health PT    Equipment Recommendations  None recommended by PT    Recommendations for Other Services       Precautions / Restrictions Precautions Precautions: Fall Precaution Comments: Recent fall at home      Mobility  Bed Mobility Overal bed mobility: Needs Assistance Bed Mobility: Supine to Sit     Supine to sit: Min guard        Transfers Overall transfer level: Needs assistance   Transfers: Sit to/from Stand Sit to Stand: Min guard         General transfer comment: steady assist  Ambulation/Gait Ambulation/Gait assistance: Min assist;Min guard;+2 safety/equipment Gait Distance (Feet): 230 Feet Assistive device: Rolling walker (2 wheeled) Gait Pattern/deviations: Step-to pattern;Step-through pattern;Decreased step length - right;Decreased step length - left;Shuffle;Trunk flexed     General Gait Details: cues for posture and position from RW - distance ltd by elevating HR  Stairs            Wheelchair Mobility    Modified Rankin (Stroke Patients Only)       Balance Overall balance assessment: Mild deficits observed, not formally tested                                           Pertinent Vitals/Pain Pain Assessment:  No/denies pain    Home Living Family/patient expects to be discharged to:: Private residence Living Arrangements: Spouse/significant other   Type of Home: House Home Access: Level entry     Home Layout: One level Home Equipment: Environmental consultant - 2 wheels;Cane - single point;Electric scooter;Bedside commode;Shower seat(02)      Prior Function Level of Independence: Independent         Comments: has RW and cane but reports not using prior to admit     Hand Dominance        Extremity/Trunk Assessment   Upper Extremity Assessment Upper Extremity Assessment: Generalized weakness    Lower Extremity Assessment Lower Extremity Assessment: Generalized weakness       Communication   Communication: No difficulties  Cognition Arousal/Alertness: Awake/alert Behavior During Therapy: WFL for tasks assessed/performed Overall Cognitive Status: Within Functional Limits for tasks assessed                                        General Comments      Exercises     Assessment/Plan    PT Assessment Patient needs continued PT services  PT Problem List Decreased strength;Decreased activity tolerance;Decreased balance;Decreased mobility;Decreased knowledge of use of DME;Pain;Obesity       PT Treatment Interventions DME instruction;Gait training;Stair training;Functional mobility  training;Therapeutic activities;Therapeutic exercise;Patient/family education;Balance training    PT Goals (Current goals can be found in the Care Plan section)  Acute Rehab PT Goals Patient Stated Goal: Regain IND PT Goal Formulation: With patient Time For Goal Achievement: 12/01/18 Potential to Achieve Goals: Good    Frequency Min 3X/week   Barriers to discharge        Co-evaluation PT/OT/SLP Co-Evaluation/Treatment: Yes Reason for Co-Treatment: For patient/therapist safety PT goals addressed during session: Mobility/safety with mobility OT goals addressed during session: ADL's  and self-care       AM-PAC PT "6 Clicks" Mobility  Outcome Measure Help needed turning from your back to your side while in a flat bed without using bedrails?: A Little Help needed moving from lying on your back to sitting on the side of a flat bed without using bedrails?: A Little Help needed moving to and from a bed to a chair (including a wheelchair)?: A Little Help needed standing up from a chair using your arms (e.g., wheelchair or bedside chair)?: A Little Help needed to walk in hospital room?: A Little Help needed climbing 3-5 steps with a railing? : A Lot 6 Click Score: 17    End of Session Equipment Utilized During Treatment: Gait belt;Oxygen Activity Tolerance: Patient tolerated treatment well Patient left: in chair;with call bell/phone within reach;with family/visitor present Nurse Communication: Mobility status PT Visit Diagnosis: Difficulty in walking, not elsewhere classified (R26.2);Muscle weakness (generalized) (M62.81);History of falling (Z91.81)    Time: 4536-4680 PT Time Calculation (min) (ACUTE ONLY): 29 min   Charges:   PT Evaluation $PT Eval Low Complexity: 1 Low          Valerie Keller PT Acute Rehabilitation Services Pager [phone] Office [phone]   Valerie Keller 11/17/2018, 1:59 PM

## 2018-11-17 NOTE — Progress Notes (Addendum)
ANTICOAGULATION CONSULT NOTE   Pharmacy Consult for Heparin infusion >> Xarelto Indication: pulmonary embolus  Allergies  Allergen Reactions  . Morphine Itching and Rash  . Ciprofloxacin Other (See Comments)    AMS  . Latex Hives and Other (See Comments)    Other reaction(s): Other (See Comments)  . Promethazine Other (See Comments)  . Ibuprofen   . Penicillins Hives    Did it involve swelling of the face/tongue/throat, SOB, or low BP? No Did it involve sudden or severe rash/hives, skin peeling, or any reaction on the inside of your mouth or nose? No Did you need to seek medical attention at a hospital or doctor's office? No When did it last happen? If all above answers are "NO", may proceed with cephalosporin use.    Patient Measurements: Height: 5\' 1"  (154.9 cm) Weight: 221 lb 9 oz (100.5 kg) IBW/kg (Calculated) : 47.8 Heparin Dosing Weight: 71.4  Vital Signs: Temp: 98.2 F (36.8 C) (01/29 0800) Temp Source: Oral (01/29 0800) BP: 153/81 (01/29 1000) Pulse Rate: 83 (01/29 1000)  Labs: Recent Labs    11/15/18 1423 11/15/18 1906 11/16/18 0126 11/16/18 0734 11/16/18 1348 11/16/18 2216 11/17/18 0239 11/17/18 0940  HGB 14.5  --  15.2*  --   --   --  13.3  --   HCT 44.7  --  47.0*  --   --   --  42.1  --   PLT 207  --  184  --   --   --  196  --   APTT  --   --   --   --  29  --   --   --   LABPROT  --   --   --   --  13.4  --   --   --   INR  --   --   --   --  1.03  --   --   --   HEPARINUNFRC  --   --   --   --   --  0.59  --  0.88*  CREATININE 1.04*  --  1.02*  --   --   --  1.11*  --   TROPONINI <0.03 <0.03 <0.03 <0.03  --   --   --   --    Estimated Creatinine Clearance: 49.1 mL/min (A) (by C-G formula based on SCr of 1.11 mg/dL (H)).  Medical History: Past Medical History:  Diagnosis Date  . Diabetes mellitus without complication (HCC)   . Fibromyalgia   . High blood pressure   . High cholesterol   . Lung disease   . Myocardial infarction (HCC)    . Renal disorder    Medications:  Scheduled:  . ARIPiprazole  10 mg Oral Daily  . aspirin EC  81 mg Oral Daily  . atorvastatin  40 mg Oral QHS  . DULoxetine  120 mg Oral Daily  . feeding supplement (GLUCERNA SHAKE)  237 mL Oral TID BM  . furosemide  40 mg Oral BID  . insulin aspart  0-9 Units Subcutaneous Q4H  . insulin glargine  30 Units Subcutaneous QHS  . ipratropium-albuterol  3 mL Nebulization BID  . mouth rinse  15 mL Mouth Rinse BID  . metoprolol succinate  12.5 mg Oral Daily  . predniSONE  40 mg Oral Q breakfast  . rOPINIRole  6 mg Oral QHS  . sodium chloride flush  3 mL Intravenous Q12H  . spironolactone  25 mg Oral  Daily   Infusions:  . sodium chloride    . azithromycin Stopped (11/16/18 1459)  . heparin 1,000 Units/hr (11/17/18 1009)   Assessment: 72 yoF admit 1/27 with 4-5 day hx worsening ShOB. Chest CT: non-occlusive PE, begin Heparin PMH: DM2, Fibromyalgia, CAD/Stent, CKD2, OSA, COPD/O2, GERD, hx renal stones - Discontinued SQ Lovenox - Heparin bolus 1700 units, infusion at 1000 units/hr - DDimer elevated, baseline aPTT and Protime wnl  Today, 11/17/2018  1st Hep level 1/28 at 2216 = 0.59 units/ml, mid-range therapeutic  2nd Hep level this am at 0940 = 88 units/ml, above desired range  H/H stable, wnl  Note on Azithromycin, may increase Heparin levels  Goal of Therapy:  Heparin level 0.3-0.7 units/ml Monitor CBC   Plan:   Reduce Heparin infusion to 750 units/hr  Daily CBC, daily Hep level when at steady state  Check third Hep level at 1800 today  Otho Bellows PharmD Pager [phone] 11/17/2018, 10:27 AM   1330: anti-coagulation to Xarelto  Xarelto 15mg  bid with meals x 21 days, then dose change to Xarelto 20mg  daily Discontinue Heparin with first dose Xarelto Xarelto education  Otho Bellows PharmD Pager [phone] 11/17/2018, 2:53 PM

## 2018-11-17 NOTE — Care Management Note (Signed)
Case Management Note  Patient Details  Name: DIMOND RASCON MRN: [id number] Date of Birth: 06-Jul-1946  Subjective/Objective:                  Discharge Readiness Return to top of Pulmonary Embolism RRG - ISC  Discharge readiness is indicated by patient meeting Recovery Milestones, including ALL of the following: ? Hemodynamic stability YES ? No bleeding or evidence of new emboli NONE ? Ambulatory YES ? Oxygen absent[Y] ON O2 VIA NASAL CANNULA ? INR therapeutic if indicated N/A ? Oral hydration, medications,[Z] and diet ? LEVEL OF CARE SDU   Action/Plan: Following for progression of care. Following for cm needs none present at this time.  Expected Discharge Date:  (unknown)               Expected Discharge Plan:     In-House Referral:     Discharge planning Services     Post Acute Care Choice:    Choice offered to:     DME Arranged:    DME Agency:     HH Arranged:    HH Agency:     Status of Service:     If discussed at [employer], dates discussed:    Additional Comments:  Golda Acre, RN 11/17/2018, 9:51 AM

## 2018-11-17 NOTE — Discharge Instructions (Signed)
Information on my medicine - XARELTO (rivaroxaban)  This medication education was reviewed with me or my healthcare representative as part of my discharge preparation.  The pharmacist that spoke with me during my hospital stay was:  Otho Bellows, [facility]  WHY WAS XARELTO PRESCRIBED FOR YOU? Xarelto was prescribed to treat blood clots that may have been found in the veins of your legs (deep vein thrombosis) or in your lungs (pulmonary embolism) and to reduce the risk of them occurring again.  What do you need to know about Xarelto? The starting dose is one 15 mg tablet taken TWICE daily with food for the FIRST 21 DAYS then on (enter date)  12/08/2018  the dose is changed to one 20 mg tablet taken ONCE A DAY with your evening meal.  DO NOT stop taking Xarelto without talking to the health care provider who prescribed the medication.  Refill your prescription for 20 mg tablets before you run out.  After discharge, you should have regular check-up appointments with your healthcare provider that is prescribing your Xarelto.  In the future your dose may need to be changed if your kidney function changes by a significant amount.  What do you do if you miss a dose? If you are taking Xarelto TWICE DAILY and you miss a dose, take it as soon as you remember. You may take two 15 mg tablets (total 30 mg) at the same time then resume your regularly scheduled 15 mg twice daily the next day.  If you are taking Xarelto ONCE DAILY and you miss a dose, take it as soon as you remember on the same day then continue your regularly scheduled once daily regimen the next day. Do not take two doses of Xarelto at the same time.   Important Safety Information Xarelto is a blood thinner medicine that can cause bleeding. You should call your healthcare provider right away if you experience any of the following: ? Bleeding from an injury or your nose that does not stop. ? Unusual colored urine (red or dark brown) or  unusual colored stools (red or black). ? Unusual bruising for unknown reasons. ? A serious fall or if you hit your head (even if there is no bleeding).  Some medicines may interact with Xarelto and might increase your risk of bleeding while on Xarelto. To help avoid this, consult your healthcare provider or pharmacist prior to using any new prescription or non-prescription medications, including herbals, vitamins, non-steroidal anti-inflammatory drugs (NSAIDs) and supplements.  This website has more information on Xarelto: VisitDestination.com.br.

## 2018-11-17 NOTE — Progress Notes (Signed)
Bilateral lower extremity venous duplex has been completed. Preliminary results can be found in CV Proc through chart review.   11/17/18 3:36 PM Olen Cordial RVT

## 2018-11-17 NOTE — Progress Notes (Signed)
PROGRESS NOTE    Valerie Keller  UJW:[id number] DOB: 11/20/1945 DOA: 11/15/2018 PCP: Sunnie NielsenAlexander, Natalie, DO    Brief Narrative:  73 y.o. female with a history of suspected COPD on intermittent oxygen chronically, OSA, stage III CKD, obesity, T2DM, fibromyalgia, and CAD s/p PCI who presented to the ED with several days of shortness of breath with abrupt worsening the day of presentation. She's been using oxygen more than usual and yellow sputum, having fatigue, weakness. Also noted wheezing more than usual with some improvement with nebulized therapies. On evaluation in the ED she appeared to have increased work of breathing and was [facility] for [facility]. D-dimer was noted to be elevated and subsequent CTA chest demonstrates a pulmonary embolism. Heparin is started.   Assessment & Plan:   Active Problems:   Cardiomyopathy, ischemic   Chronic kidney disease, stage 3 (moderate) (HCC)   Coronary artery disease with history of myocardial infarction without history of CABG   Essential hypertension   Hyperlipidemia, unspecified   Class 3 severe obesity due to excess calories in adult [facility](HCC)   Obstructive sleep apnea (adult) (pediatric)   Type 2 diabetes mellitus with hyperglycemia (HCC)   Acute on chronic respiratory failure with hypoxia (HCC)   COPD with acute exacerbation (HCC)   Hypercapnia  Acute on chronic hypoxic respiratory failure:  - Due to PNA and PE on baseline ?COPD, OSA and OHS.  - Continue supplemental oxygen as needed to maintain SpO2 90-95%, wean O2 as tolerated - Presently on 4LNC - Will transfer to SDU, transition to xarelto off heparin gtt  Right inferior pulmonary embolus: RV/LV wnl.  - had been on heparin gtt, weaned to xaretlo - 2d echocardiogram performed, pending result  Community acquired pneumonia ruled out: Flu negative.  - Stopped ceftriaxone, continue azithromycin. No focal infiltrate on CXR on review of CXR  - Monitor cultures  Suspected  COPD:  - Continue doxycycline, steroids, nebs (scheduled and prn) - Discussed with pulmonology who have arranged follow up for further evaluation and management as outpatient/formal pulmonary testing. Blood gases indicate some hypercarbia which is suspected to be chronic with high-normal CO2 on metabolic panel and no lethargy/encephalopathy - Wean O2 as tolerated.   CAD s/p PCI: Denies chest pain. ECG nonischemic. - Continue aspirin, statin, BB.  - Chest pain free at present  Chronic HFrEF, ICM, HTN: Modestly reduced EF on echo by report. - Monitor I/O, daily weights - Continue beta blocker, lasix BID, has been on losartan as outpatient per records, will restart if LV systolic dysfunction confirmed.   Stage III CKD:  - Cont to avoid nephrotoxins.  IDT2DM: HbA1c 7.2% indicating much improved control (Previous values in 2019 were 12%, 11.2%, 8.7%). Has seen Gavin PoundMeagan Cockfield, NP with endocrinology with [facility] in [place].  - Continued on home lantus 30u and started SSI in place of home metformin. Was prescribed humalog 7u TIDAC - Glucose suboptimally controlled. Will increase lantus dose to 34 units per diabetic coordinator recs.  Depression: Under care of psychiatry as outpatient, appears to be weaning down on medications.  - Continue home meds.   Morbid obesity: BMI 41 - Continue with diet/lifestyle modification  DVT prophylaxis: Vonzella NippleXareto Code Status: DNR Family Communication: Pt in room, family at bedside Disposition Plan: Uncertain at this time  Consultants:     Procedures:     Antimicrobials: Anti-infectives (From admission, onward)   Start     Dose/Rate Route Frequency Ordered Stop   11/16/18 1400  cefTRIAXone (ROCEPHIN)  1 g in sodium chloride 0.9 % 100 mL IVPB  Status:  Discontinued     1 g 200 mL/hr over 30 Minutes Intravenous Every 24 hours 11/15/18 1845 11/16/18 1325   11/16/18 1400  azithromycin (ZITHROMAX) 500 mg in sodium chloride 0.9 % 250 mL IVPB      500 mg 250 mL/hr over 60 Minutes Intravenous Every 24 hours 11/15/18 1856     11/15/18 1930  azithromycin (ZITHROMAX) 500 mg in sodium chloride 0.9 % 250 mL IVPB  Status:  Discontinued     500 mg 250 mL/hr over 60 Minutes Intravenous Daily-1800 11/15/18 1845 11/15/18 1856   11/15/18 1538  azithromycin (ZITHROMAX) 500 MG injection    Note to Pharmacy:  Mendel Corning   : cabinet override      11/15/18 1538 11/15/18 1550   11/15/18 1500  cefTRIAXone (ROCEPHIN) 1 g in sodium chloride 0.9 % 100 mL IVPB     1 g 200 mL/hr over 30 Minutes Intravenous  Once 11/15/18 1453 11/15/18 1542   11/15/18 1500  azithromycin (ZITHROMAX) 500 mg in sodium chloride 0.9 % 250 mL IVPB     500 mg 250 mL/hr over 60 Minutes Intravenous  Once 11/15/18 1453 11/15/18 1646       Subjective: Feeling better. Still sob, tired this AM  Objective: Vitals:   11/17/18 1100 11/17/18 1200 11/17/18 1300 11/17/18 1400  BP: (!) 151/68 (!) 160/85 (!) 168/95   Pulse: 81 84 85 90  Resp: 18 13 (!) 21 18  Temp:  98.4 F (36.9 C)    TempSrc:  Oral    SpO2: 95% 97% 98% 98%  Weight:      Height:        Intake/Output Summary (Last 24 hours) at 11/17/2018 1434 Last data filed at 11/17/2018 1211 Gross per 24 hour  Intake 868.11 ml  Output 3100 ml  Net -2231.89 ml   Filed Weights   11/15/18 2251 11/17/18 0344 11/17/18 0345  Weight: 98.7 kg 100.5 kg 100.5 kg    Examination:  General exam: Appears calm and comfortable  Respiratory system: Clear to auscultation. Respiratory effort normal. Cardiovascular system: S1 & S2 heard, RRR Gastrointestinal system: Abdomen is nondistended, soft and nontender. No organomegaly or masses felt. Normal bowel sounds heard. Central nervous system: Alert and oriented. No focal neurological deficits. Extremities: Symmetric 5 x 5 power. Skin: No rashes, lesions Psychiatry: Judgement and insight appear normal. Mood & affect appropriate.   Data Reviewed: I have personally reviewed following  labs and imaging studies  CBC: Recent Labs  Lab 11/15/18 1423 11/16/18 0126 11/17/18 0239  WBC 8.8 10.5 13.6*  NEUTROABS 5.4  --   --   HGB 14.5 15.2* 13.3  HCT 44.7 47.0* 42.1  MCV 97.0 99.8 99.1  PLT 207 184 196   Basic Metabolic Panel: Recent Labs  Lab 11/15/18 1423 11/16/18 0126 11/17/18 0239  NA 140 140 139  K 4.1 4.6 4.2  CL 103 105 101  CO2 31 29 29   GLUCOSE 158* 158* 279*  BUN 15 15 27*  CREATININE 1.04* 1.02* 1.11*  CALCIUM 9.9 9.3 9.1  MG  --  2.2  --   PHOS  --  3.6  --    GFR: Estimated Creatinine Clearance: 49.1 mL/min (A) (by C-G formula based on SCr of 1.11 mg/dL (H)). Liver Function Tests: Recent Labs  Lab 11/16/18 0126  AST 22  ALT 27  ALKPHOS 65  BILITOT 0.5  PROT 7.1  ALBUMIN 3.6  No results for input(s): LIPASE, AMYLASE in the last 168 hours. No results for input(s): AMMONIA in the last 168 hours. Coagulation Profile: Recent Labs  Lab 11/16/18 1348  INR 1.03   Cardiac Enzymes: Recent Labs  Lab 11/15/18 1423 11/15/18 1906 11/16/18 0126 11/16/18 0734  TROPONINI <0.03 <0.03 <0.03 <0.03   BNP (last 3 results) No results for input(s): PROBNP in the last 8760 hours. HbA1C: Recent Labs    11/15/18 1906  HGBA1C 7.2*   CBG: Recent Labs  Lab 11/16/18 1941 11/16/18 2336 11/17/18 0342 11/17/18 0806 11/17/18 1218  GLUCAP 299* 297* 238* 266* 321*   Lipid Profile: No results for input(s): CHOL, HDL, LDLCALC, TRIG, CHOLHDL, LDLDIRECT in the last 72 hours. Thyroid Function Tests: Recent Labs    11/16/18 0126  TSH 1.128   Anemia Panel: No results for input(s): VITAMINB12, FOLATE, FERRITIN, TIBC, IRON, RETICCTPCT in the last 72 hours. Sepsis Labs: No results for input(s): PROCALCITON, LATICACIDVEN in the last 168 hours.  Recent Results (from the past 240 hour(s))  Culture, sputum-assessment     Status: None   Collection Time: 11/15/18  6:44 PM  Result Value Ref Range Status   Specimen Description EXPECTORATED SPUTUM   Final   Special Requests Normal  Final   Sputum evaluation   Final    THIS SPECIMEN IS ACCEPTABLE FOR SPUTUM CULTURE Performed at [facility], [address] [place], [place] [address]    Report Status 11/15/2018 FINAL  Final  Culture, respiratory     Status: None (Preliminary result)   Collection Time: 11/15/18  6:44 PM  Result Value Ref Range Status   Specimen Description   Final    EXPECTORATED SPUTUM Performed at [facility], [address], [place], [place] [address]    Special Requests   Final    Normal Reflexed from [id number] Performed at [facility], [address], [place], [place] [address]    Gram Stain   Final    ABUNDANT WBC PRESENT, PREDOMINANTLY PMN MODERATE GRAM POSITIVE RODS FEW GRAM POSITIVE COCCI RARE YEAST WITH PSEUDOHYPHAE    Culture   Final    CULTURE REINCUBATED FOR BETTER GROWTH Performed at [facility], [address], [place], [place] [address]    Report Status PENDING  Incomplete  Culture, blood (routine x 2) Call MD if unable to obtain prior to antibiotics being given     Status: None (Preliminary result)   Collection Time: 11/15/18  7:06 PM  Result Value Ref Range Status   Specimen Description   Final    BLOOD RIGHT HAND Performed at [facility], [address], [place], [place] [address]    Special Requests   Final    BOTTLES DRAWN AEROBIC AND ANAEROBIC Blood Culture adequate volume Performed at [facility], [address] [place], [place] [address]    Culture   Final    NO GROWTH 2 DAYS Performed at [facility], [address] [place], [place] [address]    Report Status PENDING  Incomplete  Culture, blood (routine x 2) Call MD if unable to obtain prior to antibiotics being given     Status: None (Preliminary result)   Collection Time: 11/15/18  7:15 PM  Result Value Ref Range Status   Specimen Description   Final    BLOOD  RIGHT ANTECUBITAL Performed at [facility], [address] [place], [place] [address]    Special  Requests   Final    BOTTLES DRAWN AEROBIC AND ANAEROBIC Blood Culture adequate volume Performed at [facility], [address], [place], [place] [address]    Culture   Final    NO GROWTH 2 DAYS Performed at [facility], [address], [place], [place] [address]    Report Status PENDING  Incomplete  MRSA PCR Screening     Status: None   Collection Time: 11/16/18 12:58 AM  Result Value Ref Range Status   MRSA by PCR NEGATIVE NEGATIVE Final    Comment:        The GeneXpert MRSA Assay (FDA approved for NASAL specimens only), is one component of a comprehensive MRSA colonization surveillance program. It is not intended to diagnose MRSA infection nor to guide or monitor treatment for MRSA infections. Performed at [facility], [address], [place], [place] [address]   Urine Culture     Status: Abnormal   Collection Time: 11/16/18  6:08 AM  Result Value Ref Range Status   Specimen Description   Final    URINE, CLEAN CATCH Performed at [facility], [address] [place], [place] [address]    Special Requests   Final    NONE Performed at [facility], [address], [place], [place] [address]    Culture (A)  Final    <10,000 COLONIES/mL INSIGNIFICANT GROWTH Performed at [facility], [address], [place], [place] [address]    Report Status 11/17/2018 FINAL  Final     Radiology Studies: Ct Angio Chest Pe W Or Wo Contrast  Addendum Date: 11/16/2018   ADDENDUM REPORT: 11/16/2018 12:22 ADDENDUM: Critical Value/emergent results were called by telephone at the time of interpretation on 11/16/2018 at 12:22 pm to Dr. Jarvis Newcomer, who verbally acknowledged these results. Electronically Signed   By: Lupita Raider, M.D.   On: 11/16/2018 12:22   Result Date:  11/16/2018 CLINICAL DATA:  Shortness of breath. EXAM: CT ANGIOGRAPHY CHEST WITH CONTRAST TECHNIQUE: Multidetector CT imaging of the chest was performed using the standard protocol during bolus administration of intravenous contrast. Multiplanar CT image reconstructions and MIPs were obtained to evaluate the vascular anatomy. CONTRAST:  ISOVUE-370 IOPAMIDOL (ISOVUE-370) INJECTION 76% COMPARISON:  Radiographs of November 15, 2018. FINDINGS: Cardiovascular: Small linear filling defect is noted in lower lobe branch of right pulmonary artery concerning for nonocclusive pulmonary embolus. RV/LV ratio of 0.6 is noted which is within normal limits. Atherosclerosis of thoracic aorta is noted without aneurysm or dissection. Coronary artery calcifications are noted. No pericardial effusion is noted. Mediastinum/Nodes: No enlarged mediastinal, hilar, or axillary lymph nodes. Thyroid gland, trachea, and esophagus demonstrate no significant findings. Lungs/Pleura: No pneumothorax is noted. Mild bilateral pleural effusions are noted with adjacent subsegmental atelectasis of both lower lobes. Upper Abdomen: No acute abnormality. Musculoskeletal: Old lower thoracic vertebral body fracture is noted. No acute osseous abnormality is noted. Review of the MIP images confirms the above findings. IMPRESSION: Small linear filling defect seen in lower lobe branch of right pulmonary artery consistent with nonocclusive pulmonary embolus. Coronary artery calcifications are noted suggesting coronary artery disease. Mild bilateral pleural effusions are noted with adjacent subsegmental atelectasis of both lower lobes. Old lower thoracic vertebral body fracture is noted. Aortic Atherosclerosis (ICD10-I70.0). Electronically Signed: By: Lupita Raider, M.D. On: 11/16/2018 12:10    Scheduled Meds: . ARIPiprazole  10 mg Oral Daily  . aspirin EC  81 mg Oral Daily  .  atorvastatin  40 mg Oral QHS  . DULoxetine  120 mg Oral Daily  . feeding  supplement (GLUCERNA SHAKE)  237 mL Oral TID BM  . furosemide  40 mg Oral BID  . insulin aspart  0-15 Units Subcutaneous TID WC  . insulin aspart  0-5 Units Subcutaneous QHS  . insulin aspart  5 Units Subcutaneous TID WC  . insulin glargine  30 Units Subcutaneous QHS  . ipratropium-albuterol  3 mL Nebulization BID  . mouth rinse  15 mL Mouth Rinse BID  . metoprolol succinate  12.5 mg Oral Daily  . predniSONE  40 mg Oral Q breakfast  . rivaroxaban  15 mg Oral 2 times per day on Wed  . [START ON 11/18/2018] rivaroxaban  15 mg Oral BID WC   Followed by  . [START ON 12/08/2018] rivaroxaban  20 mg Oral Q supper  . rOPINIRole  6 mg Oral QHS  . sodium chloride flush  3 mL Intravenous Q12H  . spironolactone  25 mg Oral Daily   Continuous Infusions: . sodium chloride    . azithromycin Stopped (11/17/18 1405)     LOS: 2 days   Rickey BarbaraStephen Chiu, MD [facility] Pager On Amion  If 7PM-7AM, please contact night-coverage 11/17/2018, 2:34 PM

## 2018-11-17 NOTE — Progress Notes (Signed)
ANTICOAGULATION CONSULT NOTE - Follow Up Consult  Pharmacy Consult for Heparin Indication: pulmonary embolus  Allergies  Allergen Reactions  . Morphine Itching and Rash  . Ciprofloxacin Other (See Comments)    AMS  . Latex Hives and Other (See Comments)    Other reaction(s): Other (See Comments)  . Promethazine Other (See Comments)  . Ibuprofen   . Penicillins Hives    Did it involve swelling of the face/tongue/throat, SOB, or low BP? No Did it involve sudden or severe rash/hives, skin peeling, or any reaction on the inside of your mouth or nose? No Did you need to seek medical attention at a hospital or doctor's office? No When did it last happen? If all above answers are "NO", may proceed with cephalosporin use.     Patient Measurements: Height: 5\' 1"  (154.9 cm) Weight: 221 lb 9 oz (100.5 kg) IBW/kg (Calculated) : 47.8 Heparin Dosing Weight:   Vital Signs: Temp: 98.2 F (36.8 C) (01/28 2337) Temp Source: Oral (01/28 2337) BP: 134/55 (01/29 0200) Pulse Rate: 85 (01/29 0200)  Labs: Recent Labs    11/15/18 1423 11/15/18 1906 11/16/18 0126 11/16/18 0734 11/16/18 1348 11/16/18 2216 11/17/18 0239  HGB 14.5  --  15.2*  --   --   --  13.3  HCT 44.7  --  47.0*  --   --   --  42.1  PLT 207  --  184  --   --   --  196  APTT  --   --   --   --  29  --   --   LABPROT  --   --   --   --  13.4  --   --   INR  --   --   --   --  1.03  --   --   HEPARINUNFRC  --   --   --   --   --  0.59  --   CREATININE 1.04*  --  1.02*  --   --   --  1.11*  TROPONINI <0.03 <0.03 <0.03 <0.03  --   --   --     Estimated Creatinine Clearance: 49.1 mL/min (A) (by C-G formula based on SCr of 1.11 mg/dL (H)).   Medications:  Infusions:  . sodium chloride    . azithromycin Stopped (11/16/18 1459)  . heparin 1,000 Units/hr (11/17/18 0200)    Assessment: Patient with heparin level at goal.  No heparin issues noted.  Goal of Therapy:  Heparin level 0.3-0.7 units/ml Monitor platelets  by anticoagulation protocol: Yes   Plan:  Continue heparin drip at current rate Recheck level at 0900  Darlina Guys, Jacquenette Shone Crowford 11/17/2018,5:50 AM

## 2018-11-17 NOTE — Progress Notes (Signed)
Inpatient Diabetes Program Recommendations  AACE/ADA: New Consensus Statement on Inpatient Glycemic Control (2015)  Target Ranges:  Prepandial:   less than 140 mg/dL      Peak postprandial:   less than 180 mg/dL (1-2 hours)      Critically ill patients:  140 - 180 mg/dL   Lab Results  Component Value Date   GLUCAP 266 (H) 11/17/2018   HGBA1C 7.2 (H) 11/15/2018    Review of Glycemic Control  Diabetes history: DM2 Outpatient Diabetes medications: Lantus 30 units QHS, Humalog 7 units tidwc, metformin 425 mg bid Current orders for Inpatient glycemic control: Lantus 30 units QHS, Novolog 0-9 units Q4H On Prednisone 40 mg QAM  Inpatient Diabetes Program Recommendations:     Increase Lantus to 34 units QHS IncreaseNovolog to 0-15 units tidwc and hs Change diet to CHO mod med  Will continue to follow.  Thank you. Ailene Ards, RD, LDN, CDE Inpatient Diabetes Coordinator [phone]

## 2018-11-17 NOTE — Evaluation (Signed)
Occupational Therapy Evaluation Patient Details Name: Valerie Keller MRN: [id number] DOB: 01/29/1946 Today's Date: 11/17/2018    History of Present Illness Pt admitted through ED 2* SOB and weakness - pt dx with CAP and COPD exacerbation and with hx of MI, DM and fibromyalgia   Clinical Impression   This 73 year old female was admitted for the above.  At baseline, she is independent with adls; she has AD's but does not use them.  Pt currently needs min guard for mobility and min A for LB adls. Will follow in acute setting with supervision level goals and reinforce energy conservation education    Follow Up Recommendations  Supervision/Assistance - 24 hour    Equipment Recommendations  None recommended by OT    Recommendations for Other Services       Precautions / Restrictions Precautions Precautions: Fall Precaution Comments: Recent fall at home Restrictions Weight Bearing Restrictions: No      Mobility Bed Mobility Overal bed mobility: Needs Assistance Bed Mobility: Supine to Sit     Supine to sit: Min guard        Transfers Overall transfer level: Needs assistance   Transfers: Sit to/from Stand Sit to Stand: Min guard         General transfer comment: steady assist    Balance Overall balance assessment: Mild deficits observed, not formally tested                                         ADL either performed or assessed with clinical judgement   ADL Overall ADL's : Needs assistance/impaired             Lower Body Bathing: Minimal assistance;Sit to/from stand       Lower Body Dressing: Minimal assistance;Sit to/from stand   Toilet Transfer: Min guard;Ambulation;Comfort height toilet;RW;Grab bars;+2 for safety/equipment   Toileting- Clothing Manipulation and Hygiene: Sitting/lateral lean;Supervision/safety         General ADL Comments: pt can perform UB adls with set up. She ambulated in hall then into bathroom, +2 safety  for lines/02.  No LOB. Started energy conservation education. Pt states this will be difficult for her as she doesn't like a speck of dust around     Vision         Perception     Praxis      Pertinent Vitals/Pain Pain Assessment: No/denies pain     Hand Dominance     Extremity/Trunk Assessment Upper Extremity Assessment Upper Extremity Assessment: Generalized weakness   Lower Extremity Assessment Lower Extremity Assessment: Generalized weakness       Communication Communication Communication: No difficulties   Cognition Arousal/Alertness: Awake/alert Behavior During Therapy: WFL for tasks assessed/performed Overall Cognitive Status: Within Functional Limits for tasks assessed                                     General Comments  HR up to 128.  Sats in 90s on 3 liters    Exercises     Shoulder Instructions      Home Living Family/patient expects to be discharged to:: Private residence Living Arrangements: Spouse/significant other   Type of Home: Apartment Home Access: Level entry     Home Layout: One level     Bathroom Shower/Tub: Chief Strategy OfficerTub/shower unit   Bathroom Toilet: Standard  Home Equipment: Walker - 2 wheels;Cane - single point;Electric scooter;Bedside commode;Shower seat(02)   Additional Comments: uses 4 liters 02 prn      Prior Functioning/Environment Level of Independence: Independent        Comments: has RW and cane but reports not using prior to admit        OT Problem List: Decreased strength;Decreased knowledge of use of DME or AE      OT Treatment/Interventions: Self-care/ADL training;Energy conservation;DME and/or AE instruction;Therapeutic activities;Balance training;Patient/family education    OT Goals(Current goals can be found in the care plan section) Acute Rehab OT Goals Patient Stated Goal: Regain IND OT Goal Formulation: With patient Time For Goal Achievement: 12/01/18 ADL Goals Pt Will Transfer to  Toilet: with supervision;ambulating;bedside commode Pt Will Perform Tub/Shower Transfer: Tub transfer;with supervision(with seat, if needed) Additional ADL Goal #1: pt will complete adl with supervision and set up Additional ADL Goal #2: pt will initiate at least one rest break without cues and verbalize 3 energy conservation strategies  OT Frequency: Min 2X/week   Barriers to D/C:            Co-evaluation   Reason for Co-Treatment: For patient/therapist safety PT goals addressed during session: Mobility/safety with mobility OT goals addressed during session: ADL's and self-care      AM-PAC OT "6 Clicks" Daily Activity     Outcome Measure Help from another person eating meals?: None Help from another person taking care of personal grooming?: A Little Help from another person toileting, which includes using toliet, bedpan, or urinal?: A Little Help from another person bathing (including washing, rinsing, drying)?: A Little Help from another person to put on and taking off regular upper body clothing?: A Little Help from another person to put on and taking off regular lower body clothing?: A Little 6 Click Score: 19   End of Session    Activity Tolerance: Patient tolerated treatment well Patient left: in chair;with call bell/phone within reach;with family/visitor present  OT Visit Diagnosis: Muscle weakness (generalized) (M62.81)                Time: 7494-4967 OT Time Calculation (min): 29 min Charges:  OT General Charges $OT Visit: 1 Visit OT Evaluation $OT Eval Low Complexity: 1 Low  Valerie Keller, Valerie Keller Acute Rehabilitation Services [phone] [facility] pager [phone] office 11/17/2018  Calie Buttrey 11/17/2018, 2:38 PM

## 2018-11-18 ENCOUNTER — Inpatient Hospital Stay (HOSPITAL_COMMUNITY): Payer: Medicare Other

## 2018-11-18 DIAGNOSIS — I255 Ischemic cardiomyopathy: Secondary | ICD-10-CM

## 2018-11-18 LAB — CBC
HEMATOCRIT: 45.8 % (ref 36.0–46.0)
Hemoglobin: 15.2 g/dL — ABNORMAL HIGH (ref 12.0–15.0)
MCH: 31.6 pg (ref 26.0–34.0)
MCHC: 33.2 g/dL (ref 30.0–36.0)
MCV: 95.2 fL (ref 80.0–100.0)
Platelets: 187 10*3/uL (ref 150–400)
RBC: 4.81 MIL/uL (ref 3.87–5.11)
RDW: 13.6 % (ref 11.5–15.5)
WBC: 14.7 10*3/uL — ABNORMAL HIGH (ref 4.0–10.5)
nRBC: 0 % (ref 0.0–0.2)

## 2018-11-18 LAB — CULTURE, RESPIRATORY W GRAM STAIN: Special Requests: NORMAL

## 2018-11-18 LAB — GLUCOSE, CAPILLARY
Glucose-Capillary: 120 mg/dL — ABNORMAL HIGH (ref 70–99)
Glucose-Capillary: 137 mg/dL — ABNORMAL HIGH (ref 70–99)

## 2018-11-18 LAB — CULTURE, RESPIRATORY: CULTURE: NORMAL

## 2018-11-18 LAB — ECHOCARDIOGRAM COMPLETE
Height: 61 in
Weight: 3416 oz

## 2018-11-18 MED ORDER — AZITHROMYCIN 250 MG PO TABS
500.0000 mg | ORAL_TABLET | Freq: Every day | ORAL | Status: DC
  Administered 2018-11-18: 500 mg via ORAL
  Filled 2018-11-18: qty 2

## 2018-11-18 MED ORDER — RIVAROXABAN 20 MG PO TABS: 20.0000 mg | ORAL_TABLET | Freq: Every day | ORAL | 0 refills | Status: DC

## 2018-11-18 MED ORDER — METOPROLOL SUCCINATE ER 25 MG PO TB24: 12.5000 mg | ORAL_TABLET | Freq: Every day | ORAL | 0 refills | Status: DC

## 2018-11-18 MED ORDER — PREDNISONE 10 MG PO TABS: 10.0000 mg | ORAL_TABLET | Freq: Every day | ORAL | 0 refills | Status: DC

## 2018-11-18 MED ORDER — LANTUS SOLOSTAR 100 UNIT/ML ~~LOC~~ SOPN: 34.0000 [IU] | PEN_INJECTOR | Freq: Every day | SUBCUTANEOUS | 0 refills | Status: DC

## 2018-11-18 MED ORDER — AZITHROMYCIN 250 MG PO TABS: ORAL_TABLET | ORAL | 0 refills | Status: DC

## 2018-11-18 MED ORDER — PERFLUTREN LIPID MICROSPHERE
1.0000 mL | INTRAVENOUS | Status: AC | PRN
  Administered 2018-11-18: 3 mL via INTRAVENOUS
  Filled 2018-11-18: qty 10

## 2018-11-18 MED ORDER — RIVAROXABAN 15 MG PO TABS: 15.0000 mg | ORAL_TABLET | Freq: Two times a day (BID) | ORAL | 0 refills | Status: DC

## 2018-11-18 MED ORDER — INSULIN ASPART 100 UNIT/ML FLEXPEN: 5.0000 [IU] | PEN_INJECTOR | Freq: Three times a day (TID) | SUBCUTANEOUS | 0 refills | Status: DC

## 2018-11-18 NOTE — Care Management Important Message (Signed)
Important Message  Patient Details  Name: Valerie Keller MRN: [id number] Date of Birth: 05-09-1946   Medicare Important Message Given:  Yes    Caren Macadam 11/18/2018, 10:44 AMImportant Message  Patient Details  Name: Valerie Keller MRN: [id number] Date of Birth: [birth year]   Medicare Important Message Given:  Yes    Caren Macadam 11/18/2018, 10:44 AM

## 2018-11-18 NOTE — Progress Notes (Signed)
PT Cancellation Note  Patient Details Name: Valerie Keller MRN: [id number] DOB: 04-05-1946   Cancelled Treatment:    Reason Eval/Treat Not Completed: Attempted PT tx session-pt declined participation on today-c/o headache and dyspnea. Will check back another day.    Rebeca Alert, PT Acute Rehabilitation Services Pager: [phone] Office: [phone]

## 2018-11-18 NOTE — Care Management Note (Signed)
Case Management Note  Patient Details  Name: Valetta Moleeggy L Sonnier MRN: [id number] Date of Birth: 01/09/1946  Subjective/Objective: [facility] unable to accept patient d/t staffing. Encomapss [facility] rep Cassie able to accept-patient in agreement. No further CM needs.                   Action/Plan:d/c home w/HHC.   Expected Discharge Date:  (unknown)               Expected Discharge Plan:  Home w Home Health Services  In-House Referral:     Discharge planning Services  CM Consult  Post Acute Care Choice:  Durable Medical Equipment([employer]-home 02,has travel tank) Choice offered to:  Patient  DME Arranged:    DME Agency:     HH Arranged:  PT HH Agency:  [facility], [facility]  Status of Service:  Completed, signed off  If discussed at Long Length of Stay Meetings, dates discussed:    Additional Comments:  Lanier ClamMahabir, Loni Delbridge, RN 11/18/2018, 3:05 PM

## 2018-11-18 NOTE — Progress Notes (Signed)
SATURATION QUALIFICATIONS: (This note is used to comply with regulatory documentation for home oxygen)  Patient Saturations on 4L [place](patient's home baseline) at Rest = 95%  Patient Saturations on 4L [place] while Ambulating = 95-100%  Patient's oxygen saturation stayed WNL with baesline o2

## 2018-11-18 NOTE — Discharge Summary (Signed)
Physician Discharge Summary  Valerie Keller ZOX:[id number] DOB: 1946-03-26 DOA: 11/15/2018  PCP: Sunnie Nielsen, DO  Admit date: 11/15/2018 Discharge date: 11/18/2018  Admitted From: Home Disposition:  Home  Recommendations for Outpatient Follow-up:  1. Follow up with PCP in 1-2 weeks  Home Health:PT   Discharge Condition:Improved CODE STATUS:DNR Diet recommendation: Diabetic   Brief/Interim Summary: 73 y.o.femalewith a history of suspected COPD on intermittent oxygen chronically, OSA, stage III CKD, obesity, T2DM, fibromyalgia, and CAD s/p PCI who presented to the ED with several days of shortness of breath with abrupt worsening the day of presentation. She's been using oxygen more than usual and yellow sputum, having fatigue, weakness. Also noted wheezing more than usual with some improvement with nebulized therapies.On evaluation in the ED she appeared to have increased work of breathing and was admitted for pneumonia to SDU at [facility]. D-dimer was noted to be elevated and subsequent CTA chest demonstrates a pulmonary embolism. Heparin is started.  Discharge Diagnoses:  Active Problems:   Cardiomyopathy, ischemic   Chronic kidney disease, stage 3 (moderate) (HCC)   Coronary artery disease with history of myocardial infarction without history of CABG   Essential hypertension   Hyperlipidemia, unspecified   Class 3 severe obesity due to excess calories in adult [facility])   Obstructive sleep apnea (adult) (pediatric)   Type 2 diabetes mellitus with hyperglycemia (HCC)   Acute on chronic respiratory failure with hypoxia (HCC)   COPD with acute exacerbation (HCC)   Hypercapnia  Acute on chronic hypoxic respiratory failure:  - Due to PNA and PE on baseline ?COPD, OSA and OHS.  - Continue supplemental oxygen as needed to maintain SpO2 90-95%, wean O2 as tolerated - Presently on 4LNC, baseline O2 per patient - Transitioned to xarelto off heparin gtt  Right inferior pulmonary embolus:  RV/LV wnl.  - had been on heparin gtt, weaned to xaretlo - 2d echocardiogram performed, normal RV systolic function  Community acquired pneumonia ruled out: Flu negative.  - Stopped ceftriaxone, continued azithromycin. No focal infiltrate on CXR on review of CXR  - Complete course of azithromycin on discharge  Suspected COPD:  - Continue doxycycline, steroids, nebs (scheduled and prn) - Discussed with pulmonology who have arranged follow up for further evaluation and management as outpatient/formal pulmonary testing. Blood gases indicate some hypercarbia which is suspected to be chronic with high-normal CO2 on metabolic panel and no lethargy/encephalopathy - Complete course of prednisone taper on discharge  CAD s/p PCI: Denies chest pain. ECG nonischemic. - Continue aspirin, statin, BB.  - Chest pain free at present  Chronic HFrEF, ICM, HTN: Modestly reduced EF on echo by report. - Monitor I/O, daily weights - Continue beta blocker, lasix BID  Stage III CKD:  - Cont to avoid nephrotoxins.  IDT2DM: HbA1c 7.2% indicating much improved control (Previous values in 2019 were 12%, 11.2%, 8.7%). Has seen Gavin Pound, NP with endocrinology with [facility] in [place].  - Continued on home lantus 30u and started SSI in place of home metformin. Was prescribed humalog 7u TIDAC previously - Glucose suboptimally controlled. Increased lantus dose to 34 units per diabetic coordinator recs.   Depression: Under care of psychiatry as outpatient, appears to be weaning down on medications.  - Continue home meds.   Morbid obesity: BMI 41 - Continue with diet/lifestyle modification   Discharge Instructions   Allergies as of 11/18/2018      Reactions   Morphine Itching, Rash   Ciprofloxacin Other (See Comments)   AMS  Latex Hives, Other (See Comments)   Other reaction(s): Other (See Comments)   Promethazine Other (See Comments)   Ibuprofen    Penicillins Hives   Did it involve  swelling of the face/tongue/throat, SOB, or low BP? No Did it involve sudden or severe rash/hives, skin peeling, or any reaction on the inside of your mouth or nose? No Did you need to seek medical attention at a hospital or doctor's office? No When did it last happen? If all above answers are "NO", may proceed with cephalosporin use.      Medication List    STOP taking these medications   carvedilol 6.25 MG tablet Commonly known as:  COREG     TAKE these medications   albuterol (2.5 MG/3ML) 0.083% nebulizer solution Commonly known as:  PROVENTIL Take 3 mLs by nebulization every 4 (four) hours as needed for shortness of breath.   Alpha-Lipoic Acid 300 MG Caps Take 1 capsule by mouth daily.   ARIPiprazole 10 MG tablet Commonly known as:  ABILIFY Take 10 mg by mouth daily.   aspirin 81 MG tablet Take 1 tablet (81 mg total) by mouth daily.   atorvastatin 40 MG tablet Commonly known as:  LIPITOR Take 1 tablet (40 mg total) by mouth at bedtime.   azithromycin 250 MG tablet Commonly known as:  ZITHROMAX 1 tab PO daily x 2 more days then stop. Zero refills Start taking on:  November 19, 2018   clonazepam 2 MG disintegrating tablet Commonly known as:  KLONOPIN Take 2 mg by mouth 3 (three) times daily as needed (anxiety).   DULoxetine 60 MG capsule Commonly known as:  CYMBALTA Take 2 capsules by mouth daily.   FLONASE ALLERGY RELIEF 50 MCG/ACT nasal spray Generic drug:  fluticasone Place 2 sprays into the nose daily as needed for allergies.   furosemide 40 MG tablet Commonly known as:  LASIX Take 2 tablets (80 mg total) by mouth daily. What changed:    how much to take  when to take this   HUMALOG KWIKPEN 100 UNIT/ML KwikPen Generic drug:  insulin lispro Inject 7 Units into the skin 3 (three) times daily before meals.   Ipratropium-Albuterol 20-100 MCG/ACT Aers respimat Commonly known as:  COMBIVENT RESPIMAT Inhale 1 puff into the lungs every 6 (six) hours as  needed for wheezing.   LANTUS SOLOSTAR 100 UNIT/ML Solostar Pen Generic drug:  Insulin Glargine Inject 34 Units into the skin at bedtime. What changed:  how much to take   Magnesium Oxide 400 (240 Mg) MG Tabs Take 1-2 tablets by mouth 2 (two) times daily. 1 tablet in the AM and 2 tablets in the evening   metFORMIN 850 MG tablet Commonly known as:  GLUCOPHAGE Take 0.5 tablets (425 mg total) by mouth 2 (two) times daily.   metoprolol succinate 25 MG 24 hr tablet Commonly known as:  TOPROL-XL Take 0.5 tablets (12.5 mg total) by mouth daily for 30 days. Start taking on:  November 19, 2018   multivitamin capsule Take 1 capsule by mouth daily.   nitroGLYCERIN 0.4 MG SL tablet Commonly known as:  NITROSTAT Place 1 tablet (0.4 mg total) under the tongue every 5 (five) minutes as needed for chest pain.   Pen Needles 31G X 8 MM Misc For use with insulin pen qid as directed, please dispense per patient preference and insurance coverage   potassium chloride SA 20 MEQ tablet Commonly known as:  K-DUR,KLOR-CON Take 20 mEq by mouth daily.   predniSONE 10  MG tablet Commonly known as:  DELTASONE Take 1 tablet (10 mg total) by mouth daily. Taper dose: 40mg  po daily x 3 days, then 20mg  po daily x 3 days, then 10mg  po daily x 3 days, then 5mg  po daily x 3 days, then stop, zero refills   Rivaroxaban 15 MG Tabs tablet Commonly known as:  XARELTO Take 1 tablet (15 mg total) by mouth 2 (two) times daily with a meal for 19 days.   rivaroxaban 20 MG Tabs tablet Commonly known as:  XARELTO Take 1 tablet (20 mg total) by mouth daily with supper for 30 days. Start taking on:  December 08, 2018   rOPINIRole 2 MG tablet Commonly known as:  REQUIP Take 3 tablets by mouth at bedtime.   spironolactone 25 MG tablet Commonly known as:  ALDACTONE Take 1 tablet (25 mg total) by mouth daily.   Vitamin D (Ergocalciferol) 1.25 MG (50000 UT) Caps capsule Commonly known as:  DRISDOL Take 1 capsule  (50,000 Units total) by mouth every 14 (fourteen) days.      Follow-up Information    Icard, Elige Radon L, DO Follow up on 11/30/2018.   Specialty:  Pulmonary Disease Why:  145pm.. arrive at 130 for check-in  Contact information: [address] [place] [address] [phone]        Sunnie Nielsen, DO. Schedule an appointment as soon as possible for a visit in 1 week(s).   Specialty:  Osteopathic Medicine Contact information: [address] [place] [address] [phone]          Allergies  Allergen Reactions  . Morphine Itching and Rash  . Ciprofloxacin Other (See Comments)    AMS  . Latex Hives and Other (See Comments)    Other reaction(s): Other (See Comments)  . Promethazine Other (See Comments)  . Ibuprofen   . Penicillins Hives    Did it involve swelling of the face/tongue/throat, SOB, or low BP? No Did it involve sudden or severe rash/hives, skin peeling, or any reaction on the inside of your mouth or nose? No Did you need to seek medical attention at a hospital or doctor's office? No When did it last happen? If all above answers are "NO", may proceed with cephalosporin use.      Procedures/Studies: Dg Nasal Bones  Result Date: 11/10/2018 CLINICAL DATA:  Fall, facial pain. EXAM: NASAL BONES - 3+ VIEW COMPARISON:  None. FINDINGS: There is difficulty with positioning the patient. Particularly the right lateral views are oblique. However, no discrete fracture is identified. IMPRESSION: 1. No nasal fracture identified. Adversely affected negative predictive value due to the obliquity of the lateral view attempts-patient had difficulty cooperating with positioning. Electronically Signed   By: Gaylyn Rong M.D.   On: 11/10/2018 16:39   Dg Chest 2 View  Result Date: 11/15/2018 CLINICAL DATA:  Chest pain and cough for 6 months. EXAM: CHEST - 2 VIEW COMPARISON:  02/01/18. FINDINGS: Normal heart size. There are patchy airspace  densities and subsegmental atelectasis in the left midlung and left base. Increased opacity along the right paratracheal stripe with a suggestion of right hilar adenopathy. Compression fracture involving the T9 vertebra is again noted with loss of greater than 50% of the vertebral body height. IMPRESSION: 1. Patchy opacities within the left midlung and left base which may represent pneumonia and or atelectasis. 2. Increased opacity along the right paratracheal stripe and right hilar region. Can not rule out adenopathy. Consider follow-up imaging with nonemergent  contrast enhanced CT of the chest. Electronically Signed   By: Signa Kell M.D.   On: 11/15/2018 13:53   Dg Cervical Spine Complete  Result Date: 11/10/2018 CLINICAL DATA:  73 y/o  F; fall with back and neck pain. EXAM: THORACIC SPINE - 3 VIEWS; CERVICAL SPINE - COMPLETE 4+ VIEW; LUMBAR SPINE - COMPLETE 4+ VIEW COMPARISON:  02/01/2018 CT of head, cervical spine, thoracic spine, lumbar spine. FINDINGS: Cervical spine: C1-C5 visible on the lateral view. Normal cervical lordosis without listhesis. Odontoid process is intact on the open-mouth view. Normal anterior C1-2 articulation. Prevertebral soft tissue thickening is likely due to retropharyngeal carotid arteries as seen on prior CT of cervical spine. No significant loss of vertebral body or disc space height. Small anterior endplate marginal osteophytes at multiple levels. Thoracic spine: Twelve paired thoracic ribs. Normal thoracic kyphosis without listhesis. Stable mild T3, mild T4, and moderate T10 chronic compression deformities discogenic degenerative changes with loss of intervertebral disc space height and endplate marginal osteophytes of the lower thoracic spine. Normal cervicothoracic junction alignment on the swimmer's view. Lumbar spine: Five complete lumbar type non-rib-bearing vertebral bodies. Normal lumbar lordosis without listhesis. Stable chronic mild T12 and moderate L4 compression  deformities no acute fracture identified. Mild loss of intervertebral disc space height at L3-4 and L5-S1. Prominent lower lumbar facet arthrosis. Surgical clips project over the right hemipelvis and right upper quadrant. IMPRESSION: 1. No acute fracture or dislocation identified in the cervical, thoracic, or lumbar spine. 2. Stable chronic T3, T4, T10, T12, and L4 compression deformities. Electronically Signed   By: Mitzi Hansen M.D.   On: 11/10/2018 16:45   Dg Thoracic Spine W/swimmers  Result Date: 11/10/2018 CLINICAL DATA:  73 y/o  F; fall with back and neck pain. EXAM: THORACIC SPINE - 3 VIEWS; CERVICAL SPINE - COMPLETE 4+ VIEW; LUMBAR SPINE - COMPLETE 4+ VIEW COMPARISON:  02/01/2018 CT of head, cervical spine, thoracic spine, lumbar spine. FINDINGS: Cervical spine: C1-C5 visible on the lateral view. Normal cervical lordosis without listhesis. Odontoid process is intact on the open-mouth view. Normal anterior C1-2 articulation. Prevertebral soft tissue thickening is likely due to retropharyngeal carotid arteries as seen on prior CT of cervical spine. No significant loss of vertebral body or disc space height. Small anterior endplate marginal osteophytes at multiple levels. Thoracic spine: Twelve paired thoracic ribs. Normal thoracic kyphosis without listhesis. Stable mild T3, mild T4, and moderate T10 chronic compression deformities discogenic degenerative changes with loss of intervertebral disc space height and endplate marginal osteophytes of the lower thoracic spine. Normal cervicothoracic junction alignment on the swimmer's view. Lumbar spine: Five complete lumbar type non-rib-bearing vertebral bodies. Normal lumbar lordosis without listhesis. Stable chronic mild T12 and moderate L4 compression deformities no acute fracture identified. Mild loss of intervertebral disc space height at L3-4 and L5-S1. Prominent lower lumbar facet arthrosis. Surgical clips project over the right hemipelvis and  right upper quadrant. IMPRESSION: 1. No acute fracture or dislocation identified in the cervical, thoracic, or lumbar spine. 2. Stable chronic T3, T4, T10, T12, and L4 compression deformities. Electronically Signed   By: Mitzi Hansen M.D.   On: 11/10/2018 16:45   Dg Lumbar Spine Complete  Result Date: 11/10/2018 CLINICAL DATA:  73 y/o  F; fall with back and neck pain. EXAM: THORACIC SPINE - 3 VIEWS; CERVICAL SPINE - COMPLETE 4+ VIEW; LUMBAR SPINE - COMPLETE 4+ VIEW COMPARISON:  02/01/2018 CT of head, cervical spine, thoracic spine, lumbar spine. FINDINGS: Cervical spine: C1-C5 visible on the  lateral view. Normal cervical lordosis without listhesis. Odontoid process is intact on the open-mouth view. Normal anterior C1-2 articulation. Prevertebral soft tissue thickening is likely due to retropharyngeal carotid arteries as seen on prior CT of cervical spine. No significant loss of vertebral body or disc space height. Small anterior endplate marginal osteophytes at multiple levels. Thoracic spine: Twelve paired thoracic ribs. Normal thoracic kyphosis without listhesis. Stable mild T3, mild T4, and moderate T10 chronic compression deformities discogenic degenerative changes with loss of intervertebral disc space height and endplate marginal osteophytes of the lower thoracic spine. Normal cervicothoracic junction alignment on the swimmer's view. Lumbar spine: Five complete lumbar type non-rib-bearing vertebral bodies. Normal lumbar lordosis without listhesis. Stable chronic mild T12 and moderate L4 compression deformities no acute fracture identified. Mild loss of intervertebral disc space height at L3-4 and L5-S1. Prominent lower lumbar facet arthrosis. Surgical clips project over the right hemipelvis and right upper quadrant. IMPRESSION: 1. No acute fracture or dislocation identified in the cervical, thoracic, or lumbar spine. 2. Stable chronic T3, T4, T10, T12, and L4 compression deformities.  Electronically Signed   By: Mitzi Hansen M.D.   On: 11/10/2018 16:45   Ct Head Wo Contrast  Result Date: 11/10/2018 CLINICAL DATA:  Fall with craniofacial trauma.  Headache. EXAM: CT HEAD WITHOUT CONTRAST TECHNIQUE: Contiguous axial images were obtained from the base of the skull through the vertex without intravenous contrast. COMPARISON:  02/01/2018 FINDINGS: Brain: The brainstem, cerebellum, cerebral peduncles, thalami, basal ganglia, basilar cisterns, and ventricular system appear within normal limits. No intracranial hemorrhage, mass lesion, or acute CVA. Vascular: There is atherosclerotic calcification of the cavernous carotid arteries bilaterally. Skull: Unremarkable Sinuses/Orbits: Unremarkable Other: No supplemental non-categorized findings. IMPRESSION: 1. No acute intracranial findings. 2. Atherosclerosis. Electronically Signed   By: Gaylyn Rong M.D.   On: 11/10/2018 16:42   Ct Angio Chest Pe W Or Wo Contrast  Addendum Date: 11/16/2018   ADDENDUM REPORT: 11/16/2018 12:22 ADDENDUM: Critical Value/emergent results were called by telephone at the time of interpretation on 11/16/2018 at 12:22 pm to Dr. Jarvis Newcomer, who verbally acknowledged these results. Electronically Signed   By: Lupita Raider, M.D.   On: 11/16/2018 12:22   Result Date: 11/16/2018 CLINICAL DATA:  Shortness of breath. EXAM: CT ANGIOGRAPHY CHEST WITH CONTRAST TECHNIQUE: Multidetector CT imaging of the chest was performed using the standard protocol during bolus administration of intravenous contrast. Multiplanar CT image reconstructions and MIPs were obtained to evaluate the vascular anatomy. CONTRAST:  ISOVUE-370 IOPAMIDOL (ISOVUE-370) INJECTION 76% COMPARISON:  Radiographs of November 15, 2018. FINDINGS: Cardiovascular: Small linear filling defect is noted in lower lobe branch of right pulmonary artery concerning for nonocclusive pulmonary embolus. RV/LV ratio of 0.6 is noted which is within normal limits.  Atherosclerosis of thoracic aorta is noted without aneurysm or dissection. Coronary artery calcifications are noted. No pericardial effusion is noted. Mediastinum/Nodes: No enlarged mediastinal, hilar, or axillary lymph nodes. Thyroid gland, trachea, and esophagus demonstrate no significant findings. Lungs/Pleura: No pneumothorax is noted. Mild bilateral pleural effusions are noted with adjacent subsegmental atelectasis of both lower lobes. Upper Abdomen: No acute abnormality. Musculoskeletal: Old lower thoracic vertebral body fracture is noted. No acute osseous abnormality is noted. Review of the MIP images confirms the above findings. IMPRESSION: Small linear filling defect seen in lower lobe branch of right pulmonary artery consistent with nonocclusive pulmonary embolus. Coronary artery calcifications are noted suggesting coronary artery disease. Mild bilateral pleural effusions are noted with adjacent subsegmental atelectasis of both lower lobes.  Old lower thoracic vertebral body fracture is noted. Aortic Atherosclerosis (ICD10-I70.0). Electronically Signed: By: Lupita Raider, M.D. On: 11/16/2018 12:10   Vas [place] Lower Extremity Venous (dvt)  Result Date: 11/17/2018  Lower Venous Study Indications: Pulmonary embolism.  Performing Technologist: Chanda Busing RVT  Examination Guidelines: A complete evaluation includes B-mode imaging, spectral Doppler, color Doppler, and power Doppler as needed of all accessible portions of each vessel. Bilateral testing is considered an integral part of a complete examination. Limited examinations for reoccurring indications may be performed as noted.  Right Venous Findings: +---------+---------------+---------+-----------+----------+-------+          CompressibilityPhasicitySpontaneityPropertiesSummary +---------+---------------+---------+-----------+----------+-------+ CFV      Full           Yes      Yes                           +---------+---------------+---------+-----------+----------+-------+ SFJ      Full                                                 +---------+---------------+---------+-----------+----------+-------+ FV Prox  Full                                                 +---------+---------------+---------+-----------+----------+-------+ FV Mid   Full                                                 +---------+---------------+---------+-----------+----------+-------+ FV DistalFull                                                 +---------+---------------+---------+-----------+----------+-------+ PFV      Full                                                 +---------+---------------+---------+-----------+----------+-------+ POP      Full           Yes      Yes                          +---------+---------------+---------+-----------+----------+-------+ PTV      Full                                                 +---------+---------------+---------+-----------+----------+-------+ PERO     Full                                                 +---------+---------------+---------+-----------+----------+-------+  Left Venous Findings: +---------+---------------+---------+-----------+----------+-------+  CompressibilityPhasicitySpontaneityPropertiesSummary +---------+---------------+---------+-----------+----------+-------+ CFV      Full           Yes      Yes                          +---------+---------------+---------+-----------+----------+-------+ SFJ      Full                                                 +---------+---------------+---------+-----------+----------+-------+ FV Prox  Full                                                 +---------+---------------+---------+-----------+----------+-------+ FV Mid   Full                                                  +---------+---------------+---------+-----------+----------+-------+ FV DistalFull                                                 +---------+---------------+---------+-----------+----------+-------+ PFV      Full                                                 +---------+---------------+---------+-----------+----------+-------+ POP      Full           Yes      Yes                          +---------+---------------+---------+-----------+----------+-------+ PTV      Full                                                 +---------+---------------+---------+-----------+----------+-------+ PERO     Full                                                 +---------+---------------+---------+-----------+----------+-------+    Summary: Right: There is no evidence of deep vein thrombosis in the lower extremity. No cystic structure found in the popliteal fossa. Left: There is no evidence of deep vein thrombosis in the lower extremity. No cystic structure found in the popliteal fossa.  *See table(s) above for measurements and observations. Electronically signed by Sherald Hesshristopher Clark MD on 11/17/2018 at 4:02:39 PM.    Final     Subjective: Eager to go home  Discharge Exam: Vitals:   11/17/18 2204 11/18/18 0533  BP: (!) 148/52 (!) 146/76  Pulse: 92 86  Resp: 18 20  Temp: 98.1 F (36.7 C) 97.8 F (36.6 C)  SpO2: 94% 97%  Vitals:   11/17/18 1623 11/17/18 1946 11/17/18 2204 11/18/18 0533  BP: 139/73  (!) 148/52 (!) 146/76  Pulse: 82  92 86  Resp: 16  18 20   Temp: (!) 97.5 F (36.4 C)  98.1 F (36.7 C) 97.8 F (36.6 C)  TempSrc: Oral  Oral Oral  SpO2: 100% 90% 94% 97%  Weight:    96.8 kg  Height:        General: Pt is alert, awake, not in acute distress Cardiovascular: RRR, S1/S2 +, no rubs, no gallops Respiratory: CTA bilaterally, no wheezing, no rhonchi Abdominal: Soft, NT, ND, bowel sounds + Extremities: no edema, no cyanosis   The results of significant  diagnostics from this hospitalization (including imaging, microbiology, ancillary and laboratory) are listed below for reference.     Microbiology: Recent Results (from the past 240 hour(s))  Culture, sputum-assessment     Status: None   Collection Time: 11/15/18  6:44 PM  Result Value Ref Range Status   Specimen Description EXPECTORATED SPUTUM  Final   Special Requests Normal  Final   Sputum evaluation   Final    THIS SPECIMEN IS ACCEPTABLE FOR SPUTUM CULTURE Performed at [facility], [address] [place], [place] [address]    Report Status 11/15/2018 FINAL  Final  Culture, respiratory     Status: None   Collection Time: 11/15/18  6:44 PM  Result Value Ref Range Status   Specimen Description   Final    EXPECTORATED SPUTUM Performed at [facility], [address], [place], [place] [address]    Special Requests   Final    Normal Reflexed from [id number] Performed at [facility], [address] [place], [place] [address]    Gram Stain   Final    ABUNDANT WBC PRESENT, PREDOMINANTLY PMN MODERATE GRAM POSITIVE RODS FEW GRAM POSITIVE COCCI RARE YEAST WITH PSEUDOHYPHAE    Culture   Final    MODERATE Consistent with normal respiratory flora. Performed at [facility], [address] [place], [place] [address]    Report Status 11/18/2018 FINAL  Final  Culture, blood (routine x 2) Call MD if unable to obtain prior to antibiotics being given     Status: None (Preliminary result)   Collection Time: 11/15/18  7:06 PM  Result Value Ref Range Status   Specimen Description   Final    BLOOD RIGHT HAND Performed at [facility], [address], [place], [place] [address]    Special Requests   Final    BOTTLES DRAWN AEROBIC AND ANAEROBIC Blood Culture adequate volume Performed at [facility], [address] [place], [place] [address]    Culture   Final    NO GROWTH 3  DAYS Performed at [facility], [address], [place], [place] [address]    Report Status PENDING  Incomplete  Culture, blood (routine x 2) Call MD if unable to obtain prior to antibiotics being given     Status: None (Preliminary result)   Collection Time: 11/15/18  7:15 PM  Result Value Ref Range Status   Specimen Description   Final    BLOOD RIGHT ANTECUBITAL Performed at [facility], [address] [place], [place] [address]    Special Requests   Final    BOTTLES DRAWN AEROBIC AND ANAEROBIC Blood Culture adequate volume Performed at [facility], [address], [place], [place] [address]    Culture  Final    NO GROWTH 3 DAYS Performed at [facility], [address] [place], [place] [address]    Report Status PENDING  Incomplete  MRSA PCR Screening     Status: None   Collection Time: 11/16/18 12:58 AM  Result Value Ref Range Status   MRSA by PCR NEGATIVE NEGATIVE Final    Comment:        The GeneXpert MRSA Assay (FDA approved for NASAL specimens only), is one component of a comprehensive MRSA colonization surveillance program. It is not intended to diagnose MRSA infection nor to guide or monitor treatment for MRSA infections. Performed at [facility], [address] [place], [place] [address]   Urine Culture     Status: Abnormal   Collection Time: 11/16/18  6:08 AM  Result Value Ref Range Status   Specimen Description   Final    URINE, CLEAN CATCH Performed at [facility], [address] [place], [place] [address]    Special Requests   Final    NONE Performed at [facility], [address] [place], [place] [address]    Culture (A)  Final    <10,000 COLONIES/mL INSIGNIFICANT GROWTH Performed at [facility], [address] [place], [place] [address]    Report Status 11/17/2018 FINAL  Final     Labs: BNP (last 3 results) No results  for input(s): BNP in the last 8760 hours. Basic Metabolic Panel: Recent Labs  Lab 11/15/18 1423 11/16/18 0126 11/17/18 0239  NA 140 140 139  K 4.1 4.6 4.2  CL 103 105 101  CO2 31 29 29   GLUCOSE 158* 158* 279*  BUN 15 15 27*  CREATININE 1.04* 1.02* 1.11*  CALCIUM 9.9 9.3 9.1  MG  --  2.2  --   PHOS  --  3.6  --    Liver Function Tests: Recent Labs  Lab 11/16/18 0126  AST 22  ALT 27  ALKPHOS 65  BILITOT 0.5  PROT 7.1  ALBUMIN 3.6   No results for input(s): LIPASE, AMYLASE in the last 168 hours. No results for input(s): AMMONIA in the last 168 hours. CBC: Recent Labs  Lab 11/15/18 1423 11/16/18 0126 11/17/18 0239 11/18/18 0549  WBC 8.8 10.5 13.6* 14.7*  NEUTROABS 5.4  --   --   --   HGB 14.5 15.2* 13.3 15.2*  HCT 44.7 47.0* 42.1 45.8  MCV 97.0 99.8 99.1 95.2  PLT 207 184 196 187   Cardiac Enzymes: Recent Labs  Lab 11/15/18 1423 11/15/18 1906 11/16/18 0126 11/16/18 0734  TROPONINI <0.03 <0.03 <0.03 <0.03   BNP: Invalid input(s): POCBNP CBG: Recent Labs  Lab 11/17/18 1218 11/17/18 1625 11/17/18 2201 11/18/18 0744 11/18/18 1119  GLUCAP 321* 290* 337* 120* 137*   D-Dimer Recent Labs    11/15/18 1906  DDIMER 0.80*   Hgb A1c Recent Labs    11/15/18 1906  HGBA1C 7.2*   Lipid Profile No results for input(s): CHOL, HDL, LDLCALC, TRIG, CHOLHDL, LDLDIRECT in the last 72 hours. Thyroid function studies Recent Labs    11/16/18 0126  TSH 1.128   Anemia work up No results for input(s): VITAMINB12, FOLATE, FERRITIN, TIBC, IRON, RETICCTPCT in the last 72 hours. Urinalysis    Component Value Date/Time   COLORURINE YELLOW 11/15/2018 1435   APPEARANCEUR HAZY (A) 11/15/2018 1435   LABSPEC 1.010 11/15/2018 1435   PHURINE 7.0 11/15/2018 1435   GLUCOSEU NEGATIVE 11/15/2018 1435   HGBUR  TRACE (A) 11/15/2018 1435   BILIRUBINUR NEGATIVE 11/15/2018 1435   KETONESUR NEGATIVE 11/15/2018 1435   PROTEINUR NEGATIVE 11/15/2018 1435   NITRITE NEGATIVE  11/15/2018 1435   LEUKOCYTESUR NEGATIVE 11/15/2018 1435   Sepsis Labs Invalid input(s): PROCALCITONIN,  WBC,  LACTICIDVEN Microbiology Recent Results (from the past 240 hour(s))  Culture, sputum-assessment     Status: None   Collection Time: 11/15/18  6:44 PM  Result Value Ref Range Status   Specimen Description EXPECTORATED SPUTUM  Final   Special Requests Normal  Final   Sputum evaluation   Final    THIS SPECIMEN IS ACCEPTABLE FOR SPUTUM CULTURE Performed at [facility], [address] [place], [place] [address]    Report Status 11/15/2018 FINAL  Final  Culture, respiratory     Status: None   Collection Time: 11/15/18  6:44 PM  Result Value Ref Range Status   Specimen Description   Final    EXPECTORATED SPUTUM Performed at [facility] [address] [place] [address] [place], [place] [address]    Special Requests   Final    Normal Reflexed from [id number] Performed at [facility], [address] [place], [place] [address]    Gram Stain   Final    ABUNDANT WBC PRESENT, PREDOMINANTLY PMN MODERATE GRAM POSITIVE RODS FEW GRAM POSITIVE COCCI RARE YEAST WITH PSEUDOHYPHAE    Culture   Final    MODERATE Consistent with normal respiratory flora. Performed at [facility], [address], [place], [place] [address]    Report Status 11/18/2018 FINAL  Final  Culture, blood (routine x 2) Call MD if unable to obtain prior to antibiotics being given     Status: None (Preliminary result)   Collection Time: 11/15/18  7:06 PM  Result Value Ref Range Status   Specimen Description   Final    BLOOD RIGHT HAND Performed at [facility], [address] [place], [place] [address]    Special Requests   Final    BOTTLES DRAWN AEROBIC AND ANAEROBIC Blood Culture adequate volume Performed at [facility], [address], [place], [place] [address]    Culture   Final    NO GROWTH 3 DAYS Performed at [facility], [address] [place], [place] [address]    Report Status PENDING  Incomplete  Culture, blood (routine x 2) Call MD if unable to obtain prior to antibiotics being given     Status: None (Preliminary result)   Collection Time: 11/15/18  7:15 PM  Result Value Ref Range Status   Specimen Description   Final    BLOOD RIGHT ANTECUBITAL Performed at [facility], [address], [place], [place] [address]    Special Requests   Final    BOTTLES DRAWN AEROBIC AND ANAEROBIC Blood Culture adequate volume Performed at [facility], [address] [place], [place] [address]    Culture   Final    NO GROWTH 3 DAYS Performed at [facility], [address], [place], [place] [address]    Report Status PENDING  Incomplete  MRSA PCR Screening     Status: None   Collection Time: 11/16/18 12:58 AM  Result Value Ref Range Status   MRSA by PCR NEGATIVE NEGATIVE Final    Comment:        The GeneXpert MRSA Assay (FDA approved for NASAL specimens only), is one component of a comprehensive MRSA colonization  surveillance program. It is not intended to diagnose MRSA infection nor to guide or monitor treatment for MRSA infections. Performed at [facility], [address] [place], [place] [address]   Urine Culture     Status: Abnormal   Collection Time: 11/16/18  6:08 AM  Result Value Ref Range Status   Specimen Description   Final    URINE, CLEAN CATCH Performed at [facility], [address] [place], [place] [address]    Special Requests   Final    NONE Performed at [facility], [address], [place], [place] [address]    Culture (A)  Final    <10,000 COLONIES/mL INSIGNIFICANT GROWTH Performed at [facility], [address] [place], [place] [address]    Report Status 11/17/2018 FINAL  Final   Time spent: 30 min  SIGNED:   Rickey Barbara, MD  [facility] 11/18/2018, 12:58 PM  If 7PM-7AM, please contact night-coverage

## 2018-11-18 NOTE — Care Management Note (Signed)
Case Management Note  Patient Details  Name: Valerie Keller MRN: [id number] Date of Birth: 11/27/1945  Subjective/Objective: PT recc HHPT. Patient has rw,w/c,3n1. [facility] BraunKaren aware of HHPT orders.Has home 02 travel tank. No further CM needs.                   Action/Plan:dc home w/HHC.   Expected Discharge Date:  (unknown)               Expected Discharge Plan:  Home w Home Health Services  In-House Referral:     Discharge planning Services  CM Consult  Post Acute Care Choice:  Durable Medical Equipment([employer]-home 02,has travel tank) Choice offered to:  Patient  DME Arranged:    DME Agency:     HH Arranged:  PT HH Agency:  [facility]  Status of Service:  Completed, signed off  If discussed at Long Length of Stay Meetings, dates discussed:    Additional Comments:  Lanier ClamMahabir, Lena Fieldhouse, RN 11/18/2018, 1:54 PM

## 2018-11-18 NOTE — Progress Notes (Signed)
  Echocardiogram 2D Echocardiogram with definity has been performed.  Leta Jungling M 11/18/2018, 12:29 PM

## 2018-11-18 NOTE — Progress Notes (Signed)
While patient awaiting for discharge pending ECHO results and discharge orders from provider. 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 Spouse at bedside with patient consoling and reassuring.

## 2018-11-20 LAB — CULTURE, BLOOD (ROUTINE X 2)
Culture: NO GROWTH
Culture: NO GROWTH
SPECIAL REQUESTS: ADEQUATE
Special Requests: ADEQUATE

## 2018-11-22 ENCOUNTER — Encounter (HOSPITAL_COMMUNITY): Payer: Self-pay | Admitting: *Deleted

## 2018-11-22 ENCOUNTER — Emergency Department (HOSPITAL_COMMUNITY)
Admission: EM | Admit: 2018-11-22 | Discharge: 2018-11-22 | Disposition: A | Discharge status: Home / Self Care | Payer: Medicare Other | Attending: Emergency Medicine | Admitting: Emergency Medicine

## 2018-11-22 ENCOUNTER — Other Ambulatory Visit: Payer: Self-pay

## 2018-11-22 DIAGNOSIS — R251 Tremor, unspecified: Secondary | ICD-10-CM | POA: Diagnosis not present

## 2018-11-22 DIAGNOSIS — M542 Cervicalgia: Secondary | ICD-10-CM | POA: Diagnosis not present

## 2018-11-22 DIAGNOSIS — I10 Essential (primary) hypertension: Secondary | ICD-10-CM | POA: Insufficient documentation

## 2018-11-22 DIAGNOSIS — Z5321 Procedure and treatment not carried out due to patient leaving prior to being seen by health care provider: Secondary | ICD-10-CM | POA: Diagnosis not present

## 2018-11-22 LAB — COMPREHENSIVE METABOLIC PANEL
ALBUMIN: 3.9 g/dL (ref 3.5–5.0)
ALT: 37 U/L (ref 0–44)
AST: 33 U/L (ref 15–41)
Alkaline Phosphatase: 69 U/L (ref 38–126)
Anion gap: 15 (ref 5–15)
BUN: 25 mg/dL — AB (ref 8–23)
CO2: 25 mmol/L (ref 22–32)
Calcium: 10.4 mg/dL — ABNORMAL HIGH (ref 8.9–10.3)
Chloride: 100 mmol/L (ref 98–111)
Creatinine, Ser: 1.1 mg/dL — ABNORMAL HIGH (ref 0.44–1.00)
GFR calc Af Amer: 58 mL/min — ABNORMAL LOW (ref >60)
GFR calc non Af Amer: 50 mL/min — ABNORMAL LOW (ref >60)
Glucose, Bld: 143 mg/dL — ABNORMAL HIGH (ref 70–99)
Potassium: 3.5 mmol/L (ref 3.5–5.1)
Sodium: 140 mmol/L (ref 135–145)
Total Bilirubin: 1.2 mg/dL (ref 0.3–1.2)
Total Protein: 7.5 g/dL (ref 6.5–8.1)

## 2018-11-22 LAB — LIPASE, BLOOD: Lipase: 30 U/L (ref 11–51)

## 2018-11-22 LAB — CBC
HCT: 53.3 % — ABNORMAL HIGH (ref 36.0–46.0)
Hemoglobin: 17.8 g/dL — ABNORMAL HIGH (ref 12.0–15.0)
MCH: 31.3 pg (ref 26.0–34.0)
MCHC: 33.4 g/dL (ref 30.0–36.0)
MCV: 93.8 fL (ref 80.0–100.0)
Platelets: 257 10*3/uL (ref 150–400)
RBC: 5.68 MIL/uL — ABNORMAL HIGH (ref 3.87–5.11)
RDW: 13.8 % (ref 11.5–15.5)
WBC: 14.3 10*3/uL — ABNORMAL HIGH (ref 4.0–10.5)
nRBC: 0 % (ref 0.0–0.2)

## 2018-11-22 MED ORDER — SODIUM CHLORIDE 0.9% FLUSH: 3.0000 mL | Freq: Once | INTRAVENOUS | Status: DC

## 2018-11-22 NOTE — ED Notes (Signed)
Pt states she is leaving and can not wait.

## 2018-11-24 NOTE — Progress Notes (Signed)
Referring-Valerie Lyn Hollingshead, Valerie Keller Reason for referral-CAD  HPI: 73 year old female for evaluation of CAD at request of Sunnie Nielsen, Valerie Keller.  Pt with previous MI in [place]; had PCI with 3 stents; records not available. Patient admitted with worsening dyspnea January 2020.  This was felt to be secondary to pneumonia and small pulmonary embolus.  Venous Dopplers January 2020 showed no DVT.  CTA January 2020 showed small pulmonary embolus and coronary calcification.  There was small bilateral pleural effusions.  Echocardiogram January 2020 showed ejection fraction 45 to 50%, grade 1 diastolic dysfunction, hypokinesis of the apical septum, apical inferior and apical myocardium, mild mitral regurgitation.  Patient does have dyspnea on exertion and fatigue.  She does not have exertional chest pain, palpitations or syncope.  Because of her history of coronary disease cardiology asked to evaluate.  Current Outpatient Medications  Medication Sig Dispense Refill  . albuterol (PROVENTIL) (2.5 MG/3ML) 0.083% nebulizer solution Take 3 mLs by nebulization every 4 (four) hours as needed for shortness of breath. 75 mL 99  . AMBULATORY NON FORMULARY MEDICATION Blood pressure monitor, around upper arm, not wrist.  Per patient preference and insurance coverage. 1 Units prn  . AMBULATORY NON FORMULARY MEDICATION Nebulizer device miscellaneous: Please include machine, necessary tubing and masks.  Dispensed per patient preference and insurance coverage.  Diagnosis COPD, pneumonia. 1 Units prn  . ARIPiprazole (ABILIFY) 10 MG tablet Take 10 mg by mouth daily.    Marland Kitchen aspirin 81 MG tablet Take 1 tablet (81 mg total) by mouth daily. 90 tablet 3  . atorvastatin (LIPITOR) 40 MG tablet Take 1 tablet (40 mg total) by mouth at bedtime. 90 tablet 3  . clonazepam (KLONOPIN) 2 MG disintegrating tablet Take 2 mg by mouth 3 (three) times daily as needed (anxiety).     . DULoxetine (CYMBALTA) 60 MG capsule Take 120 mg by mouth  daily.     . fluticasone (FLONASE ALLERGY RELIEF) 50 MCG/ACT nasal spray Place 2 sprays into the nose daily as needed for allergies.    . furosemide (LASIX) 40 MG tablet Take 2 tablets (80 mg total) by mouth daily. (Patient taking differently: Take 40 mg by mouth 2 (two) times daily. ) 180 tablet 3  . HUMALOG KWIKPEN 100 UNIT/ML KwikPen Inject 7 Units into the skin 3 (three) times daily before meals.    . Insulin Pen Needle (PEN NEEDLES) 31G X 8 MM MISC For use with insulin pen qid as directed, please dispense per patient preference and insurance coverage 200 each 99  . Ipratropium-Albuterol (COMBIVENT RESPIMAT) 20-100 MCG/ACT AERS respimat Inhale 1 puff into the lungs every 6 (six) hours as needed for wheezing. 16 g 3  . ipratropium-albuterol (DUONEB) 0.5-2.5 (3) MG/3ML SOLN Take 3 mLs by nebulization every 2 (two) hours as needed (wheeze, SOB). 60 mL 3  . LANTUS SOLOSTAR 100 UNIT/ML Solostar Pen Inject 34 Units into the skin at bedtime. 15 mL 0  . Magnesium Oxide 400 (240 Mg) MG TABS Take 1-2 tablets by mouth See admin instructions. 1 tablet in the AM and 2 tablets in the evening    . metFORMIN (GLUCOPHAGE) 850 MG tablet Take 0.5 tablets (425 mg total) by mouth 2 (two) times daily. 180 tablet 3  . metoprolol succinate (TOPROL-XL) 25 MG 24 hr tablet Take 0.5 tablets (12.5 mg total) by mouth daily for 30 days. 15 tablet 0  . Multiple Vitamin (MULTIVITAMIN) capsule Take 1 capsule by mouth daily.    . nitroGLYCERIN (NITROSTAT) 0.4 MG  SL tablet Place 1 tablet (0.4 mg total) under the tongue every 5 (five) minutes as needed for chest pain. 30 tablet 3  . ondansetron (ZOFRAN-ODT) 8 MG disintegrating tablet Take 1 tablet (8 mg total) by mouth every 8 (eight) hours as needed for nausea. 60 tablet 3  . potassium chloride SA (K-DUR,KLOR-CON) 20 MEQ tablet Take 20 mEq by mouth daily.    . Rivaroxaban (XARELTO) 15 MG TABS tablet Take 1 tablet (15 mg total) by mouth 2 (two) times daily with a meal for 19 days. 38  tablet 0  . [START ON 12/08/2018] rivaroxaban (XARELTO) 20 MG TABS tablet Take 1 tablet (20 mg total) by mouth daily with supper for 30 days. 30 tablet 0  . rOPINIRole (REQUIP) 2 MG tablet Take 6 mg by mouth at bedtime.     Marland Kitchen. spironolactone (ALDACTONE) 25 MG tablet Take 1 tablet (25 mg total) by mouth daily. 90 tablet 1  . Vitamin D, Ergocalciferol, (DRISDOL) 1.25 MG (50000 UT) CAPS capsule Take 1 capsule (50,000 Units total) by mouth every 14 (fourteen) days. 12 capsule 0   No current facility-administered medications for this visit.     Allergies  Allergen Reactions  . Morphine Itching and Rash  . Ciprofloxacin Other (See Comments)    AMS  . Latex Hives and Other (See Comments)    Other reaction(s): Other (See Comments)  . Promethazine Other (See Comments)  . Ibuprofen   . Penicillins Hives    Did it involve swelling of the face/tongue/throat, SOB, or low BP? No Did it involve sudden or severe rash/hives, skin peeling, or any reaction on the inside of your mouth or nose? No Did you need to seek medical attention at a hospital or doctor's office? No When did it last happen? If all above answers are "NO", may proceed with cephalosporin use.      Past Medical History:  Diagnosis Date  . Diabetes mellitus without complication (HCC)   . Fibromyalgia   . High blood pressure   . High cholesterol   . Lung disease   . Myocardial infarction (HCC)   . Nephrolithiasis   . Pulmonary embolus (HCC)   . Renal disorder     Past Surgical History:  Procedure Laterality Date  . ABDOMINAL HYSTERECTOMY    . CHOLECYSTECTOMY    . CORONARY STENT PLACEMENT     x3  . FOOT SURGERY    . KIDNEY SURGERY    . TONSILLECTOMY      Social History   Socioeconomic History  . Marital status: Married    Spouse name: Not on file  . Number of children: 1  . Years of education: Not on file  . Highest education level: Not on file  Occupational History  . Not on file  Social Needs  . Financial  resource strain: Not on file  . Food insecurity:    Worry: Not on file    Inability: Not on file  . Transportation needs:    Medical: Not on file    Non-medical: Not on file  Tobacco Use  . Smoking status: Former Games developermoker  . Smokeless tobacco: Never Used  Substance and Sexual Activity  . Alcohol use: Never    Frequency: Never  . Drug use: Never  . Sexual activity: Not Currently  Lifestyle  . Physical activity:    Days per week: Not on file    Minutes per session: Not on file  . Stress: Not on file  Relationships  .  Social connections:    Talks on phone: Not on file    Gets together: Not on file    Attends religious service: Not on file    Active member of club or organization: Not on file    Attends meetings of clubs or organizations: Not on file    Relationship status: Not on file  . Intimate partner violence:    Fear of current or ex partner: Not on file    Emotionally abused: Not on file    Physically abused: Not on file    Forced sexual activity: Not on file  Other Topics Concern  . Not on file  Social History Narrative  . Not on file    Family History  Problem Relation Age of Onset  . CAD Brother   . Diabetes Neg Hx   . Cancer Neg Hx     ROS: Fatigue and arthralgias but no fevers or chills, productive cough, hemoptysis, dysphasia, odynophagia, melena, hematochezia, dysuria, hematuria, rash, seizure activity, orthopnea, PND, pedal edema, claudication. Remaining systems are negative.  Physical Exam:   Blood pressure (!) 167/88, pulse 88, height 5\' 1"  (1.549 m), weight 209 lb 1.9 oz (94.9 kg).  General:  Well developed/morbidly obese in NAD Skin warm/dry Patient not depressed No peripheral clubbing Back-normal HEENT-normal/normal eyelids Neck supple/normal carotid upstroke bilaterally; no bruits; no JVD; no thyromegaly chest - CTA/ normal expansion CV - RRR/normal S1 and S2; no murmurs, rubs or gallops;  PMI nondisplaced Abdomen -NT/ND, no HSM, no mass, +  bowel sounds, no bruit 2+ femoral pulses, no bruits Ext-trace edema, chords, 2+ DP Neuro-grossly nonfocal  ECG -normal sinus rhythm at a rate of 88, left ventricular hypertrophy, nonspecific lateral T wave changes.  Personally reviewed  A/P  1 coronary artery disease-patient with history of PCI approximately 10 years ago following myocardial infarction.  Echocardiogram shows ejection fraction 45 to 50%.  Given need for Xarelto for recent pulmonary embolus I will discontinue aspirin.  Continue statin.  Continue beta-blocker.  2 ischemic cardiomyopathy-mildly reduced LV function.  Continue beta-blocker.  Blood pressure is elevated.  I will add losartan 50 mg daily.  Check potassium and renal function in 1 week.  3 hypertension-blood pressure is elevated.  Add losartan as outlined above.  Follow blood pressure and adjust regimen as needed.  4 hyperlipidemia-continue statin.  5 recent pulmonary embolus-plan to continue Xarelto 15 mg twice daily for 1 more week.  Begin 20 mg daily on February 20.  Will need at least 6 months of therapy.  We will need to obtain all records from previous hospitalization.  6 morbid obesity-patient needs weight loss.  Olga Millers, MD

## 2018-11-26 ENCOUNTER — Ambulatory Visit (INDEPENDENT_AMBULATORY_CARE_PROVIDER_SITE_OTHER): Payer: Medicare Other | Admitting: Osteopathic Medicine

## 2018-11-26 ENCOUNTER — Encounter: Payer: Self-pay | Admitting: Osteopathic Medicine

## 2018-11-26 VITALS — BP 137/85 | HR 89 | Wt 201.4 lb

## 2018-11-26 DIAGNOSIS — J449 Chronic obstructive pulmonary disease, unspecified: Secondary | ICD-10-CM

## 2018-11-26 DIAGNOSIS — J189 Pneumonia, unspecified organism: Secondary | ICD-10-CM | POA: Diagnosis not present

## 2018-11-26 DIAGNOSIS — R110 Nausea: Secondary | ICD-10-CM

## 2018-11-26 DIAGNOSIS — Z794 Long term (current) use of insulin: Secondary | ICD-10-CM

## 2018-11-26 DIAGNOSIS — E1165 Type 2 diabetes mellitus with hyperglycemia: Secondary | ICD-10-CM

## 2018-11-26 DIAGNOSIS — I2699 Other pulmonary embolism without acute cor pulmonale: Secondary | ICD-10-CM

## 2018-11-26 MED ORDER — AMBULATORY NON FORMULARY MEDICATION: 99 refills | Status: AC

## 2018-11-26 MED ORDER — IPRATROPIUM-ALBUTEROL 0.5-2.5 (3) MG/3ML IN SOLN: 3.0000 mL | RESPIRATORY_TRACT | 3 refills | Status: DC | PRN

## 2018-11-26 MED ORDER — ONDANSETRON 8 MG PO TBDP: 8.0000 mg | ORAL_TABLET | Freq: Three times a day (TID) | ORAL | 3 refills | Status: DC | PRN

## 2018-11-26 NOTE — Progress Notes (Signed)
HPI: Valerie Keller is a 73 y.o. female who  has a past medical history of Diabetes mellitus without complication (HCC), Fibromyalgia, High blood pressure, High cholesterol, Lung disease, Myocardial infarction [facility](HCC), and Renal disorder.  she [facility] today, 11/26/18,  for chief complaint of: Hospital follow-up: PNA, PE  Patient has concerns today about blood pressure being elevated and recent ER visit, persistent nausea.  She has some questions about whether or not she needs to follow-up with pulmonary.  Overall, she feels like she is breathing okay, complains of fatigue but is scheduled to be working with home physical therapy.  Hospitalized 11/15/2018 x3 days for acute on chronic hypoxic respiratory failure due to pneumonia and pulmonary embolus, complicated by COPD, OSA.   Pneumonia: Flu was negative, advised complete course of azithromycin on discharge.  Pulmonary embolus: discharged on Xarelto, 2D echo showed ok LV & RV systolic function.  COPD: pulmonary have arranged further follow-up as an outpatient.  Complete course of prednisone on discharge.  Diabetes: A1c was 7.2, control improved a bit.  Continue following with endocrinology.  Wt Readings from Last 3 Encounters:  11/26/18 201 lb 6.4 oz (91.4 kg)  11/18/18 213 lb 8 oz (96.8 kg)  11/10/18 210 lb (95.3 kg)   BP Readings from Last 3 Encounters:  11/26/18 137/85  11/22/18 (!) 169/102  11/18/18 (!) 146/76   ER visit 11/22/2018 for UTI, UCx negative.    Patient is accompanied by cousin who assists with history-taking.       Past medical, surgical, social and family history reviewed:  Patient Active Problem List   Diagnosis Date Noted  . Acute on chronic respiratory failure with hypoxia (HCC) 11/15/2018  . COPD with acute exacerbation (HCC) 11/15/2018  . Hypercapnia 11/15/2018  . Community acquired pneumonia of left lung   . Class 3 severe obesity due to excess  calories in adult (HCC) 10/07/2018  . Hypokalemia 06/25/2018  . Inguinal hernia 06/25/2018  . Coronary artery disease with history of myocardial infarction without history of CABG 05/14/2018  . S/P coronary artery stent placement 05/14/2018  . Female pelvic pain 05/11/2018  . Type 2 diabetes mellitus with hyperglycemia (HCC) 04/12/2018  . Atrophic vaginitis 04/15/2017  . Angina pectoris (HCC) 02/10/2017  . Hyponatremia 02/10/2017  . Vitamin D deficiency 09/01/2016  . Fibromyalgia 08/01/2016  . Anxiety 01/04/2016  . Candidal skin infection 01/04/2016  . Chronic obstructive pulmonary disease, unspecified (HCC) 01/04/2016  . Hyperlipidemia, unspecified 01/04/2016  . Fatigue 06/12/2015  . Morbid obesity with alveolar hypoventilation (HCC) 11/03/2014  . Cardiomyopathy, ischemic 05/16/2014  . Obstructive sleep apnea (adult) (pediatric) 02/18/2013  . Left ventricular diastolic dysfunction 10/06/2012  . Hypomagnesemia 09/09/2012  . Chronic kidney disease, stage 3 (moderate) (HCC) 08/20/2012  . Depression with anxiety 08/20/2012  . Essential hypertension 08/20/2012  . Gastro-esophageal reflux disease without esophagitis 08/20/2012  . Calculus of kidney 08/20/2012    Past Surgical History:  Procedure Laterality Date  . ABDOMINAL HYSTERECTOMY    . CHOLECYSTECTOMY    . CORONARY STENT PLACEMENT     x3  . FOOT SURGERY    . KIDNEY SURGERY    . TONSILLECTOMY      Social History   Tobacco Use  . Smoking status: Never Smoker  . Smokeless tobacco: Never Used  Substance Use Topics  . Alcohol use: Never    Frequency: Never    Family History  Problem Relation Age of Onset  . CAD Brother   .  Diabetes Neg Hx   . Cancer Neg Hx      Current medication list and allergy/intolerance information reviewed:    Current Outpatient Medications  Medication Sig Dispense Refill  . albuterol (PROVENTIL) (2.5 MG/3ML) 0.083% nebulizer solution Take 3 mLs by nebulization every 4 (four) hours as  needed for shortness of breath. 75 mL 99  . AMBULATORY NON FORMULARY MEDICATION Blood pressure monitor, around upper arm, not wrist.  Per patient preference and insurance coverage. 1 Units prn  . AMBULATORY NON FORMULARY MEDICATION Nebulizer device miscellaneous: Please include machine, necessary tubing and masks.  Dispensed per patient preference and insurance coverage.  Diagnosis COPD, pneumonia. 1 Units prn  . ARIPiprazole (ABILIFY) 10 MG tablet Take 10 mg by mouth daily.    Marland Kitchen aspirin 81 MG tablet Take 1 tablet (81 mg total) by mouth daily. 90 tablet 3  . atorvastatin (LIPITOR) 40 MG tablet Take 1 tablet (40 mg total) by mouth at bedtime. 90 tablet 3  . clonazepam (KLONOPIN) 2 MG disintegrating tablet Take 2 mg by mouth 3 (three) times daily as needed (anxiety).     . DULoxetine (CYMBALTA) 60 MG capsule Take 120 mg by mouth daily.     . fluticasone (FLONASE ALLERGY RELIEF) 50 MCG/ACT nasal spray Place 2 sprays into the nose daily as needed for allergies.    . furosemide (LASIX) 40 MG tablet Take 2 tablets (80 mg total) by mouth daily. (Patient taking differently: Take 40 mg by mouth 2 (two) times daily. ) 180 tablet 3  . HUMALOG KWIKPEN 100 UNIT/ML KwikPen Inject 7 Units into the skin 3 (three) times daily before meals.    . Insulin Pen Needle (PEN NEEDLES) 31G X 8 MM MISC For use with insulin pen qid as directed, please dispense per patient preference and insurance coverage 200 each 99  . Ipratropium-Albuterol (COMBIVENT RESPIMAT) 20-100 MCG/ACT AERS respimat Inhale 1 puff into the lungs every 6 (six) hours as needed for wheezing. 16 g 3  . ipratropium-albuterol (DUONEB) 0.5-2.5 (3) MG/3ML SOLN Take 3 mLs by nebulization every 2 (two) hours as needed (wheeze, SOB). 60 mL 3  . LANTUS SOLOSTAR 100 UNIT/ML Solostar Pen Inject 34 Units into the skin at bedtime. 15 mL 0  . Magnesium Oxide 400 (240 Mg) MG TABS Take 1-2 tablets by mouth See admin instructions. 1 tablet in the AM and 2 tablets in the  evening    . metFORMIN (GLUCOPHAGE) 850 MG tablet Take 0.5 tablets (425 mg total) by mouth 2 (two) times daily. 180 tablet 3  . metoprolol succinate (TOPROL-XL) 25 MG 24 hr tablet Take 0.5 tablets (12.5 mg total) by mouth daily for 30 days. 15 tablet 0  . Multiple Vitamin (MULTIVITAMIN) capsule Take 1 capsule by mouth daily.    . nitroGLYCERIN (NITROSTAT) 0.4 MG SL tablet Place 1 tablet (0.4 mg total) under the tongue every 5 (five) minutes as needed for chest pain. 30 tablet 3  . ondansetron (ZOFRAN-ODT) 8 MG disintegrating tablet Take 1 tablet (8 mg total) by mouth every 8 (eight) hours as needed for nausea. 60 tablet 3  . potassium chloride SA (K-DUR,KLOR-CON) 20 MEQ tablet Take 20 mEq by mouth daily.    . predniSONE (DELTASONE) 10 MG tablet Take 1 tablet (10 mg total) by mouth daily. Taper dose: 40mg  po daily x 3 days, then 20mg  po daily x 3 days, then 10mg  po daily x 3 days, then 5mg  po daily x 3 days, then stop, zero refills 30 tablet 0  .  Rivaroxaban (XARELTO) 15 MG TABS tablet Take 1 tablet (15 mg total) by mouth 2 (two) times daily with a meal for 19 days. 38 tablet 0  . [START ON 12/08/2018] rivaroxaban (XARELTO) 20 MG TABS tablet Take 1 tablet (20 mg total) by mouth daily with supper for 30 days. 30 tablet 0  . rOPINIRole (REQUIP) 2 MG tablet Take 6 mg by mouth at bedtime.     Marland Kitchen. spironolactone (ALDACTONE) 25 MG tablet Take 1 tablet (25 mg total) by mouth daily. 90 tablet 1  . Vitamin D, Ergocalciferol, (DRISDOL) 1.25 MG (50000 UT) CAPS capsule Take 1 capsule (50,000 Units total) by mouth every 14 (fourteen) days. 12 capsule 0   No current facility-administered medications for this visit.     Allergies  Allergen Reactions  . Morphine Itching and Rash  . Ciprofloxacin Other (See Comments)    AMS  . Latex Hives and Other (See Comments)    Other reaction(s): Other (See Comments)  . Promethazine Other (See Comments)  . Ibuprofen   . Penicillins Hives    Did it involve swelling of the  face/tongue/throat, SOB, or low BP? No Did it involve sudden or severe rash/hives, skin peeling, or any reaction on the inside of your mouth or nose? No Did you need to seek medical attention at a hospital or doctor's office? No When did it last happen? If all above answers are "NO", may proceed with cephalosporin use.       Review of Systems:  Constitutional:  No  fever, no chills, +recent illness, No unintentional weight changes. +significant fatigue.   HEENT: No  headache, no vision change  Cardiac: No  chest pain, No  pressure  Respiratory:  No  shortness of breath. +Cough  Gastrointestinal: No  abdominal pain, +nausea, No  vomiting  Musculoskeletal: No new myalgia/arthralgia  Skin: No  Rash, No other wounds/concerning lesions  Genitourinary: No  incontinence, No  abnormal genital bleeding, No abnormal genital discharge  Neurologic: +nonfocal weakness, No  dizziness, No  slurred speech/focal weakness/facial droop  Psychiatric: No  concerns with depression, No  concerns with anxiety  Exam:  BP 137/85 (BP Location: Left Arm, Patient Position: Sitting, Cuff Size: Normal)   Pulse 89   Wt 201 lb 6.4 oz (91.4 kg)   BMI 38.05 kg/m   Constitutional: VS see above. General Appearance: alert, well-developed, well-nourished, NAD  Eyes: Normal lids and conjunctive, non-icteric sclera  Ears, Nose, Mouth, Throat: MMM, Normal external inspection ears/nares/mouth/lips/gums.   Neck: No masses, trachea midline. No thyroid enlargement.   Respiratory: Normal respiratory effort. no wheeze, no rhonchi, no rales  Cardiovascular: S1/S2 normal, no murmur, no rub/gallop auscultated. RRR. No lower extremity edema.   Gastrointestinal: habitus limits exam.   Musculoskeletal: Gait not examined, pt in wheelchair but able to get in and out of this to check weight ok  Neurological: Normal balance/coordination. +essential tremor.  Skin: warm, dry, intact. No rash/ulcer.   Psychiatric:  Normal judgment/insight. Normal mood and affect. Oriented x3.      ASSESSMENT/PLAN: The primary encounter diagnosis was Chronic obstructive pulmonary disease, unspecified COPD type (HCC). Diagnoses of Type 2 diabetes mellitus with hyperglycemia, with long-term current use of insulin (HCC), Nausea, Pneumonia due to infectious organism, unspecified laterality, unspecified part of lung, and Acute pulmonary embolism without acute cor pulmonale, unspecified pulmonary embolism type [facility](HCC) were also pertinent to this visit.     Meds ordered this encounter  Medications  . AMBULATORY NON FORMULARY MEDICATION    Sig:  Blood pressure monitor, around upper arm, not wrist.  Per patient preference and insurance coverage.    Dispense:  1 Units    Refill:  prn  . AMBULATORY NON FORMULARY MEDICATION    Sig: Nebulizer device miscellaneous: Please include machine, necessary tubing and masks.  Dispensed per patient preference and insurance coverage.  Diagnosis COPD, pneumonia.    Dispense:  1 Units    Refill:  prn  . ondansetron (ZOFRAN-ODT) 8 MG disintegrating tablet    Sig: Take 1 tablet (8 mg total) by mouth every 8 (eight) hours as needed for nausea.    Dispense:  60 tablet    Refill:  3  . ipratropium-albuterol (DUONEB) 0.5-2.5 (3) MG/3ML SOLN    Sig: Take 3 mLs by nebulization every 2 (two) hours as needed (wheeze, SOB).    Dispense:  60 mL    Refill:  3    Patient Instructions  Prescription written for nebulizer machine, nebulizer medication, blood pressure machine, antinausea medications.   For dry mouth, can try the following to increase salivation: Cough drops or hard candies, preferably sugar-free.  Any kind of citrus flavor will increase salivation.  Recommend keep follow-up appt with pulmonology, if they are not too concerned about serious respiratory disease we can probably manage medications here, depending on their findings.  Let's plan to keep currently scheduled follow-up with me to  recheck A1c/diabetes.  Please let me know sooner if there is anything you need from me!       Visit summary with medication list and pertinent instructions was printed for patient to review. All questions at time of visit were answered - patient instructed to contact office with any additional concerns or updates. ER/RTC precautions were reviewed with the patient.   Note: Total time spent 40 minutes, greater than 50% of the visit was spent face-to-face counseling and coordinating care for the above diagnoses listed in assessment/plan.   Please note: voice recognition software was used to produce this document, and typos may escape review. Please contact Dr. Lyn Hollingshead for any needed clarifications.     Follow-up plan: Return for keep currentlyscheduled appt - sooner if needed .

## 2018-11-26 NOTE — Patient Instructions (Addendum)
Prescription written for nebulizer machine, nebulizer medication, blood pressure machine, antinausea medications.   For dry mouth, can try the following to increase salivation: Cough drops or hard candies, preferably sugar-free.  Any kind of citrus flavor will increase salivation.  Recommend keep follow-up appt with pulmonology, if they are not too concerned about serious respiratory disease we can probably manage medications here, depending on their findings.  Let's plan to keep currently scheduled follow-up with me to recheck A1c/diabetes.  Please let me know sooner if there is anything you need from me!

## 2018-11-30 ENCOUNTER — Institutional Professional Consult (permissible substitution): Payer: Medicare Other | Admitting: Pulmonary Disease

## 2018-12-01 ENCOUNTER — Telehealth: Payer: Self-pay

## 2018-12-01 ENCOUNTER — Encounter: Payer: Self-pay | Admitting: Cardiology

## 2018-12-01 ENCOUNTER — Ambulatory Visit (INDEPENDENT_AMBULATORY_CARE_PROVIDER_SITE_OTHER): Payer: Medicare Other | Admitting: Cardiology

## 2018-12-01 VITALS — BP 167/88 | HR 88 | Ht 61.0 in | Wt 209.1 lb

## 2018-12-01 DIAGNOSIS — I255 Ischemic cardiomyopathy: Secondary | ICD-10-CM | POA: Diagnosis not present

## 2018-12-01 DIAGNOSIS — E7800 Pure hypercholesterolemia, unspecified: Secondary | ICD-10-CM

## 2018-12-01 DIAGNOSIS — I2510 Atherosclerotic heart disease of native coronary artery without angina pectoris: Secondary | ICD-10-CM | POA: Diagnosis not present

## 2018-12-01 DIAGNOSIS — I2699 Other pulmonary embolism without acute cor pulmonale: Secondary | ICD-10-CM

## 2018-12-01 DIAGNOSIS — I10 Essential (primary) hypertension: Secondary | ICD-10-CM

## 2018-12-01 MED ORDER — RIVAROXABAN 20 MG PO TABS: 20.0000 mg | ORAL_TABLET | Freq: Every day | ORAL | 6 refills | Status: DC

## 2018-12-01 MED ORDER — LOSARTAN POTASSIUM 50 MG PO TABS: 50.0000 mg | ORAL_TABLET | Freq: Every day | ORAL | 3 refills | Status: DC

## 2018-12-01 NOTE — Telephone Encounter (Signed)
[facility] advised.

## 2018-12-01 NOTE — Telephone Encounter (Signed)
What ever they need is fine, if any paperwork needs to sign can fax it to me and I will take care of it.

## 2018-12-01 NOTE — Telephone Encounter (Signed)
Baird Lyons with [facility] called to get verbal orders for Home Health Nurse. Please advise.

## 2018-12-01 NOTE — Patient Instructions (Signed)
Medication Instructions:   STOP ASPIRIN  TAKE THE LAST DOSE OF XARELTO 15 MG ON 12-08-2018 AND THEN START XARELTO 20 MG ONCE DAILY  START LOSARTAN 50 MG ONCE DAILY  Labwork:  Your physician recommends that you HAVE LAB WORK IN 1 WEEK  Follow-Up:  Your physician wants you to follow-up in: 6 MONTHS WITH DR Jens Som You will receive a reminder letter in the mail two months in advance. If you don't receive a letter, please call our office to schedule the follow-up appointment.  CALL IN June TO SCHEDULE APPOINTMENT IN [place]

## 2018-12-06 ENCOUNTER — Other Ambulatory Visit: Payer: Self-pay | Admitting: Cardiology

## 2018-12-06 MED ORDER — NITROGLYCERIN 0.4 MG SL SUBL: 0.4000 mg | SUBLINGUAL_TABLET | SUBLINGUAL | 3 refills | Status: AC | PRN

## 2018-12-06 NOTE — Telephone Encounter (Signed)
Rx(s) sent to pharmacy electronically.  

## 2018-12-06 NOTE — Telephone Encounter (Signed)
New Message    *STAT* If patient is at the pharmacy, call can be transferred to refill team.   1. Which medications need to be refilled? (please list name of each medication and dose if known) nitroglycerin   2. Which pharmacy/location (including street and city if local pharmacy) is medication to be sent to? [id number] - [place], [place] - [address]  3. Do they need a 30 day or 90 day supply? 30

## 2018-12-08 ENCOUNTER — Institutional Professional Consult (permissible substitution): Payer: Medicare Other | Admitting: Pulmonary Disease

## 2018-12-09 ENCOUNTER — Institutional Professional Consult (permissible substitution): Payer: Medicare Other | Admitting: Pulmonary Disease

## 2018-12-22 ENCOUNTER — Institutional Professional Consult (permissible substitution): Payer: Medicare Other | Admitting: Pulmonary Disease

## 2018-12-22 ENCOUNTER — Encounter: Payer: Self-pay | Admitting: *Deleted

## 2018-12-23 ENCOUNTER — Ambulatory Visit: Payer: Medicare Other | Admitting: Osteopathic Medicine

## 2018-12-24 ENCOUNTER — Institutional Professional Consult (permissible substitution): Payer: Medicare Other | Admitting: Pulmonary Disease

## 2018-12-29 ENCOUNTER — Ambulatory Visit: Payer: Medicare Other | Admitting: Osteopathic Medicine

## 2018-12-31 ENCOUNTER — Institutional Professional Consult (permissible substitution): Payer: Medicare Other | Admitting: Pulmonary Disease

## 2019-01-05 ENCOUNTER — Ambulatory Visit (INDEPENDENT_AMBULATORY_CARE_PROVIDER_SITE_OTHER): Payer: Medicare Other | Admitting: Osteopathic Medicine

## 2019-01-05 ENCOUNTER — Encounter: Payer: Self-pay | Admitting: Osteopathic Medicine

## 2019-01-05 ENCOUNTER — Other Ambulatory Visit: Payer: Self-pay

## 2019-01-05 VITALS — BP 115/74 | HR 88 | Temp 97.6°F | Wt 214.5 lb

## 2019-01-05 DIAGNOSIS — E1165 Type 2 diabetes mellitus with hyperglycemia: Secondary | ICD-10-CM

## 2019-01-05 DIAGNOSIS — Z794 Long term (current) use of insulin: Secondary | ICD-10-CM

## 2019-01-05 DIAGNOSIS — M545 Low back pain, unspecified: Secondary | ICD-10-CM

## 2019-01-05 DIAGNOSIS — J449 Chronic obstructive pulmonary disease, unspecified: Secondary | ICD-10-CM | POA: Diagnosis not present

## 2019-01-05 DIAGNOSIS — G8929 Other chronic pain: Secondary | ICD-10-CM

## 2019-01-05 NOTE — Patient Instructions (Addendum)
Plan:  Call me w/ name of anti-tremor medication (Cogentin?)  Please also confirm which cholesterol medicine you are taking (Atorvastatin or Rosuvastatin?)   Will refer to physical therapy

## 2019-01-05 NOTE — Progress Notes (Signed)
HPI: Valerie Keller is a 73 y.o. female who  has a past medical history of Diabetes mellitus without complication (HCC), Fibromyalgia, High blood pressure, High cholesterol, Lung disease, Myocardial infarction (HCC), Nephrolithiasis, Pulmonary embolus (HCC), and Renal disorder.  she presents to [facility] today, 01/05/19,  for chief complaint of: DM2 follow-up    Hospital discharge summary: Admitted 11/15/2018, discharged 11/18/2018 Presented with several days shortness of breath, acute worsening day of presentation.  For pneumonia, CTA demonstrated PE, in addition to Xarelto and discharged on this, continued azithromycin on discharge, arrange for pulmonology follow-up for likely COPD, prednisone taper on discharge. A1C in the hospital was 7.2 on 11/15/2018.  Should be on Xarelto 20 mg daily at this point.   Cardiology visit 12/01/2018: discontinued aspirin due to addition of Xarelto.  Continue 6 months of therapy with Xarelto.  History of CAD, ischemic cardiomyopathy with mildly reduced LV function.  Continuing beta-blocker, losartan 50 mg added.  Psychiatry visit 12/29/2018: Prescriptions for Abilify 10 mg, Klonopin 2 mg twice daily,        DIABETES SCREENING/PREVENTIVE CARE: A1C past 3-6 mos: Yes  controlled? yes  Meds: Lantus 34 Units qhs, Humalog 7 units tid ac, Metformin 850 mg bid   11/15/2018: 7.2% (in hospital)   01/05/19 today:   BP goal <130/80: Yes   BP Readings from Last 3 Encounters:  01/05/19 115/74  12/01/18 (!) 167/88  11/26/18 137/85   LDL goal <70: need labs, not fasting Eye exam annually: none on file, importance discussed with patient Foot exam: No  Microalbuminuria:No  Metformin: Yes  ACE/ARB: Yes  Antiplatelet if ASCVD Risk >10%: n/a on Xarelto for PE Statin: Yes  Pneumovax: Yes   Immunization History  Administered Date(s) Administered  . Influenza Split 07/20/2012, 06/20/2013, 12/16/2015  . Influenza, High Dose  Seasonal PF 07/27/2013, 07/27/2013, 09/29/2014, 09/29/2014, 08/01/2016, 08/01/2016, 07/21/2017, 07/21/2017, 06/24/2018  . Influenza-Unspecified 07/27/2013, 09/29/2014, 08/01/2016, 07/21/2017  . Pneumococcal Conjugate-13 09/29/2014  . Pneumococcal Polysaccharide-23 12/05/2011, 07/20/2012  . Pneumococcal-Unspecified 12/05/2011, 07/20/2012      Past medical, surgical, social and family history reviewed:  Patient Active Problem List   Diagnosis Date Noted  . Acute on chronic respiratory failure with hypoxia (HCC) 11/15/2018  . COPD with acute exacerbation (HCC) 11/15/2018  . Hypercapnia 11/15/2018  . Community acquired pneumonia of left lung   . Class 3 severe obesity due to excess calories in adult (HCC) 10/07/2018  . Hypokalemia 06/25/2018  . Inguinal hernia 06/25/2018  . Coronary artery disease with history of myocardial infarction without history of CABG 05/14/2018  . S/P coronary artery stent placement 05/14/2018  . Female pelvic pain 05/11/2018  . Type 2 diabetes mellitus with hyperglycemia (HCC) 04/12/2018  . Atrophic vaginitis 04/15/2017  . Angina pectoris (HCC) 02/10/2017  . Hyponatremia 02/10/2017  . Vitamin D deficiency 09/01/2016  . Fibromyalgia 08/01/2016  . Anxiety 01/04/2016  . Candidal skin infection 01/04/2016  . Chronic obstructive pulmonary disease, unspecified (HCC) 01/04/2016  . Hyperlipidemia, unspecified 01/04/2016  . Fatigue 06/12/2015  . Morbid obesity with alveolar hypoventilation (HCC) 11/03/2014  . Cardiomyopathy, ischemic 05/16/2014  . Obstructive sleep apnea (adult) (pediatric) 02/18/2013  . Left ventricular diastolic dysfunction 10/06/2012  . Hypomagnesemia 09/09/2012  . Chronic kidney disease, stage 3 (moderate) (HCC) 08/20/2012  . Depression with anxiety 08/20/2012  . Essential hypertension 08/20/2012  . Gastro-esophageal reflux disease without esophagitis 08/20/2012  . Calculus of kidney 08/20/2012    Past Surgical History:  Procedure  Laterality Date  .  ABDOMINAL HYSTERECTOMY    . CHOLECYSTECTOMY    . CORONARY STENT PLACEMENT     x3  . FOOT SURGERY    . KIDNEY SURGERY    . TONSILLECTOMY      Social History   Tobacco Use  . Smoking status: Former Games developer  . Smokeless tobacco: Never Used  Substance Use Topics  . Alcohol use: Never    Frequency: Never    Family History  Problem Relation Age of Onset  . CAD Brother   . Diabetes Neg Hx   . Cancer Neg Hx      Current medication list and allergy/intolerance information reviewed:    Current Outpatient Medications  Medication Sig Dispense Refill  . AMBULATORY NON FORMULARY MEDICATION Blood pressure monitor, around upper arm, not wrist.  Per patient preference and insurance coverage. 1 Units prn  . AMBULATORY NON FORMULARY MEDICATION Nebulizer device miscellaneous: Please include machine, necessary tubing and masks.  Dispensed per patient preference and insurance coverage.  Diagnosis COPD, pneumonia. 1 Units prn  . ARIPiprazole (ABILIFY) 10 MG tablet Take 10 mg by mouth daily.    Marland Kitchen atorvastatin (LIPITOR) 40 MG tablet Take 1 tablet (40 mg total) by mouth at bedtime. 90 tablet 3  . benztropine (COGENTIN) 0.5 MG tablet Take 0.5 mg by mouth 2 (two) times daily.    . clonazepam (KLONOPIN) 2 MG disintegrating tablet Take 2 mg by mouth 3 (three) times daily as needed (anxiety).     . DULoxetine (CYMBALTA) 60 MG capsule Take 120 mg by mouth daily.     . fluticasone (FLONASE ALLERGY RELIEF) 50 MCG/ACT nasal spray Place 2 sprays into the nose daily as needed for allergies.    . furosemide (LASIX) 40 MG tablet Take 2 tablets (80 mg total) by mouth daily. (Patient taking differently: Take 40 mg by mouth 2 (two) times daily. ) 180 tablet 3  . HUMALOG KWIKPEN 100 UNIT/ML KwikPen Inject 7 Units into the skin 3 (three) times daily before meals.    . Insulin Pen Needle (PEN NEEDLES) 31G X 8 MM MISC For use with insulin pen qid as directed, please dispense per patient preference and  insurance coverage 200 each 99  . Ipratropium-Albuterol (COMBIVENT RESPIMAT) 20-100 MCG/ACT AERS respimat Inhale 1 puff into the lungs every 6 (six) hours as needed for wheezing. 16 g 3  . ipratropium-albuterol (DUONEB) 0.5-2.5 (3) MG/3ML SOLN Take 3 mLs by nebulization every 2 (two) hours as needed (wheeze, SOB). 60 mL 3  . LANTUS SOLOSTAR 100 UNIT/ML Solostar Pen Inject 34 Units into the skin at bedtime. 15 mL 0  . losartan (COZAAR) 50 MG tablet Take 1 tablet (50 mg total) by mouth daily. 90 tablet 3  . Magnesium Oxide 400 (240 Mg) MG TABS Take 1-2 tablets by mouth See admin instructions. 1 tablet in the AM and 2 tablets in the evening    . metFORMIN (GLUCOPHAGE) 850 MG tablet Take 0.5 tablets (425 mg total) by mouth 2 (two) times daily. 180 tablet 3  . Multiple Vitamin (MULTIVITAMIN) capsule Take 1 capsule by mouth daily.    . nitroGLYCERIN (NITROSTAT) 0.4 MG SL tablet Place 1 tablet (0.4 mg total) under the tongue every 5 (five) minutes as needed for chest pain. 25 tablet 3  . ondansetron (ZOFRAN-ODT) 8 MG disintegrating tablet Take 1 tablet (8 mg total) by mouth every 8 (eight) hours as needed for nausea. 60 tablet 3  . potassium chloride SA (K-DUR,KLOR-CON) 20 MEQ tablet Take 20 mEq by mouth  daily.    . rivaroxaban (XARELTO) 20 MG TABS tablet Take 1 tablet (20 mg total) by mouth daily with supper for 30 days. 30 tablet 6  . rosuvastatin (CRESTOR) 40 MG tablet TAKE ONE TABLET (40 MG DOSE) BY MOUTH DAILY.    Marland Kitchen spironolactone (ALDACTONE) 25 MG tablet Take 1 tablet (25 mg total) by mouth daily. 90 tablet 1  . Vitamin D, Ergocalciferol, (DRISDOL) 1.25 MG (50000 UT) CAPS capsule Take 1 capsule (50,000 Units total) by mouth every 14 (fourteen) days. 12 capsule 0  . albuterol (PROVENTIL) (2.5 MG/3ML) 0.083% nebulizer solution Take 3 mLs by nebulization every 4 (four) hours as needed for shortness of breath. 75 mL 99  . metoprolol succinate (TOPROL-XL) 25 MG 24 hr tablet Take 0.5 tablets (12.5 mg total)  by mouth daily for 30 days. 15 tablet 0  . Rivaroxaban (XARELTO) 15 MG TABS tablet Take 1 tablet (15 mg total) by mouth 2 (two) times daily with a meal for 19 days. 38 tablet 0  . rOPINIRole (REQUIP) 2 MG tablet Take 6 mg by mouth at bedtime.      No current facility-administered medications for this visit.     Allergies  Allergen Reactions  . Morphine Itching and Rash  . Ciprofloxacin Other (See Comments)    AMS  . Latex Hives and Other (See Comments)    Other reaction(s): Other (See Comments)  . Promethazine Other (See Comments)  . Ibuprofen   . Penicillins Hives    Did it involve swelling of the face/tongue/throat, SOB, or low BP? No Did it involve sudden or severe rash/hives, skin peeling, or any reaction on the inside of your mouth or nose? No Did you need to seek medical attention at a hospital or doctor's office? No When did it last happen? If all above answers are "NO", may proceed with cephalosporin use.       Review of Systems:  Constitutional:  No  fever, no chills, No recent illness, No unintentional weight changes. No significant fatigue.   HEENT: No  headache, no vision change, no hearing change, No sore throat, No  sinus pressure  Cardiac: No  chest pain, No  pressure, No palpitations, No  Orthopnea  Respiratory:  No  shortness of breath. No  Cough  Gastrointestinal: No  abdominal pain, No  nausea, No  vomiting,  No  blood in stool, No  diarrhea, No  constipation   Musculoskeletal: No new myalgia/arthralgia  Skin: No  Rash, No other wounds/concerning lesions  Genitourinary: No  incontinence, No  abnormal genital bleeding, No abnormal genital discharge  Hem/Onc: No  easy bruising/bleeding, No  abnormal lymph node  Endocrine: No cold intolerance,  No heat intolerance. No polyuria/polydipsia/polyphagia   Neurologic: No  weakness, No  dizziness, No  slurred speech/focal weakness/facial droop  Psychiatric: No  concerns with depression, No  concerns with  anxiety, No sleep problems, No mood problems  Exam:  BP 115/74 (BP Location: Left Arm, Patient Position: Sitting, Cuff Size: Normal)   Pulse 88   Temp 97.6 F (36.4 C) (Oral)   Wt 214 lb 8 oz (97.3 kg)   BMI 40.53 kg/m   Constitutional: VS see above. General Appearance: alert, well-developed, well-nourished, NAD  Eyes: Normal lids and conjunctive, non-icteric sclera  Ears, Nose, Mouth, Throat: MMM, Normal external inspection ears/nares/mouth/lips/gums. TM normal bilaterally. Pharynx/tonsils no erythema, no exudate. Nasal mucosa normal.   Neck: No masses, trachea midline. No thyroid enlargement. No tenderness/mass appreciated. No lymphadenopathy  Respiratory: Normal  respiratory effort. no wheeze, no rhonchi, no rales  Cardiovascular: S1/S2 normal, no murmur, no rub/gallop auscultated. RRR. No lower extremity edema. Pedal pulse II/IV bilaterally DP and PT. No carotid bruit or JVD. No abdominal aortic bruit.  Gastrointestinal: Nontender, no masses. No hepatomegaly, no splenomegaly. No hernia appreciated. Bowel sounds normal. Rectal exam deferred.   Musculoskeletal: Gait normal. No clubbing/cyanosis of digits.   Neurological: Normal balance/coordination. No tremor. No cranial nerve deficit on limited exam. Motor and sensation intact and symmetric. Cerebellar reflexes intact.   Skin: warm, dry, intact. No rash/ulcer. No concerning nevi or subq nodules on limited exam.    Psychiatric: Normal judgment/insight. Normal mood and affect. Oriented x3.    No results found for this or any previous visit (from the past 72 hour(s)).  No results found.       ASSESSMENT/PLAN: The primary encounter diagnosis was Type 2 diabetes mellitus with hyperglycemia, with long-term current use of insulin (HCC). Diagnoses of Chronic obstructive pulmonary disease, unspecified COPD type (HCC) and Chronic bilateral low back pain without sciatica were also pertinent to this visit.   Orders Placed This  Encounter  Procedures  . Ambulatory referral to Physical Therapy   Pt to call with medication clarifications. Doesn't think she's on Cogentin, thinks it's bentro-something? (Benztropine is cogentin so that sounds right.) This is on the list. She reports she is taking atrovastatin, not rosuvastatin.   Patient Instructions  Plan:  Call me w/ name of anti-tremor medication (Cogentin?)  Please also confirm which cholesterol medicine you are taking (Atorvastatin or Rosuvastatin?)   Will refer to physical therapy         Visit summary with medication list and pertinent instructions was printed for patient to review. All questions at time of visit were answered - patient instructed to contact office with any additional concerns or updates. ER/RTC precautions were reviewed with the patient.     Please note: voice recognition software was used to produce this document, and typos may escape review. Please contact Dr. Lyn Hollingshead for any needed clarifications.     Follow-up plan: Return in about 4 months (around 05/07/2019) for recheck A1C, sooner if needed .

## 2019-01-07 ENCOUNTER — Telehealth: Payer: Self-pay | Admitting: Physical Therapy

## 2019-01-07 NOTE — Telephone Encounter (Signed)
Spoke with pt to reschedule evaluation due to clinic closing.  Pt requested to reschedule and will follow up at that time.  Declined e-visit or phone call follow up for now.  Clarita Crane, PT, DPT 01/07/19 8:02 PM

## 2019-01-12 ENCOUNTER — Ambulatory Visit: Payer: Medicare Other | Admitting: Physical Therapy

## 2019-01-17 ENCOUNTER — Other Ambulatory Visit: Payer: Self-pay | Admitting: Osteopathic Medicine

## 2019-01-17 MED ORDER — INSULIN GLARGINE 100 UNIT/ML SOLOSTAR PEN: 34.0000 [IU] | PEN_INJECTOR | Freq: Every day | SUBCUTANEOUS | 1 refills | Status: DC

## 2019-01-19 ENCOUNTER — Institutional Professional Consult (permissible substitution): Payer: Medicare Other | Admitting: Pulmonary Disease

## 2019-01-24 ENCOUNTER — Ambulatory Visit: Payer: Self-pay | Admitting: Physical Therapy

## 2019-01-24 ENCOUNTER — Ambulatory Visit: Payer: Medicare Other | Admitting: Rehabilitative and Restorative Service Providers"

## 2019-01-27 ENCOUNTER — Ambulatory Visit (INDEPENDENT_AMBULATORY_CARE_PROVIDER_SITE_OTHER): Payer: Medicare Other

## 2019-01-27 ENCOUNTER — Other Ambulatory Visit: Payer: Self-pay

## 2019-01-27 ENCOUNTER — Ambulatory Visit (INDEPENDENT_AMBULATORY_CARE_PROVIDER_SITE_OTHER): Payer: Medicare Other | Admitting: Osteopathic Medicine

## 2019-01-27 ENCOUNTER — Encounter: Payer: Self-pay | Admitting: Osteopathic Medicine

## 2019-01-27 VITALS — BP 124/58 | HR 100 | Temp 98.0°F | Wt 215.2 lb

## 2019-01-27 DIAGNOSIS — E860 Dehydration: Secondary | ICD-10-CM

## 2019-01-27 DIAGNOSIS — J9811 Atelectasis: Secondary | ICD-10-CM

## 2019-01-27 DIAGNOSIS — J069 Acute upper respiratory infection, unspecified: Secondary | ICD-10-CM

## 2019-01-27 DIAGNOSIS — R05 Cough: Secondary | ICD-10-CM | POA: Diagnosis not present

## 2019-01-27 DIAGNOSIS — R059 Cough, unspecified: Secondary | ICD-10-CM

## 2019-01-27 MED ORDER — DIPHENOXYLATE-ATROPINE 2.5-0.025 MG PO TABS: ORAL_TABLET | ORAL | 0 refills | Status: AC

## 2019-01-27 MED ORDER — PSEUDOEPH-BROMPHEN-DM 30-2-10 MG/5ML PO SYRP: 2.5000 mL | ORAL_SOLUTION | Freq: Four times a day (QID) | ORAL | 0 refills | Status: DC | PRN

## 2019-01-27 NOTE — Progress Notes (Signed)
HPI: Valerie Keller is a 73 y.o. female who  has a past medical history of Diabetes mellitus without complication (HCC), Fibromyalgia, High blood pressure, High cholesterol, Lung disease, Myocardial infarction (HCC), Nephrolithiasis, Pulmonary embolus (HCC), and Renal disorder.  she presents to [facility] today, 01/27/19,  for chief complaint of: Feeling sick   I connected with  Valerie Keller on 01/27/19 at 9:15 by a telemedicine application and verified that I am speaking with the correct person using two identifiers. I discussed the limitations of evaluation and management by telemedicine and the availability of in person appointments. The patient expressed understanding and agreed to proceed.  History of Present Illness: Valerie Keller is a 73 y.o. female who would like to discuss illness - cold like symptoms  . Quality: cough, sinus congestion w/ greenish mucus production, doesn't feel subjective fever but unable to measure temperature, has some chills. Doesn't feel like SOB. Has had diarrhea for a bit as well.  . Severity: seems to be getting worse.  . Duration: 2 weeks total, diarrhea for 2 days w/ 3 loose stools today so far.  . Modifying factors: has been taking Tylenol and used her nebulizer a few times  . Assoc signs/symptoms: headache, fatigue, dizziness/lightheadedness    Home VS: BP (!) 110/54 (Patient Position: Sitting, Cuff Size: Normal)   Pulse 86   Wt 216 lb (98 kg)   BMI 40.81 kg/m  BP Readings from Last 3 Encounters:  01/27/19 (!) 110/54  01/05/19 115/74  12/01/18 (!) 167/88    Given concerning low blood pressure, worsening illness, persistent cough, I decided to have the patient come to the office for a physical exam evaluation.  I do not have a strong suspicion for coronavirus/COVID-19 illness.  I am hopeful that we can keep the patient out of the emergency room, but I need to determine if she has a pneumonia that would  require antibiotics, if she truly has low blood pressure that might benefit from receiving IV fluids.  Patient agrees to come to the med center at 1:30 today for labs and x-ray and at 2:00 for appointment with me upstairs      Past medical, surgical, social and family history reviewed and updated as necessary.   Current medication list and allergy/intolerance information reviewed:    Current Outpatient Medications  Medication Sig Dispense Refill  . AMBULATORY NON FORMULARY MEDICATION Blood pressure monitor, around upper arm, not wrist.  Per patient preference and insurance coverage. 1 Units prn  . AMBULATORY NON FORMULARY MEDICATION Nebulizer device miscellaneous: Please include machine, necessary tubing and masks.  Dispensed per patient preference and insurance coverage.  Diagnosis COPD, pneumonia. 1 Units prn  . ARIPiprazole (ABILIFY) 10 MG tablet Take 10 mg by mouth daily.    Marland Kitchen atorvastatin (LIPITOR) 40 MG tablet Take 1 tablet (40 mg total) by mouth at bedtime. 90 tablet 3  . benztropine (COGENTIN) 0.5 MG tablet Take 0.5 mg by mouth 2 (two) times daily.    . clonazepam (KLONOPIN) 2 MG disintegrating tablet Take 2 mg by mouth 3 (three) times daily as needed (anxiety).     . DULoxetine (CYMBALTA) 60 MG capsule Take 120 mg by mouth daily.     . furosemide (LASIX) 40 MG tablet Take 2 tablets (80 mg total) by mouth daily. (Patient taking differently: Take 40 mg by mouth 2 (two) times daily. ) 180 tablet 3  . HUMALOG KWIKPEN 100 UNIT/ML KwikPen Inject 7 Units into the  skin 3 (three) times daily before meals.    . Insulin Glargine (LANTUS SOLOSTAR) 100 UNIT/ML Solostar Pen Inject 34 Units into the skin at bedtime. 15 mL 1  . Insulin Pen Needle (PEN NEEDLES) 31G X 8 MM MISC For use with insulin pen qid as directed, please dispense per patient preference and insurance coverage 200 each 99  . ipratropium-albuterol (DUONEB) 0.5-2.5 (3) MG/3ML SOLN Take 3 mLs by nebulization every 2 (two) hours as  needed (wheeze, SOB). 60 mL 3  . losartan (COZAAR) 50 MG tablet Take 1 tablet (50 mg total) by mouth daily. 90 tablet 3  . Magnesium Oxide 400 (240 Mg) MG TABS Take 1-2 tablets by mouth See admin instructions. 1 tablet in the AM and 2 tablets in the evening    . metFORMIN (GLUCOPHAGE) 850 MG tablet Take 0.5 tablets (425 mg total) by mouth 2 (two) times daily. 180 tablet 3  . Multiple Vitamin (MULTIVITAMIN) capsule Take 1 capsule by mouth daily.    . nitroGLYCERIN (NITROSTAT) 0.4 MG SL tablet Place 1 tablet (0.4 mg total) under the tongue every 5 (five) minutes as needed for chest pain. 25 tablet 3  . ondansetron (ZOFRAN-ODT) 8 MG disintegrating tablet Take 1 tablet (8 mg total) by mouth every 8 (eight) hours as needed for nausea. 60 tablet 3  . potassium chloride SA (K-DUR,KLOR-CON) 20 MEQ tablet Take 20 mEq by mouth daily.    . rivaroxaban (XARELTO) 20 MG TABS tablet Take 1 tablet (20 mg total) by mouth daily with supper for 30 days. 30 tablet 6  . rOPINIRole (REQUIP) 2 MG tablet Take 6 mg by mouth at bedtime.     Marland Kitchen. spironolactone (ALDACTONE) 25 MG tablet Take 1 tablet (25 mg total) by mouth daily. 90 tablet 1  . Vitamin D, Ergocalciferol, (DRISDOL) 1.25 MG (50000 UT) CAPS capsule Take 1 capsule (50,000 Units total) by mouth every 14 (fourteen) days. 12 capsule 0  . albuterol (PROVENTIL) (2.5 MG/3ML) 0.083% nebulizer solution Take 3 mLs by nebulization every 4 (four) hours as needed for shortness of breath. 75 mL 99  . fluticasone (FLONASE ALLERGY RELIEF) 50 MCG/ACT nasal spray Place 2 sprays into the nose daily as needed for allergies.    . Ipratropium-Albuterol (COMBIVENT RESPIMAT) 20-100 MCG/ACT AERS respimat Inhale 1 puff into the lungs every 6 (six) hours as needed for wheezing. (Patient not taking: Reported on 01/27/2019) 16 g 3  . metoprolol succinate (TOPROL-XL) 25 MG 24 hr tablet Take 0.5 tablets (12.5 mg total) by mouth daily for 30 days. (Patient not taking: Reported on 01/27/2019) 15 tablet 0    No current facility-administered medications for this visit.     Allergies  Allergen Reactions  . Morphine Itching and Rash  . Ciprofloxacin Other (See Comments)    AMS  . Latex Hives and Other (See Comments)    Other reaction(s): Other (See Comments)  . Promethazine Other (See Comments)  . Ibuprofen   . Penicillins Hives    Did it involve swelling of the face/tongue/throat, SOB, or low BP? No Did it involve sudden or severe rash/hives, skin peeling, or any reaction on the inside of your mouth or nose? No Did you need to seek medical attention at a hospital or doctor's office? No When did it last happen? If all above answers are "NO", may proceed with cephalosporin use.       Review of Systems:  Constitutional:  No  fever, +chills, +recent illness, No unintentional weight changes. +significant fatigue.  HEENT: +headache, no vision change, no hearing change, No sore throat, +sinus pressure  Cardiac: No  chest pain, No  pressure, No palpitations  Respiratory:  No  shortness of breath. +Cough  Gastrointestinal: No  abdominal pain, No  nausea, No  vomiting,  No  blood in stool, +diarrhea  Musculoskeletal: No new myalgia/arthralgia  Skin: No  Rash  Neurologic: +generlized NON-focal weakness, +dizziness  Exam & VS in office BP (!) 124/58 (BP Location: Left Arm, Patient Position: Sitting, Cuff Size: Normal)   Pulse 100   Temp 98 F (36.7 C) (Oral)   Wt 215 lb 3.2 oz (97.6 kg)   SpO2 95%   BMI 40.66 kg/m   BP Readings from Last 3 Encounters:  01/27/19 (!) 124/58  01/05/19 115/74  12/01/18 (!) 167/88    Constitutional: VS see above. General Appearance: alert, well-developed, well-nourished, NAD  Eyes: Normal lids and conjunctive, non-icteric sclera  Ears, Nose, Mouth, Throat: MMM, Normal external inspection ears/nares/mouth/lips/gums. TM normal bilaterally w/ mild clear effusion bilaterally. Pharynx/tonsils no erythema, no exudate. Nasal mucosa normal. Mucus  membranes dry.   Neck: No masses, trachea midline. No tenderness/mass appreciated. No lymphadenopathy  Respiratory: Normal respiratory effort. no wheeze, no rhonchi, no rales  Cardiovascular: S1/S2 normal, no murmur, no rub/gallop auscultated. RRR. No lower extremity edema.   Neurological: Normal balance/coordination. No tremor.   Skin: warm, dry, intact.   Psychiatric: Normal judgment/insight. Normal mood and affect.    No results found for this or any previous visit (from the past 72 hour(s)).    CXR personally reviewed, no concerns. Radiology over-read also shows no pneumonia.   Dg Chest 2 View  Result Date: 01/27/2019 CLINICAL DATA:  Productive cough short of breath EXAM: CHEST - 2 VIEW COMPARISON:  Chest CT 11/16/2018, chest x-ray 11/15/2018 FINDINGS: Heart size and vascularity normal.  Left coronary stent. Mild linear atelectasis in the lung bases. Negative for pneumonia or effusion Moderately severe compression fracture T10 is unchanged. Moderate compression fracture T4 unchanged. IMPRESSION: Mild bibasilar atelectasis.  Negative for pneumonia. Electronically Signed   By: Marlan Palau M.D.   On: 01/27/2019 14:09            ASSESSMENT/PLAN: The primary encounter diagnosis was Cough. Diagnoses of Viral URI and Dehydration were also pertinent to this visit.   Discussed supportive care and symptomatic treatment.   Meds ordered this encounter  Medications  . diphenoxylate-atropine (LOMOTIL) 2.5-0.025 MG tablet    Sig: One to 2 tablets by mouth 4 times a day as needed for diarrhea.    Dispense:  10 tablet    Refill:  0  . brompheniramine-pseudoephedrine-DM 30-2-10 MG/5ML syrup    Sig: Take 2.5-5 mLs by mouth 4 (four) times daily as needed (cough, congestion).    Dispense:  473 mL    Refill:  0    Orders Placed This Encounter  Procedures  . DG Chest 2 View  . CBC with Differential/Platelet  . COMPLETE METABOLIC PANEL WITH GFR        Visit summary with  medication list and pertinent instructions was printed for patient to review. All questions at time of visit were answered - patient instructed to contact office with any additional concerns or updates. ER/RTC precautions were reviewed with the patient.   Follow-up plan: Return if symptoms worsen or fail to improve.  Note: Total time spent 40 minutes, greater than 50% of the visit was spent face-to-face counseling and coordinating care for the following: The primary encounter diagnosis was  Cough. Diagnoses of Viral URI and Dehydration were also pertinent to this visit.Marland Kitchen  Please note: voice recognition software was used to produce this document, and typos may escape review. Please contact Dr. Lyn Hollingshead for any needed clarifications.

## 2019-01-28 LAB — CBC WITH DIFFERENTIAL/PLATELET
Absolute Monocytes: 692 cells/uL (ref 200–950)
Basophils Absolute: 55 cells/uL (ref 0–200)
Basophils Relative: 0.6 %
Eosinophils Absolute: 218 cells/uL (ref 15–500)
Eosinophils Relative: 2.4 %
HCT: 45.9 % — ABNORMAL HIGH (ref 35.0–45.0)
Hemoglobin: 16.1 g/dL — ABNORMAL HIGH (ref 11.7–15.5)
Lymphs Abs: 2712 cells/uL (ref 850–3900)
MCH: 32.8 pg (ref 27.0–33.0)
MCHC: 35.1 g/dL (ref 32.0–36.0)
MCV: 93.5 fL (ref 80.0–100.0)
MPV: 10.6 fL (ref 7.5–12.5)
Monocytes Relative: 7.6 %
Neutro Abs: 5424 cells/uL (ref 1500–7800)
Neutrophils Relative %: 59.6 %
Platelets: 245 10*3/uL (ref 140–400)
RBC: 4.91 10*6/uL (ref 3.80–5.10)
RDW: 13.2 % (ref 11.0–15.0)
Total Lymphocyte: 29.8 %
WBC: 9.1 10*3/uL (ref 3.8–10.8)

## 2019-01-28 LAB — COMPLETE METABOLIC PANEL WITH GFR
AG Ratio: 1.6 (calc) (ref 1.0–2.5)
ALT: 25 U/L (ref 6–29)
AST: 22 U/L (ref 10–35)
Albumin: 4.1 g/dL (ref 3.6–5.1)
Alkaline phosphatase (APISO): 81 U/L (ref 37–153)
BUN/Creatinine Ratio: 16 (calc) (ref 6–22)
BUN: 23 mg/dL (ref 7–25)
CO2: 30 mmol/L (ref 20–32)
Calcium: 10.3 mg/dL (ref 8.6–10.4)
Chloride: 98 mmol/L (ref 98–110)
Creat: 1.42 mg/dL — ABNORMAL HIGH (ref 0.60–0.93)
GFR, Est African American: 42 mL/min/{1.73_m2} — ABNORMAL LOW (ref >=60)
GFR, Est Non African American: 37 mL/min/{1.73_m2} — ABNORMAL LOW (ref >=60)
Globulin: 2.6 g/dL (calc) (ref 1.9–3.7)
Glucose, Bld: 100 mg/dL — ABNORMAL HIGH (ref 65–99)
Potassium: 4 mmol/L (ref 3.5–5.3)
Sodium: 140 mmol/L (ref 135–146)
Total Bilirubin: 0.6 mg/dL (ref 0.2–1.2)
Total Protein: 6.7 g/dL (ref 6.1–8.1)

## 2019-02-28 ENCOUNTER — Other Ambulatory Visit: Payer: Self-pay

## 2019-02-28 MED ORDER — SPIRONOLACTONE 25 MG PO TABS: 25.0000 mg | ORAL_TABLET | Freq: Every day | ORAL | 1 refills | Status: DC

## 2019-03-02 ENCOUNTER — Telehealth: Payer: Self-pay

## 2019-03-02 DIAGNOSIS — H9203 Otalgia, bilateral: Secondary | ICD-10-CM

## 2019-03-02 NOTE — Telephone Encounter (Signed)
Referral order is in.

## 2019-03-02 NOTE — Telephone Encounter (Signed)
Pt's husband called requesting a referral for ENT specialist. Requesting [place] location. As per pt's husband, pt is having difficulties with her ears. Pls advise, thanks.

## 2019-03-02 NOTE — Telephone Encounter (Signed)
Pt's husband has been updated and is aware of referral process. No other inquiries during call.

## 2019-03-06 ENCOUNTER — Other Ambulatory Visit: Payer: Self-pay | Admitting: Osteopathic Medicine

## 2019-04-01 ENCOUNTER — Other Ambulatory Visit: Payer: Self-pay

## 2019-04-01 ENCOUNTER — Other Ambulatory Visit: Payer: Self-pay | Admitting: Sports Medicine

## 2019-04-01 ENCOUNTER — Encounter: Payer: Self-pay | Admitting: Osteopathic Medicine

## 2019-04-01 ENCOUNTER — Ambulatory Visit (INDEPENDENT_AMBULATORY_CARE_PROVIDER_SITE_OTHER): Payer: Medicare Other

## 2019-04-01 ENCOUNTER — Ambulatory Visit (INDEPENDENT_AMBULATORY_CARE_PROVIDER_SITE_OTHER): Payer: Medicare Other | Admitting: Osteopathic Medicine

## 2019-04-01 VITALS — BP 110/49 | HR 93 | Temp 98.2°F | Wt 218.7 lb

## 2019-04-01 DIAGNOSIS — M1712 Unilateral primary osteoarthritis, left knee: Secondary | ICD-10-CM | POA: Diagnosis not present

## 2019-04-01 DIAGNOSIS — R399 Unspecified symptoms and signs involving the genitourinary system: Secondary | ICD-10-CM

## 2019-04-01 DIAGNOSIS — E213 Hyperparathyroidism, unspecified: Secondary | ICD-10-CM

## 2019-04-01 DIAGNOSIS — N183 Chronic kidney disease, stage 3 unspecified: Secondary | ICD-10-CM

## 2019-04-01 DIAGNOSIS — F418 Other specified anxiety disorders: Secondary | ICD-10-CM

## 2019-04-01 DIAGNOSIS — E1165 Type 2 diabetes mellitus with hyperglycemia: Secondary | ICD-10-CM

## 2019-04-01 DIAGNOSIS — I255 Ischemic cardiomyopathy: Secondary | ICD-10-CM

## 2019-04-01 DIAGNOSIS — G894 Chronic pain syndrome: Secondary | ICD-10-CM

## 2019-04-01 DIAGNOSIS — M79605 Pain in left leg: Secondary | ICD-10-CM

## 2019-04-01 DIAGNOSIS — M25562 Pain in left knee: Secondary | ICD-10-CM | POA: Diagnosis not present

## 2019-04-01 DIAGNOSIS — R5383 Other fatigue: Secondary | ICD-10-CM

## 2019-04-01 DIAGNOSIS — M47816 Spondylosis without myelopathy or radiculopathy, lumbar region: Secondary | ICD-10-CM

## 2019-04-01 DIAGNOSIS — I959 Hypotension, unspecified: Secondary | ICD-10-CM

## 2019-04-01 DIAGNOSIS — M1711 Unilateral primary osteoarthritis, right knee: Secondary | ICD-10-CM | POA: Diagnosis not present

## 2019-04-01 DIAGNOSIS — G8929 Other chronic pain: Secondary | ICD-10-CM

## 2019-04-01 DIAGNOSIS — Z794 Long term (current) use of insulin: Secondary | ICD-10-CM

## 2019-04-01 LAB — POCT URINALYSIS DIPSTICK
Bilirubin, UA: NEGATIVE
Blood, UA: NEGATIVE
Glucose, UA: NEGATIVE
Ketones, UA: 1.02
Leukocytes, UA: NEGATIVE
Nitrite, UA: NEGATIVE
Protein, UA: NEGATIVE
Spec Grav, UA: 1.02 (ref 1.010–1.025)
Urobilinogen, UA: 0.2 E.U./dL
pH, UA: 8 (ref 5.0–8.0)

## 2019-04-01 MED ORDER — LOSARTAN POTASSIUM 50 MG PO TABS: 50.0000 mg | ORAL_TABLET | Freq: Every day | ORAL | 3 refills | Status: DC

## 2019-04-01 NOTE — Assessment & Plan Note (Signed)
Unguided knee x-ray as above. Rehab exercises given, x-rays. Return to see me in 1 month, if persistent discomfort we will consider her lumbar spine as the pain generator.

## 2019-04-01 NOTE — Assessment & Plan Note (Addendum)
It sounds as though she is getting set up with pain management. She does have an L4 compression fracture, from a year ago, with mild retropulsion at the L3-L4 level causing central canal stenosis. She has done well in the past with epidurals, we are to get x-rays in anticipation of an MRI. If persistent discomfort I will further evaluate her lumbar spine.

## 2019-04-01 NOTE — Patient Instructions (Addendum)
Plan:  Blood pressure is low, this may be due to medications but we need to check for other potential causes.  I would like you to hold your losartan for now, let's plan to see you next week to recheck blood pressure, I would like to get some blood work done in the meantime  Would strongly consider decreasing the dose of clonazepam/Klonopin, this certainly could be making you feel particularly tired especially with low blood pressure.  Would take half a tablet.   It certainly possible that the low blood pressure may be the cause of the fatigue.  I will needs more information though, before I can be sure.  If you start to feel worse, particularly if any passing out, would need you to go to the emergency room if over the weekend or after hours.  We will get you set up with home physical therapy and a pain management specialist.  For now, try to stay as active as you feel able to.

## 2019-04-01 NOTE — Progress Notes (Signed)
HPI: Valerie Keller is a 73 y.o. female who  has a past medical history of Diabetes mellitus without complication (HCC), Fibromyalgia, High blood pressure, High cholesterol, Lung disease, Myocardial infarction (HCC), Nephrolithiasis, Pulmonary embolus (HCC), and Renal disorder.  she presents to [facility] today, 04/01/19,  for chief complaint of: Fatigue   Patient reports feeling of fatigue for several months but seems to be worsening over the past couple of weeks.  States she just does not have any energy.  Reports left knee pain and leg pain.  Asks if she could see sports medicine today for injection.  Reports occasional lightheadedness, no vertigo, no chest pain, shortness of breath on exertion, has not noticed any dark black stool or blood in the stool other than what she typically has intermittently bright red blood from hemorrhoids.  Reports eating and drinking normally.  Requests referral for pain management Requests referral for a kidney specialist Requests referral for psychiatrist Requests referral for home health for physical therapy   Past medical, surgical, social and family history reviewed:  Patient Active Problem List   Diagnosis Date Noted  . Primary osteoarthritis of left knee 04/01/2019  . Lumbar spondylosis 04/01/2019  . Acute on chronic respiratory failure with hypoxia (HCC) 11/15/2018  . COPD with acute exacerbation (HCC) 11/15/2018  . Hypercapnia 11/15/2018  . Community acquired pneumonia of left lung   . Class 3 severe obesity due to excess calories in adult (HCC) 10/07/2018  . Hypokalemia 06/25/2018  . Inguinal hernia 06/25/2018  . Coronary artery disease with history of myocardial infarction without history of CABG 05/14/2018  . S/P coronary artery stent placement 05/14/2018  . Female pelvic pain 05/11/2018  . Type 2 diabetes mellitus with hyperglycemia (HCC) 04/12/2018  . Atrophic vaginitis 04/15/2017  . Angina pectoris  (HCC) 02/10/2017  . Hyponatremia 02/10/2017  . Vitamin D deficiency 09/01/2016  . Fibromyalgia 08/01/2016  . Anxiety 01/04/2016  . Candidal skin infection 01/04/2016  . Chronic obstructive pulmonary disease, unspecified (HCC) 01/04/2016  . Hyperlipidemia, unspecified 01/04/2016  . Fatigue 06/12/2015  . Morbid obesity with alveolar hypoventilation (HCC) 11/03/2014  . Cardiomyopathy, ischemic 05/16/2014  . Obstructive sleep apnea (adult) (pediatric) 02/18/2013  . Left ventricular diastolic dysfunction 10/06/2012  . Hypomagnesemia 09/09/2012  . Chronic kidney disease, stage 3 (moderate) (HCC) 08/20/2012  . Depression with anxiety 08/20/2012  . Essential hypertension 08/20/2012  . Gastro-esophageal reflux disease without esophagitis 08/20/2012  . Calculus of kidney 08/20/2012    Past Surgical History:  Procedure Laterality Date  . ABDOMINAL HYSTERECTOMY    . CHOLECYSTECTOMY    . CORONARY STENT PLACEMENT     x3  . FOOT SURGERY    . KIDNEY SURGERY    . TONSILLECTOMY      Social History   Tobacco Use  . Smoking status: Former Games developermoker  . Smokeless tobacco: Never Used  Substance Use Topics  . Alcohol use: Never    Frequency: Never    Family History  Problem Relation Age of Onset  . CAD Brother   . Diabetes Neg Hx   . Cancer Neg Hx      Current medication list and allergy/intolerance information reviewed:    Current Outpatient Medications  Medication Sig Dispense Refill  . AMBULATORY NON FORMULARY MEDICATION Blood pressure monitor, around upper arm, not wrist.  Per patient preference and insurance coverage. 1 Units prn  . AMBULATORY NON FORMULARY MEDICATION Nebulizer device miscellaneous: Please include machine, necessary tubing and masks.  Dispensed per  patient preference and insurance coverage.  Diagnosis COPD, pneumonia. 1 Units prn  . ARIPiprazole (ABILIFY) 10 MG tablet Take 10 mg by mouth daily.    Marland Kitchen. atorvastatin (LIPITOR) 40 MG tablet Take 1 tablet (40 mg total) by  mouth at bedtime. 90 tablet 3  . benztropine (COGENTIN) 0.5 MG tablet Take 0.5 mg by mouth 2 (two) times daily.    . brompheniramine-pseudoephedrine-DM 30-2-10 MG/5ML syrup Take 2.5-5 mLs by mouth 4 (four) times daily as needed (cough, congestion). 473 mL 0  . clonazepam (KLONOPIN) 2 MG disintegrating tablet Take 2 mg by mouth 3 (three) times daily as needed (anxiety).     Marland Kitchen. diphenoxylate-atropine (LOMOTIL) 2.5-0.025 MG tablet One to 2 tablets by mouth 4 times a day as needed for diarrhea. 10 tablet 0  . DULoxetine (CYMBALTA) 60 MG capsule Take 120 mg by mouth daily.     . fluticasone (FLONASE ALLERGY RELIEF) 50 MCG/ACT nasal spray Place 2 sprays into the nose daily as needed for allergies.    . furosemide (LASIX) 40 MG tablet Take 2 tablets (80 mg total) by mouth daily. (Patient taking differently: Take 40 mg by mouth 2 (two) times daily. ) 180 tablet 3  . HUMALOG KWIKPEN 100 UNIT/ML KwikPen Inject 7 Units into the skin 3 (three) times daily before meals.    . Insulin Pen Needle (PEN NEEDLES) 31G X 8 MM MISC For use with insulin pen qid as directed, please dispense per patient preference and insurance coverage 200 each 99  . Ipratropium-Albuterol (COMBIVENT RESPIMAT) 20-100 MCG/ACT AERS respimat Inhale 1 puff into the lungs every 6 (six) hours as needed for wheezing. 16 g 3  . ipratropium-albuterol (DUONEB) 0.5-2.5 (3) MG/3ML SOLN Take 3 mLs by nebulization every 2 (two) hours as needed (wheeze, SOB). 60 mL 3  . LANTUS SOLOSTAR 100 UNIT/ML Solostar Pen INJECT 34 UNITS INTO THE SKIN AT BEDTIME. 15 mL 3  . Magnesium Oxide 400 (240 Mg) MG TABS Take 1-2 tablets by mouth See admin instructions. 1 tablet in the AM and 2 tablets in the evening    . metFORMIN (GLUCOPHAGE) 850 MG tablet Take 0.5 tablets (425 mg total) by mouth 2 (two) times daily. 180 tablet 3  . Multiple Vitamin (MULTIVITAMIN) capsule Take 1 capsule by mouth daily.    . nitroGLYCERIN (NITROSTAT) 0.4 MG SL tablet Place 1 tablet (0.4 mg  total) under the tongue every 5 (five) minutes as needed for chest pain. 25 tablet 3  . ondansetron (ZOFRAN-ODT) 8 MG disintegrating tablet Take 1 tablet (8 mg total) by mouth every 8 (eight) hours as needed for nausea. 60 tablet 3  . potassium chloride SA (K-DUR,KLOR-CON) 20 MEQ tablet Take 20 mEq by mouth daily.    Marland Kitchen. spironolactone (ALDACTONE) 25 MG tablet Take 1 tablet (25 mg total) by mouth daily. 90 tablet 1  . Vitamin D, Ergocalciferol, (DRISDOL) 1.25 MG (50000 UT) CAPS capsule Take 1 capsule (50,000 Units total) by mouth every 14 (fourteen) days. 12 capsule 0  . albuterol (PROVENTIL) (2.5 MG/3ML) 0.083% nebulizer solution Take 3 mLs by nebulization every 4 (four) hours as needed for shortness of breath. 75 mL 99  . losartan (COZAAR) 50 MG tablet Take 1 tablet (50 mg total) by mouth daily. 90 tablet 3  . metoprolol succinate (TOPROL-XL) 25 MG 24 hr tablet Take 0.5 tablets (12.5 mg total) by mouth daily for 30 days. (Patient not taking: Reported on 01/27/2019) 15 tablet 0  . rivaroxaban (XARELTO) 20 MG TABS tablet Take 1  tablet (20 mg total) by mouth daily with supper for 30 days. 30 tablet 6  . rOPINIRole (REQUIP) 2 MG tablet Take 6 mg by mouth at bedtime.      No current facility-administered medications for this visit.     Allergies  Allergen Reactions  . Morphine Itching and Rash  . Ciprofloxacin Other (See Comments)    AMS  . Latex Hives and Other (See Comments)    Other reaction(s): Other (See Comments)  . Promethazine Other (See Comments)  . Ibuprofen   . Penicillins Hives    Did it involve swelling of the face/tongue/throat, SOB, or low BP? No Did it involve sudden or severe rash/hives, skin peeling, or any reaction on the inside of your mouth or nose? No Did you need to seek medical attention at a hospital or doctor's office? No When did it last happen? If all above answers are "NO", may proceed with cephalosporin use.       Review of Systems:  Constitutional:  No   fever, no chills, No recent illness, No unintentional weight changes. +significant fatigue.   HEENT: No  headache, no vision change  Cardiac: No  chest pain, No  pressure, No palpitations, No  Orthopnea   Respiratory:  +shortness of breath. No  Cough  Gastrointestinal: No  abdominal pain, No  nausea, No  vomiting,  No  blood in stool, No  diarrhea, No  constipation   Musculoskeletal: +new myalgia/arthralgia  Skin: No  Rash, No other wounds/concerning lesions  Hem/Onc: No  easy bruising/bleeding, No  abnormal lymph node  Neurologic: +generalized weakness, +lightheaded dizziness, No  slurred speech/focal weakness/facial droop  Psychiatric: +concerns with depression, +concerns with anxiety, +sleep problems, No mood problems  Exam:  BP (!) 110/49 (BP Location: Left Arm, Patient Position: Sitting, Cuff Size: Normal)   Pulse 93   Temp 98.2 F (36.8 C) (Oral)   Wt 218 lb 11.2 oz (99.2 kg)   BMI 41.32 kg/m   BP Readings from Last 3 Encounters:  04/01/19 (!) 110/49  01/27/19 (!) 124/58  01/05/19 115/74    Constitutional: VS see above. General Appearance: alert, well-developed, well-nourished, NAD  Eyes: Normal lids and conjunctive, non-icteric sclera  Neck: No masses, trachea midline. No thyroid enlargement. No tenderness/mass appreciated. No lymphadenopathy  Respiratory: Normal respiratory effort. no wheeze, no rhonchi, no rales  Cardiovascular: S1/S2 normal, no murmur, no rub/gallop auscultated. RRR. No lower extremity edema  Gastrointestinal: habitus limits exam   Neurological: Normal balance/coordination. No tremor.   Skin: warm, dry, intact. No rash/ulcer.  Psychiatric: Normal judgment/insight. Normal mood and affect. Oriented x3.    Results for orders placed or performed in visit on 04/01/19 (from the past 72 hour(s))  POCT Urinalysis Dipstick     Status: None   Collection Time: 04/01/19  9:59 AM  Result Value Ref Range   Color, UA Yellow    Clarity, UA Clear     Glucose, UA Negative Negative   Bilirubin, UA Negative    Ketones, UA 1.020    Spec Grav, UA 1.020 1.010 - 1.025   Blood, UA Negative    pH, UA 8.0 5.0 - 8.0   Protein, UA Negative Negative   Urobilinogen, UA 0.2 0.2 or 1.0 E.U./dL   Nitrite, UA Negative    Leukocytes, UA Negative Negative   Appearance     Odor      No results found.           ASSESSMENT/PLAN: The primary encounter diagnosis was  UTI symptoms. Diagnoses of Left leg pain, Chronic pain of left knee, Fatigue, unspecified type, Hypotension, unspecified hypotension type, Type 2 diabetes mellitus with hyperglycemia, with long-term current use of insulin (HCC), CKD (chronic kidney disease) stage 3, GFR 30-59 ml/min (HCC), Chronic pain syndrome, Primary osteoarthritis of left knee, Lumbar spondylosis, and Depression with anxiety were also pertinent to this visit.  Appreciate assistance from sports medicine, please see Dr. Melvia Heaps's note for full details.   Orders Placed This Encounter  Procedures  . Urine Culture  . DG Knee Complete 4 Views Left  . DG Lumbar Spine Complete  . Urinalysis, microscopic only  . CBC  . COMPLETE METABOLIC PANEL WITH GFR  . TSH  . T4, free  . Magnesium  . Hemoglobin A1c  . Ambulatory referral to Home Health  . Ambulatory referral to Nephrology  . Ambulatory referral to Pain Clinic  . Ambulatory referral to Psychiatry  . POCT Urinalysis Dipstick    No orders of the defined types were placed in this encounter.   Patient Instructions  Plan:  Blood pressure is low, this may be due to medications but we need to check for other potential causes.  I would like you to hold your losartan for now, let's plan to see you next week to recheck blood pressure, I would like to get some blood work done in the meantime  Would strongly consider decreasing the dose of clonazepam/Klonopin, this certainly could be making you feel particularly tired especially with low blood pressure.  Would take half  a tablet.   It certainly possible that the low blood pressure may be the cause of the fatigue.  I will needs more information though, before I can be sure.  If you start to feel worse, particularly if any passing out, would need you to go to the emergency room if over the weekend or after hours.  We will get you set up with home physical therapy and a pain management specialist.  For now, try to stay as active as you feel able to.         Visit summary with medication list and pertinent instructions was printed for patient to review. All questions at time of visit were answered - patient instructed to contact office with any additional concerns or updates. ER/RTC precautions were reviewed with the patient.      Please note: voice recognition software was used to produce this document, and typos may escape review. Please contact Dr. Lyn HollingsheadAlexander for any needed clarifications.     Follow-up plan: Return for Next week while Dr. Lyn HollingsheadAlexander is in the office, recheck blood pressure..Marland Kitchen

## 2019-04-01 NOTE — Progress Notes (Signed)
Subjective:    CC: Left knee pain  HPI: Valerie Keller is a pleasant 73 year old female with chronic left knee pain, knee osteoarthritis, she also has chronic low back pain, with compression fractures as well as multilevel spondylosis and spinal stenosis.  Pain is predominantly on the medial aspect of the left knee, worse with prolonged weightbearing, moderate, persistent, no mechanical symptoms, no trauma.  I am called for further evaluation and definitive treatment regards to interventional treatment of her knee pain.  I reviewed the past medical history, family history, social history, surgical history, and allergies today and no changes were needed.  Please see the problem list section below in epic for further details.  Past Medical History: Past Medical History:  Diagnosis Date  . Diabetes mellitus without complication (HCC)   . Fibromyalgia   . High blood pressure   . High cholesterol   . Lung disease   . Myocardial infarction (HCC)   . Nephrolithiasis   . Pulmonary embolus (HCC)   . Renal disorder    Past Surgical History: Past Surgical History:  Procedure Laterality Date  . ABDOMINAL HYSTERECTOMY    . CHOLECYSTECTOMY    . CORONARY STENT PLACEMENT     x3  . FOOT SURGERY    . KIDNEY SURGERY    . TONSILLECTOMY     Social History: Social History   Socioeconomic History  . Marital status: Married    Spouse name: Not on file  . Number of children: 1  . Years of education: Not on file  . Highest education level: Not on file  Occupational History  . Not on file  Social Needs  . Financial resource strain: Not on file  . Food insecurity    Worry: Not on file    Inability: Not on file  . Transportation needs    Medical: Not on file    Non-medical: Not on file  Tobacco Use  . Smoking status: Former Games developermoker  . Smokeless tobacco: Never Used  Substance and Sexual Activity  . Alcohol use: Never    Frequency: Never  . Drug use: Never  . Sexual activity: Not Currently   Lifestyle  . Physical activity    Days per week: Not on file    Minutes per session: Not on file  . Stress: Not on file  Relationships  . Social Musicianconnections    Talks on phone: Not on file    Gets together: Not on file    Attends religious service: Not on file    Active member of club or organization: Not on file    Attends meetings of clubs or organizations: Not on file    Relationship status: Not on file  Other Topics Concern  . Not on file  Social History Narrative  . Not on file   Family History: Family History  Problem Relation Age of Onset  . CAD Brother   . Diabetes Neg Hx   . Cancer Neg Hx    Allergies: Allergies  Allergen Reactions  . Morphine Itching and Rash  . Ciprofloxacin Other (See Comments)    AMS  . Latex Hives and Other (See Comments)    Other reaction(s): Other (See Comments)  . Promethazine Other (See Comments)  . Ibuprofen   . Penicillins Hives    Did it involve swelling of the face/tongue/throat, SOB, or low BP? No Did it involve sudden or severe rash/hives, skin peeling, or any reaction on the inside of your mouth or nose? No Did you need  to seek medical attention at a hospital or doctor's office? No When did it last happen? If all above answers are "NO", may proceed with cephalosporin use.    Medications: See med rec.  Review of Systems: No fevers, chills, night sweats, weight loss, chest pain, or shortness of breath.   Objective:    General: Well Developed, well nourished, and in no acute distress.  Neuro: Alert and oriented x3, extra-ocular muscles intact, sensation grossly intact.  HEENT: Normocephalic, atraumatic, pupils equal round reactive to light, neck supple, no masses, no lymphadenopathy, thyroid nonpalpable.  Skin: Warm and dry, no rashes. Cardiac: Regular rate and rhythm, no murmurs rubs or gallops, no lower extremity edema.  Respiratory: Clear to auscultation bilaterally. Not using accessory muscles, speaking in full  sentences. Left knee: Only mild swelling, tender to palpation at the medial joint line ROM normal in flexion and extension and lower leg rotation. Ligaments with solid consistent endpoints including ACL, PCL, LCL, MCL. Negative Mcmurray's and provocative meniscal tests. Non painful patellar compression. Patellar and quadriceps tendons unremarkable. Hamstring and quadriceps strength is normal.  Procedure:  Injection of left knee Consent obtained and verified. Time-out conducted. Noted no overlying erythema, induration, or other signs of local infection. Skin prepped in a sterile fashion. Topical analgesic spray: Ethyl chloride. Completed without difficulty. Meds: 1 cc Kenalog 40, 2 cc lidocaine, 2 cc bupivacaine injected easily Pain immediately improved suggesting accurate placement of the medication. Advised to call if fevers/chills, erythema, induration, drainage, or persistent bleeding.  Impression and Recommendations:    Primary osteoarthritis of left knee Unguided knee x-ray as above. Rehab exercises given, x-rays. Return to see me in 1 month, if persistent discomfort we will consider her lumbar spine as the pain generator.  Lumbar spondylosis It sounds as though she is getting set up with pain management. She does have an L4 compression fracture, from a year ago, with mild retropulsion at the L3-L4 level causing central canal stenosis. She has done well in the past with epidurals, we are to get x-rays in anticipation of an MRI. If persistent discomfort I will further evaluate her lumbar spine.   ___________________________________________ Gwen Her. Dianah Field, M.D., ABFM., CAQSM. Primary Care and Sports Medicine [facility]  Adjunct Professor of [employer] [facility] [employer] [facility] [employer] [facility]

## 2019-04-02 LAB — COMPLETE METABOLIC PANEL WITH GFR
AG Ratio: 1.5 (calc) (ref 1.0–2.5)
ALT: 29 U/L (ref 6–29)
AST: 28 U/L (ref 10–35)
Albumin: 4.2 g/dL (ref 3.6–5.1)
Alkaline phosphatase (APISO): 88 U/L (ref 37–153)
BUN/Creatinine Ratio: 21 (calc) (ref 6–22)
BUN: 24 mg/dL (ref 7–25)
CO2: 31 mmol/L (ref 20–32)
Calcium: 11.1 mg/dL — ABNORMAL HIGH (ref 8.6–10.4)
Chloride: 97 mmol/L — ABNORMAL LOW (ref 98–110)
Creat: 1.17 mg/dL — ABNORMAL HIGH (ref 0.60–0.93)
GFR, Est African American: 54 mL/min/{1.73_m2} — ABNORMAL LOW (ref >=60)
GFR, Est Non African American: 46 mL/min/{1.73_m2} — ABNORMAL LOW (ref >=60)
Globulin: 2.8 g/dL (calc) (ref 1.9–3.7)
Glucose, Bld: 123 mg/dL — ABNORMAL HIGH (ref 65–99)
Potassium: 4.8 mmol/L (ref 3.5–5.3)
Sodium: 141 mmol/L (ref 135–146)
Total Bilirubin: 0.6 mg/dL (ref 0.2–1.2)
Total Protein: 7 g/dL (ref 6.1–8.1)

## 2019-04-02 LAB — URINALYSIS, MICROSCOPIC ONLY
Bacteria, UA: NONE SEEN /HPF
Hyaline Cast: NONE SEEN /LPF
Squamous Epithelial / HPF: NONE SEEN /HPF (ref <=5)
WBC, UA: NONE SEEN /HPF (ref 0–5)

## 2019-04-02 LAB — URINE CULTURE
MICRO NUMBER:: 564512
SPECIMEN QUALITY:: ADEQUATE

## 2019-04-02 LAB — TSH: TSH: 1.1 mIU/L (ref 0.40–4.50)

## 2019-04-02 LAB — CBC
HCT: 48 % — ABNORMAL HIGH (ref 35.0–45.0)
Hemoglobin: 15.9 g/dL — ABNORMAL HIGH (ref 11.7–15.5)
MCH: 32.2 pg (ref 27.0–33.0)
MCHC: 33.1 g/dL (ref 32.0–36.0)
MCV: 97.2 fL (ref 80.0–100.0)
MPV: 10.6 fL (ref 7.5–12.5)
Platelets: 237 10*3/uL (ref 140–400)
RBC: 4.94 10*6/uL (ref 3.80–5.10)
RDW: 13.4 % (ref 11.0–15.0)
WBC: 8.9 10*3/uL (ref 3.8–10.8)

## 2019-04-02 LAB — HEMOGLOBIN A1C
Hgb A1c MFr Bld: 6.9 % of total Hgb — ABNORMAL HIGH (ref <5.7)
Mean Plasma Glucose: 151 (calc)
eAG (mmol/L): 8.4 (calc)

## 2019-04-02 LAB — MAGNESIUM: Magnesium: 2.4 mg/dL (ref 1.5–2.5)

## 2019-04-02 LAB — T4, FREE: Free T4: 1.4 ng/dL (ref 0.8–1.8)

## 2019-04-04 NOTE — Addendum Note (Signed)
Addended by: Maryla Morrow on: 04/04/2019 08:19 AM   Modules accepted: Orders

## 2019-04-05 ENCOUNTER — Encounter: Payer: Self-pay | Admitting: Osteopathic Medicine

## 2019-04-05 ENCOUNTER — Ambulatory Visit (INDEPENDENT_AMBULATORY_CARE_PROVIDER_SITE_OTHER): Payer: Medicare Other | Admitting: Osteopathic Medicine

## 2019-04-05 VITALS — BP 135/60 | HR 101 | Temp 97.8°F | Wt 219.5 lb

## 2019-04-05 DIAGNOSIS — M79605 Pain in left leg: Secondary | ICD-10-CM | POA: Diagnosis not present

## 2019-04-05 DIAGNOSIS — R5382 Chronic fatigue, unspecified: Secondary | ICD-10-CM

## 2019-04-05 NOTE — Progress Notes (Signed)
HPI: Valerie Keller is a 73 y.o. female who  has a past medical history of Diabetes mellitus without complication (HCC), Fibromyalgia, High blood pressure, High cholesterol, Lung disease, Myocardial infarction (HCC), Nephrolithiasis, Pulmonary embolus (HCC), and Renal disorder.  she [facility] today, 04/05/19,  for chief complaint of:  Follow-up fatigue, BP, hypercalcemia   Last week (4 days ago) was seen for fatigue, worsening over past couple weeks. Labs fairly non-revealing and chronic kidney disease stable, but calcium was above normal, repeat labs are pending. BP was also low, we decided hold losartan and also reduce clonazepam to reduce risk of falls.   Today pt has some difficulty remembering our conversation 3 days ago about her lab results, loses track of time easily per husband, she reports she was here yesterday or the day before but it's been 4 days. At any rate, she is in good spirits otherwise, feeling more alert, still fatigued but better than when she last saw me.   BP looks improved today.  BP Readings from Last 3 Encounters:  04/05/19 135/60  04/01/19 (!) 110/49  01/27/19 (!) 124/58    Patient is accompanied by husband who assists with history-taking. Husband agrees she is doing a bit better today.         At today's visit 04/05/19 ... PMH, PSH, FH reviewed and updated as needed.  Current medication list and allergy/intolerance hx reviewed and updated as needed. (See remainder of HPI, ROS, Phys Exam below)   Recent Results (from the past 2160 hour(s))  CBC with Differential/Platelet     Status: Abnormal   Collection Time: 01/27/19  1:59 PM  Result Value Ref Range   WBC 9.1 3.8 - 10.8 Thousand/uL   RBC 4.91 3.80 - 5.10 Million/uL   Hemoglobin 16.1 (H) 11.7 - 15.5 g/dL   HCT 16.145.9 (H) 09.635.0 - 04.545.0 %   MCV 93.5 80.0 - 100.0 fL   MCH 32.8 27.0 - 33.0 pg   MCHC 35.1 32.0 - 36.0 g/dL   RDW 40.913.2 81.111.0 - 91.415.0 %   Platelets 245 140 - 400 Thousand/uL   MPV 10.6 7.5 - 12.5 fL   Neutro Abs 5,424 1,500 - 7,800 cells/uL   Lymphs Abs 2,712 850 - 3,900 cells/uL   Absolute Monocytes 692 200 - 950 cells/uL   Eosinophils Absolute 218 15 - 500 cells/uL   Basophils Absolute 55 0 - 200 cells/uL   Neutrophils Relative % 59.6 %   Total Lymphocyte 29.8 %   Monocytes Relative 7.6 %   Eosinophils Relative 2.4 %   Basophils Relative 0.6 %  COMPLETE METABOLIC PANEL WITH GFR     Status: Abnormal   Collection Time: 01/27/19  1:59 PM  Result Value Ref Range   Glucose, Bld 100 (H) 65 - 99 mg/dL    Comment: .            Fasting reference interval . For someone without known diabetes, a glucose value between 100 and 125 mg/dL is consistent with prediabetes and should be confirmed with a follow-up test. .    BUN 23 7 - 25 mg/dL   Creat 7.821.42 (H) 9.560.60 - 0.93 mg/dL    Comment: For patients >74 years of age, the reference limit for Creatinine is approximately 13% higher for people identified as African-American. .    GFR, Est Non African American 37 (L) > OR = 60 mL/min/1.5173m2   GFR, Est African American 42 (L) > OR = 60  mL/min/1.4473m2   BUN/Creatinine Ratio 16 6 - 22 (calc)   Sodium 140 135 - 146 mmol/L   Potassium 4.0 3.5 - 5.3 mmol/L   Chloride 98 98 - 110 mmol/L   CO2 30 20 - 32 mmol/L   Calcium 10.3 8.6 - 10.4 mg/dL   Total Protein 6.7 6.1 - 8.1 g/dL   Albumin 4.1 3.6 - 5.1 g/dL   Globulin 2.6 1.9 - 3.7 g/dL (calc)   AG Ratio 1.6 1.0 - 2.5 (calc)   Total Bilirubin 0.6 0.2 - 1.2 mg/dL   Alkaline phosphatase (APISO) 81 37 - 153 U/L   AST 22 10 - 35 U/L   ALT 25 6 - 29 U/L  Urine Culture     Status: None   Collection Time: 04/01/19  9:46 AM   Specimen: Urine  Result Value Ref Range   MICRO NUMBER: [id number]    SPECIMEN QUALITY: Adequate    Sample Source NOT GIVEN    STATUS: FINAL    Result:      Three or more organisms present, each greater than 10,000 CFU/mL. May represent normal flora contamination  from external genitalia. No further testing is required.  Urinalysis, microscopic only     Status: None   Collection Time: 04/01/19  9:46 AM  Result Value Ref Range   WBC, UA NONE SEEN 0 - 5 /HPF   RBC / HPF 0-2 0 - 2 /HPF   Squamous Epithelial / LPF NONE SEEN < OR = 5 /HPF   Bacteria, UA NONE SEEN NONE SEEN /HPF   Hyaline Cast NONE SEEN NONE SEEN /LPF  POCT Urinalysis Dipstick     Status: None   Collection Time: 04/01/19  9:59 AM  Result Value Ref Range   Color, UA Yellow    Clarity, UA Clear    Glucose, UA Negative Negative   Bilirubin, UA Negative    Ketones, UA 1.020    Spec Grav, UA 1.020 1.010 - 1.025   Blood, UA Negative    pH, UA 8.0 5.0 - 8.0   Protein, UA Negative Negative   Urobilinogen, UA 0.2 0.2 or 1.0 E.U./dL   Nitrite, UA Negative    Leukocytes, UA Negative Negative   Appearance     Odor    CBC     Status: Abnormal   Collection Time: 04/01/19 11:09 AM  Result Value Ref Range   WBC 8.9 3.8 - 10.8 Thousand/uL   RBC 4.94 3.80 - 5.10 Million/uL   Hemoglobin 15.9 (H) 11.7 - 15.5 g/dL   HCT 29.548.0 (H) 62.135.0 - 30.845.0 %   MCV 97.2 80.0 - 100.0 fL   MCH 32.2 27.0 - 33.0 pg   MCHC 33.1 32.0 - 36.0 g/dL   RDW 65.713.4 84.611.0 - 96.215.0 %   Platelets 237 140 - 400 Thousand/uL   MPV 10.6 7.5 - 12.5 fL  COMPLETE METABOLIC PANEL WITH GFR     Status: Abnormal   Collection Time: 04/01/19 11:09 AM  Result Value Ref Range   Glucose, Bld 123 (H) 65 - 99 mg/dL    Comment: .            Fasting reference interval . For someone without known diabetes, a glucose value between 100 and 125 mg/dL is consistent with prediabetes and should be confirmed with a follow-up test. .    BUN 24 7 - 25 mg/dL   Creat 9.521.17 (H) 8.410.60 - 0.93 mg/dL    Comment: For patients >349 years of age, the  reference limit for Creatinine is approximately 13% higher for people identified as African-American. .    GFR, Est Non African American 46 (L) > OR = 60 mL/min/1.7173m2   GFR, Est African American 54 (L) > OR =  60 mL/min/1.2473m2   BUN/Creatinine Ratio 21 6 - 22 (calc)   Sodium 141 135 - 146 mmol/L   Potassium 4.8 3.5 - 5.3 mmol/L   Chloride 97 (L) 98 - 110 mmol/L   CO2 31 20 - 32 mmol/L   Calcium 11.1 (H) 8.6 - 10.4 mg/dL   Total Protein 7.0 6.1 - 8.1 g/dL   Albumin 4.2 3.6 - 5.1 g/dL   Globulin 2.8 1.9 - 3.7 g/dL (calc)   AG Ratio 1.5 1.0 - 2.5 (calc)   Total Bilirubin 0.6 0.2 - 1.2 mg/dL   Alkaline phosphatase (APISO) 88 37 - 153 U/L   AST 28 10 - 35 U/L   ALT 29 6 - 29 U/L  TSH     Status: None   Collection Time: 04/01/19 11:09 AM  Result Value Ref Range   TSH 1.10 0.40 - 4.50 mIU/L  T4, free     Status: None   Collection Time: 04/01/19 11:09 AM  Result Value Ref Range   Free T4 1.4 0.8 - 1.8 ng/dL  Magnesium     Status: None   Collection Time: 04/01/19 11:09 AM  Result Value Ref Range   Magnesium 2.4 1.5 - 2.5 mg/dL  Hemoglobin W2NA1c     Status: Abnormal   Collection Time: 04/01/19 11:09 AM  Result Value Ref Range   Hgb A1c MFr Bld 6.9 (H) <5.7 % of total Hgb    Comment: For someone without known diabetes, a hemoglobin A1c value of 6.5% or greater indicates that they may have  diabetes and this should be confirmed with a follow-up  test. . For someone with known diabetes, a value <7% indicates  that their diabetes is well controlled and a value  greater than or equal to 7% indicates suboptimal  control. A1c targets should be individualized based on  duration of diabetes, age, comorbid conditions, and  other considerations. . Currently, no consensus exists regarding use of hemoglobin A1c for diagnosis of diabetes for children. .    Mean Plasma Glucose 151 (calc)   eAG (mmol/L) 8.4 (calc)          ASSESSMENT/PLAN: The primary encounter diagnosis was Chronic fatigue. Diagnoses of Hypercalcemia and Left leg pain were also pertinent to this visit.   Pending evaluation for hypercalcemia.  Patient had a recent a which did not show any concern for potential malignancy.   She has stopped her calcium supplementation, per husband who manages meds.   Fatigue is almost certainly multifactorial.  I do have some concern for polypharmacy, patient is on multiple psychoactive medications with potential sedating effects, she is sedentary.  She seems a bit better since we removed losartan and blood pressures improved.  She states fatigue/lack of energy and lack of motivation to form much in the way of any activity also is affected by her leg pain  Reviewed records from recent sports medicine visit, patient states that knee injection was helpful but left leg/buttock pain is still bothersome.  Dr. Karie Schwalbe had some concern for needing to pursue MRI/injections, patient has already been referred to pain management  I think we can continue to monitor, await results from labs today for further evaluation of hypercalcemia.  Single cause of fatigue but multifactorial medical comorbidities and polypharmacy  as well as musculoskeletal complaints probably all a factor.   Orders Placed This Encounter  Procedures  . VITAMIN D 25 Hydroxy (Vit-D Deficiency, Fractures)        Follow-up plan: Return for recheck depending on labs, otherwise in 3 months for A1C / sooner if needed .                                                 ################################################# ################################################# ################################################# #################################################    No outpatient medications have been marked as taking for the 04/05/19 encounter (Appointment) with Emeterio Reeve, DO.    Allergies  Allergen Reactions  . Morphine Itching and Rash  . Ciprofloxacin Other (See Comments)    AMS  . Latex Hives and Other (See Comments)    Other reaction(s): Other (See Comments)  . Promethazine Other (See Comments)  . Ibuprofen   . Penicillins Hives    Did it involve swelling of  the face/tongue/throat, SOB, or low BP? No Did it involve sudden or severe rash/hives, skin peeling, or any reaction on the inside of your mouth or nose? No Did you need to seek medical attention at a hospital or doctor's office? No When did it last happen? If all above answers are "NO", may proceed with cephalosporin use.        Review of Systems:  Constitutional: No fever/chills, (+)fatigue  HEENT: No  headache, no vision change  Cardiac: No  chest pain, No  pressure, No palpitations  Respiratory:  No  shortness of breath. No  Cough  Gastrointestinal: No  abdominal pain, no change on bowel habits  Musculoskeletal: + myalgia/arthralgia  Skin: No  Rash  Neurologic: +generalized weakness, No  Dizziness  Psychiatric: +concerns with depression, +concerns with anxiety  Exam:  BP 135/60 (BP Location: Left Arm, Patient Position: Sitting, Cuff Size: Normal)   Pulse (!) 101   Temp 97.8 F (36.6 C) (Oral)   Wt 219 lb 8 oz (99.6 kg)   BMI 41.47 kg/m   Constitutional: VS see above. General Appearance: alert, well-developed, well-nourished, NAD  Eyes: Normal lids and conjunctive, non-icteric sclera  Ears, Nose, Mouth, Throat: MMM, Normal external inspection ears/nares/mouth/lips/gums.  Neck: No masses, trachea midline.   Respiratory: Normal respiratory effort.   Neurological: Normal balance/coordination. No tremor.  Skin: warm, dry, intact.   Psychiatric: Normal judgment/insight. Normal mood and affect. Oriented x person, x place.       Visit summary with medication list and pertinent instructions was printed for patient to review, patient was advised to alert [place] if any updates are needed. All questions at time of visit were answered - patient instructed to contact office with any additional concerns. ER/RTC precautions were reviewed with the patient and understanding verbalized.   Note: Total time spent 25 minutes, greater than 50% of the visit was spent  face-to-face counseling and coordinating care for the following: The primary encounter diagnosis was Chronic fatigue. Diagnoses of Hypercalcemia and Left leg pain were also pertinent to this visit.Marland Kitchen  Please note: voice recognition software was used to produce this document, and typos may escape review. Please contact Dr. Sheppard Coil for any needed clarifications.    Follow up plan: Return for recheck depending on labs, otherwise in 3 months for A1C / sooner if needed .

## 2019-04-06 ENCOUNTER — Telehealth: Payer: Self-pay | Admitting: Pulmonary Disease

## 2019-04-06 ENCOUNTER — Encounter: Payer: Self-pay | Admitting: Osteopathic Medicine

## 2019-04-06 LAB — VITAMIN D 25 HYDROXY (VIT D DEFICIENCY, FRACTURES): Vit D, 25-Hydroxy: 42 ng/mL (ref 30–100)

## 2019-04-06 LAB — PHOSPHORUS: Phosphorus: 3.1 mg/dL (ref 2.1–4.3)

## 2019-04-06 LAB — CALCIUM, IONIZED: Calcium, Ion: 5.54 mg/dL (ref 4.8–5.6)

## 2019-04-06 LAB — PARATHYROID HORMONE, INTACT (NO CA): PTH: 73 pg/mL — ABNORMAL HIGH (ref 14–64)

## 2019-04-06 NOTE — Telephone Encounter (Signed)
Returned call to patient who states they received a call about appt on 7/1 with BI. They were offered appt on 6/29 with PM. They were going to call back. PM's schedule now full. Are there any other options for this consults.

## 2019-04-07 DIAGNOSIS — E213 Hyperparathyroidism, unspecified: Secondary | ICD-10-CM | POA: Insufficient documentation

## 2019-04-07 NOTE — Telephone Encounter (Signed)
Called and spoke with pt to see if she would like to see Dr.  Vaughan Browner for consult 6/29 and she stated she would like to move the consult up. Pt's appt has been changed to 6/29 with Dr. Vaughan Browner. Nothing further needed.

## 2019-04-07 NOTE — Addendum Note (Signed)
Addended by: Maryla Morrow on: 04/07/2019 07:52 AM   Modules accepted: Orders

## 2019-04-12 ENCOUNTER — Encounter: Payer: Self-pay | Admitting: Sports Medicine

## 2019-04-12 ENCOUNTER — Ambulatory Visit (INDEPENDENT_AMBULATORY_CARE_PROVIDER_SITE_OTHER): Payer: Medicare Other | Admitting: Sports Medicine

## 2019-04-12 DIAGNOSIS — M1712 Unilateral primary osteoarthritis, left knee: Secondary | ICD-10-CM

## 2019-04-12 DIAGNOSIS — M5416 Radiculopathy, lumbar region: Secondary | ICD-10-CM | POA: Diagnosis not present

## 2019-04-12 DIAGNOSIS — M4807 Spinal stenosis, lumbosacral region: Secondary | ICD-10-CM

## 2019-04-12 MED ORDER — PREGABALIN 50 MG PO CAPS: 50.0000 mg | ORAL_CAPSULE | Freq: Three times a day (TID) | ORAL | 0 refills | Status: DC | PRN

## 2019-04-12 NOTE — Assessment & Plan Note (Addendum)
3 vertebral compression fracture with moderate height loss, and a bit of retropulsion. Left-sided L4 radiculopathy with spasm and weakness in the great toe. At this point she has failed greater than 6 weeks of conservative measures, proceeding with an MRI of the lumbar spine for epidural planning. Starting low-dose Lyrica 50 mg with a free trial voucher. She will take #2 Klonopin 1 hour before her MRI for claustrophobia.  Several vertebral compression fractures are seen, there is also severe spinal stenosis at the T10-T11 level.   I would certainly like to see how Lyrica up taper does before we consider an epidural at this level.

## 2019-04-12 NOTE — Progress Notes (Addendum)
Subjective:    CC: Leg and back pain  HPI: This is a very pleasant 73 year old female with a L3 vertebral compression fracture with mild retropulsion.  I saw her about 2 weeks ago with pain in her left knee, we injected the knee, she had good relief of the pain in and around her knee but continues to have pain radiating down the thigh, lower leg to the great toe with significant spasm, she has progressive weakness, pain is severe, persistent, radiating as above.  I reviewed the past medical history, family history, social history, surgical history, and allergies today and no changes were needed.  Please see the problem list section below in epic for further details.  Past Medical History: Past Medical History:  Diagnosis Date  . Diabetes mellitus without complication ([place])   . Fibromyalgia   . High blood pressure   . High cholesterol   . Lung disease   . Myocardial infarction ([place])   . Nephrolithiasis   . Pulmonary embolus ([place])   . Renal disorder    Past Surgical History: Past Surgical History:  Procedure Laterality Date  . ABDOMINAL HYSTERECTOMY    . CHOLECYSTECTOMY    . CORONARY STENT PLACEMENT     x3  . FOOT SURGERY    . KIDNEY SURGERY    . TONSILLECTOMY     Social History: Social History   Socioeconomic History  . Marital status: Married    Spouse name: Fayrene Helper  . Number of children: 1  . Years of education: Not on file  . Highest education level: High school graduate  Occupational History    Comment: na  Social Needs  . Financial resource strain: Not on file  . Food insecurity    Worry: Not on file    Inability: Not on file  . Transportation needs    Medical: Not on file    Non-medical: Not on file  Tobacco Use  . Smoking status: Former Smoker    Packs/day: 0.25    Years: 32.00    Pack years: 8.00    Types: Cigarettes    Quit date: 10/21/1983    Years since quitting: 35.6  . Smokeless tobacco: Never Used  Substance and Sexual Activity  . Alcohol use:  Never    Frequency: Never  . Drug use: Never  . Sexual activity: Not Currently  Lifestyle  . Physical activity    Days per week: Not on file    Minutes per session: Not on file  . Stress: Not on file  Relationships  . Social Herbalist on phone: Not on file    Gets together: Not on file    Attends religious service: Not on file    Active member of club or organization: Not on file    Attends meetings of clubs or organizations: Not on file    Relationship status: Not on file  Other Topics Concern  . Not on file  Social History Narrative   Lives with spouse   Caffeine- coffee, tea, soda- one daily or less   Family History: Family History  Problem Relation Age of Onset  . CAD Brother   . Diabetes Neg Hx   . Cancer Neg Hx    Allergies: Allergies  Allergen Reactions  . Morphine Itching and Rash  . Ciprofloxacin Other (See Comments)    AMS  . Latex Hives and Other (See Comments)    Other reaction(s): Other (See Comments)  . Promethazine Other (See Comments)  .  Ibuprofen   . Penicillins Hives    Did it involve swelling of the face/tongue/throat, SOB, or low BP? No Did it involve sudden or severe rash/hives, skin peeling, or any reaction on the inside of your mouth or nose? No Did you need to seek medical attention at a hospital or doctor's office? No When did it last happen? If all above answers are "NO", may proceed with cephalosporin use.    Medications: See med rec.  Review of Systems: No fevers, chills, night sweats, weight loss, chest pain, or shortness of breath.   Objective:    General: Well Developed, well nourished, and in no acute distress.  Neuro: Alert and oriented x3, extra-ocular muscles intact, sensation grossly intact.  HEENT: Normocephalic, atraumatic, pupils equal round reactive to light, neck supple, no masses, no lymphadenopathy, thyroid nonpalpable.  Skin: Warm and dry, no rashes. Cardiac: Regular rate and rhythm, no murmurs rubs or  gallops, no lower extremity edema.  Respiratory: Clear to auscultation bilaterally. Not using accessory muscles, speaking in full sentences. Back Exam:  Inspection: Unremarkable  Motion: Flexion 45 deg, Extension 45 deg, Side Bending to 45 deg bilaterally,  Rotation to 45 deg bilaterally  SLR laying: Negative  XSLR laying: Negative  Palpable tenderness: None. FABER: negative. Sensory change: Gross sensation intact to all lumbar and sacral dermatomes.  Reflexes: 2+ at both patellar tendons, 2+ at achilles tendons, Babinski's downgoing.  Strength at foot  Plantar-flexion: 5/5 Dorsi-flexion: 4/5 Eversion: 5/5 Inversion: 5/5  Leg strength  Quad: 5/5 Hamstring: 5/5 Hip flexor: 5/5 Hip abductors: 5/5  Gait unremarkable.  Impression and Recommendations:    Primary osteoarthritis of left knee Left knee is doing better after injection but she still has pain going down the leg to the big toe.  Left lumbar radiculopathy 3 vertebral compression fracture with moderate height loss, and a bit of retropulsion. Left-sided L4 radiculopathy with spasm and weakness in the great toe. At this point she has failed greater than 6 weeks of conservative measures, proceeding with an MRI of the lumbar spine for epidural planning. Starting low-dose Lyrica 50 mg with a free trial voucher. She will take #2 Klonopin 1 hour before her MRI for claustrophobia.  Several vertebral compression fractures are seen, there is also severe spinal stenosis at the T10-T11 level.   I would certainly like to see how Lyrica up taper does before we consider an epidural at this level.   ___________________________________________ Ihor Austinhomas J. Benjamin Stainhekkekandam, M.D., ABFM., CAQSM. Primary Care and Sports Medicine [facility]  Adjunct Professor of Family Medicine  [employer]

## 2019-04-12 NOTE — Assessment & Plan Note (Signed)
Left knee is doing better after injection but she still has pain going down the leg to the big toe.

## 2019-04-18 ENCOUNTER — Institutional Professional Consult (permissible substitution): Payer: Medicare Other | Admitting: Pulmonary Disease

## 2019-04-20 ENCOUNTER — Institutional Professional Consult (permissible substitution): Payer: Medicare Other | Admitting: Pulmonary Disease

## 2019-04-26 ENCOUNTER — Ambulatory Visit (INDEPENDENT_AMBULATORY_CARE_PROVIDER_SITE_OTHER): Payer: Medicare Other | Admitting: Pulmonary Disease

## 2019-04-26 ENCOUNTER — Other Ambulatory Visit: Payer: Self-pay

## 2019-04-26 ENCOUNTER — Encounter: Payer: Self-pay | Admitting: Pulmonary Disease

## 2019-04-26 VITALS — BP 112/70 | HR 100 | Temp 97.9°F | Ht 60.0 in | Wt 216.0 lb

## 2019-04-26 DIAGNOSIS — G4733 Obstructive sleep apnea (adult) (pediatric): Secondary | ICD-10-CM

## 2019-04-26 DIAGNOSIS — J449 Chronic obstructive pulmonary disease, unspecified: Secondary | ICD-10-CM

## 2019-04-26 NOTE — Progress Notes (Signed)
Valerie Keller    [id number]    07/28/1946  Primary Care Physician:Alexander, Dorene GrebeNatalie, DO  Referring Physician: Sunnie Nielsenlexander, Natalie, DO [address] [place],  [place] [id number]  Chief complaint: Consult for evaluation of COPD  HPI: 73 y.o.femalewith a history of suspected COPD on intermittent oxygen chronically, OSA, stage III CKD, obesity, T2DM, fibromyalgia, and CAD s/p PCI  Admitted to the hospital in January 2020 with dyspnea.  Found to have subsegmental PE and discharged on Xarelto.  Also given prednisone, azithro for presumed COPD  [place]  [place] in [place].  PFTs at that time did not show any obstruction.  She was diagnosed with restrictive lung disease due to obesity and obesity hypoventilation syndrome, OSA.  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  No mold, hot tub, Jacuzzi. Smoking history:8-pack-year smoker.  Quit in 1985 Travel history: No significant recent travel Relevant family history: No family history of lung disease.  Outpatient Encounter Medications as of 04/26/2019  Medication Sig  . AMBULATORY NON FORMULARY MEDICATION Blood pressure monitor, around upper arm, not wrist.  Per patient preference and insurance coverage.  . AMBULATORY NON FORMULARY MEDICATION Nebulizer device miscellaneous: Please include machine, necessary tubing and masks.  Dispensed per patient preference and insurance coverage.  Diagnosis COPD, pneumonia.  . ARIPiprazole (ABILIFY) 10 MG tablet Take 10 mg by mouth daily.  Marland Kitchen. atorvastatin (LIPITOR) 40 MG tablet Take 1 tablet (40 mg total) by mouth at bedtime.  . benztropine (COGENTIN) 0.5 MG tablet Take 0.5 mg by mouth 2 (two) times daily.  . brompheniramine-pseudoephedrine-DM 30-2-10 MG/5ML syrup Take 2.5-5 mLs by mouth 4 (four) times daily as needed (cough, congestion).  . clonazepam  (KLONOPIN) 2 MG disintegrating tablet Take 1 mg by mouth 3 (three) times daily as needed (anxiety).  Marland Kitchen. diphenoxylate-atropine (LOMOTIL) 2.5-0.025 MG tablet One to 2 tablets by mouth 4 times a day as needed for diarrhea.  . DULoxetine (CYMBALTA) 60 MG capsule Take 120 mg by mouth daily.   . fluticasone (FLONASE ALLERGY RELIEF) 50 MCG/ACT nasal spray Place 2 sprays into the nose daily as needed for allergies.  . furosemide (LASIX) 40 MG tablet Take 2 tablets (80 mg total) by mouth daily. (Patient taking differently: Take 40 mg by mouth 2 (two) times daily. )  . HUMALOG KWIKPEN 100 UNIT/ML KwikPen Inject 7 Units into the skin 3 (three) times daily before meals.  . Insulin Pen Needle (PEN NEEDLES) 31G X 8 MM MISC For use with insulin pen qid as directed, please dispense per patient preference and insurance coverage  . Ipratropium-Albuterol (COMBIVENT RESPIMAT) 20-100 MCG/ACT AERS respimat Inhale 1 puff into the lungs every 6 (six) hours as needed for wheezing.  Marland Kitchen. ipratropium-albuterol (DUONEB) 0.5-2.5 (3) MG/3ML SOLN Take 3 mLs by nebulization every 2 (two) hours as needed (wheeze, SOB).  Marland Kitchen. LANTUS SOLOSTAR 100 UNIT/ML Solostar Pen INJECT 34 UNITS INTO THE SKIN AT BEDTIME.  . Magnesium Oxide 400 (240 Mg) MG TABS Take 1-2 tablets by mouth See admin instructions. 1 tablet in the AM and 2 tablets in the evening  . metFORMIN (GLUCOPHAGE) 850 MG tablet Take 0.5 tablets (425 mg total) by mouth 2 (two) times daily.  . nitroGLYCERIN (NITROSTAT) 0.4 MG SL tablet Place 1 tablet (0.4 mg total) under the tongue every 5 (five) minutes as needed for chest  pain.  . ondansetron (ZOFRAN-ODT) 8 MG disintegrating tablet Take 1 tablet (8 mg total) by mouth every 8 (eight) hours as needed for nausea.  . potassium chloride SA (K-DUR,KLOR-CON) 20 MEQ tablet Take 20 mEq by mouth daily.  . pregabalin (LYRICA) 50 MG capsule Take 1 capsule (50 mg total) by mouth 3 (three) times daily as needed.  Marland Kitchen. spironolactone (ALDACTONE) 25 MG  tablet Take 1 tablet (25 mg total) by mouth daily.  Marland Kitchen. albuterol (PROVENTIL) (2.5 MG/3ML) 0.083% nebulizer solution Take 3 mLs by nebulization every 4 (four) hours as needed for shortness of breath.  . losartan (COZAAR) 50 MG tablet Take 1 tablet (50 mg total) by mouth daily. HOLDING AS OF 04/01/19 D/T HYPOTENSION (DR Lyn HollingsheadALEXANDER) (Patient not taking: Reported on 04/26/2019)  . Multiple Vitamin (MULTIVITAMIN) capsule Take 1 capsule by mouth daily.  . rivaroxaban (XARELTO) 20 MG TABS tablet Take 1 tablet (20 mg total) by mouth daily with supper for 30 days.  Marland Kitchen. rOPINIRole (REQUIP) 2 MG tablet Take 6 mg by mouth at bedtime.   . Vitamin D, Ergocalciferol, (DRISDOL) 1.25 MG (50000 UT) CAPS capsule Take 1 capsule (50,000 Units total) by mouth every 14 (fourteen) days. (Patient not taking: Reported on 04/26/2019)   No facility-administered encounter medications on file as of 04/26/2019.     Allergies as of 04/26/2019 - Review Complete 04/26/2019  Allergen Reaction Noted  . Morphine Itching and Rash 06/25/2018  . Ciprofloxacin Other (See Comments) 02/01/2018  . Latex Hives and Other (See Comments) 05/26/2014  . Promethazine Other (See Comments) 02/10/2017  . Ibuprofen  11/10/2018  . Penicillins Hives 02/01/2018    Past Medical History:  Diagnosis Date  . Diabetes mellitus without complication (HCC)   . Fibromyalgia   . High blood pressure   . High cholesterol   . Lung disease   . Myocardial infarction (HCC)   . Nephrolithiasis   . Pulmonary embolus (HCC)   . Renal disorder     Past Surgical History:  Procedure Laterality Date  . ABDOMINAL HYSTERECTOMY    . CHOLECYSTECTOMY    . CORONARY STENT PLACEMENT     x3  . FOOT SURGERY    . KIDNEY SURGERY    . TONSILLECTOMY      Family History  Problem Relation Age of Onset  . CAD Brother   . Diabetes Neg Hx   . Cancer Neg Hx     Social History   Socioeconomic History  . Marital status: Married    Spouse name: Not on file  . Number of  children: 1  . Years of education: Not on file  . Highest education level: Not on file  Occupational History  . Not on file  Social Needs  . Financial resource strain: Not on file  . Food insecurity    Worry: Not on file    Inability: Not on file  . Transportation needs    Medical: Not on file    Non-medical: Not on file  Tobacco Use  . Smoking status: Former Smoker    Packs/day: 0.25    Years: 32.00    Pack years: 8.00    Types: Cigarettes    Quit date: 10/21/1983    Years since quitting: 35.5  . Smokeless tobacco: Never Used  Substance and Sexual Activity  . Alcohol use: Never    Frequency: Never  . Drug use: Never  . Sexual activity: Not Currently  Lifestyle  . Physical activity    Days per week: Not  on file    Minutes per session: Not on file  . Stress: Not on file  Relationships  . Social Herbalist on phone: Not on file    Gets together: Not on file    Attends religious service: Not on file    Active member of club or organization: Not on file    Attends meetings of clubs or organizations: Not on file    Relationship status: Not on file  . Intimate partner violence    Fear of current or ex partner: Not on file    Emotionally abused: Not on file    Physically abused: Not on file    Forced sexual activity: Not on file  Other Topics Concern  . Not on file  Social History Narrative  . Not on file    Review of systems: Review of Systems  Constitutional: Negative for fever and chills.  HENT: Negative.   Eyes: Negative for blurred vision.  Respiratory: as per HPI  Cardiovascular: Negative for chest pain and palpitations.  Gastrointestinal: Negative for vomiting, diarrhea, blood per rectum. Genitourinary: Negative for dysuria, urgency, frequency and hematuria.  Musculoskeletal: Negative for myalgias, back pain and joint pain.  Skin: Negative for itching and rash.  Neurological: Negative for dizziness, tremors, focal weakness, seizures and loss of  consciousness.  Endo/Heme/Allergies: Negative for environmental allergies.  Psychiatric/Behavioral: Negative for depression, suicidal ideas and hallucinations.  All other systems reviewed and are negative.  Physical Exam: Blood pressure 112/70, pulse 100, temperature 97.9 F (36.6 C), temperature source Oral, height 5' (1.524 m), weight 216 lb (98 kg), SpO2 96 %. Gen:      No acute distress HEENT:  EOMI, sclera anicteric Neck:     No masses; no thyromegaly Lungs:    Clear to auscultation bilaterally; normal respiratory effort CV:         Regular rate and rhythm; no murmurs Abd:      + bowel sounds; soft, non-tender; no palpable masses, no distension Ext:    No edema; adequate peripheral perfusion Skin:      Warm and dry; no rash Neuro: alert and oriented x 3 Psych: normal mood and affect  Data Reviewed: Imaging: CTA 11/16/2018- small linear defect in left lower lobe.  Coronary artery calcification, mild effusions with subsegmental atelectasis.  I have reviewed the images personally.  Assessment:  Evaluation for COPD Although she carries a diagnosis of COPD she has minimal smoking history.  Apparently PFTs in [place] did not show any evidence of obstruction  She likely has restrictive lung disease due to body habitus and obesity. Order PFTs  OSA/OHS Diagnosed many years ago.  She had been intolerant of CPAP. We will schedule split-night sleep study to reevaluate.  Plan/Recommendations: - PFTs - Split night sleep study  Marshell Garfinkel MD [facility] 04/26/2019, 12:11 PM  CC: Emeterio Reeve, DO

## 2019-04-26 NOTE — Patient Instructions (Signed)
We will schedule you for pulmonary function test and a split-night sleep study Continue using the supplemental oxygen. Follow-up in 1 to 2 months.

## 2019-05-09 ENCOUNTER — Other Ambulatory Visit (HOSPITAL_COMMUNITY)
Admission: RE | Admit: 2019-05-09 | Discharge: 2019-05-09 | Disposition: A | Discharge status: Home / Self Care | Payer: Medicare Other | Source: Ambulatory Visit | Attending: Pulmonary Disease | Admitting: Pulmonary Disease

## 2019-05-09 DIAGNOSIS — Z1159 Encounter for screening for other viral diseases: Secondary | ICD-10-CM | POA: Diagnosis present

## 2019-05-10 LAB — SARS CORONAVIRUS 2 (TAT 6-24 HRS): SARS Coronavirus 2: NEGATIVE

## 2019-05-11 ENCOUNTER — Ambulatory Visit (HOSPITAL_BASED_OUTPATIENT_CLINIC_OR_DEPARTMENT_OTHER): Payer: Medicare Other | Attending: Pulmonary Disease | Admitting: Pulmonary Disease

## 2019-05-11 ENCOUNTER — Other Ambulatory Visit: Payer: Self-pay

## 2019-05-11 VITALS — Ht 60.0 in | Wt 213.0 lb

## 2019-05-11 DIAGNOSIS — G4733 Obstructive sleep apnea (adult) (pediatric): Secondary | ICD-10-CM | POA: Diagnosis present

## 2019-05-11 DIAGNOSIS — Z8709 Personal history of other diseases of the respiratory system: Secondary | ICD-10-CM | POA: Insufficient documentation

## 2019-05-11 DIAGNOSIS — R0902 Hypoxemia: Secondary | ICD-10-CM | POA: Insufficient documentation

## 2019-05-11 DIAGNOSIS — E669 Obesity, unspecified: Secondary | ICD-10-CM

## 2019-05-11 DIAGNOSIS — G4736 Sleep related hypoventilation in conditions classified elsewhere: Secondary | ICD-10-CM

## 2019-05-12 ENCOUNTER — Other Ambulatory Visit: Payer: Self-pay

## 2019-05-20 ENCOUNTER — Telehealth: Payer: Self-pay | Admitting: *Deleted

## 2019-05-20 NOTE — Telephone Encounter (Signed)
Pt's husband left vm yesterday afternoon saying something about needed a neurology referral.  Tried calling him back this morning to get clarification and details but there was no answer and no vm set up.

## 2019-05-24 DIAGNOSIS — G4733 Obstructive sleep apnea (adult) (pediatric): Secondary | ICD-10-CM | POA: Diagnosis not present

## 2019-05-24 NOTE — Procedures (Signed)
    Patient Name: Valerie Keller, Valerie Keller Date: 05/11/2019 Gender: Female D.O.B: Oct 05, 1946 Age (years): 21 Referring Provider: Marshell Garfinkel Height (inches): 9 Interpreting Physician: Chesley Mires MD, ABSM Weight (lbs): 213 RPSGT: Laren Everts BMI: 97 MRN: [id number] Neck Size: 18.00  CLINICAL INFORMATION Sleep Study Type: NPSG  Indication for sleep study: 73 yr old with history of obstructive sleep apnea and COPD.  She had difficulty tolerating CPAP.  Referred to sleep lab to determine if she still has sleep apnea.  Epworth Sleepiness Score: 19  SLEEP STUDY TECHNIQUE As per the AASM Manual for the Scoring of Sleep and Associated Events v2.3 (April 2016) with a hypopnea requiring 4% desaturations.  The channels recorded and monitored were frontal, central and occipital EEG, electrooculogram (EOG), submentalis EMG (chin), nasal and oral airflow, thoracic and abdominal wall motion, anterior tibialis EMG, snore microphone, electrocardiogram, and pulse oximetry.  MEDICATIONS Medications self-administered by patient taken the night of the study : N/A  SLEEP ARCHITECTURE The study was initiated at 9:52:54 PM and ended at 4:25:28 AM.  Sleep onset time was 13.7 minutes and the sleep efficiency was 93.7%%. The total sleep time was 368 minutes.  Stage REM latency was N/A minutes.  The patient spent 0.7%% of the night in stage N1 sleep, 99.3%% in stage N2 sleep, 0.0%% in stage N3 and 0% in REM.  Alpha intrusion was absent.  Supine sleep was 65.71%.  RESPIRATORY PARAMETERS The overall apnea/hypopnea index (AHI) was 2.0 per hour. There were 1 total apneas, including 0 obstructive, 1 central and 0 mixed apneas. There were 11 hypopneas and 2 RERAs.  The AHI during Stage REM sleep was N/A per hour.  AHI while supine was 3.0 per hour.  The mean oxygen saturation was 94.0%. The minimum SpO2 during sleep was 74.0%.  She spent 30.7 minutes of time in bed with an SpO2 < 88%.  She  had 1 liter supplemental oxygen applied with improvement in her oxygenation.  Oxygen desaturation was present for more than 5 minutes in the abscene of apneic events.  soft snoring was noted during this study.  CARDIAC DATA The 2 lead EKG demonstrated sinus rhythm. The mean heart rate was 87.7 beats per minute. Other EKG findings include: PVCs.  LEG MOVEMENT DATA The total PLMS were 0 with a resulting PLMS index of 0.0. Associated arousal with leg movement index was 2.8 .  IMPRESSIONS - While she had a few respiratory events, these were not frequent enough to qualify for a diagnosis of obstructive sleep apnea.  Her overall AHI was 2.  Of not is that she did not achieve REM sleep during this study. - Her SpO2 low was 74% and she spent 30.7 minutes of time in bed with an SpO2 < 88%.  Her oxygenation improved with addition of 1 liter oxygen.  DIAGNOSIS - Nocturnal Hypoxemia (327.26 [G47.36 ICD-10])  RECOMMENDATIONS - Set up 1 liter supplemental oxygen at night.  [Electronically signed] 05/24/2019 10:44 AM  Chesley Mires MD, ABSM Diplomate, American Board of Sleep Medicine   NPI: [id number]

## 2019-05-26 ENCOUNTER — Telehealth: Payer: Self-pay

## 2019-05-26 NOTE — Telephone Encounter (Signed)
Pt's husband called requesting an update on referral request for Neurologist. He stated that this was his second msg. Pls advise, thanks.

## 2019-05-27 NOTE — Telephone Encounter (Signed)
As per pt's husband - the recommendation was made by the pain mgmt specialist. No specific reason give to why the referral is being requested. Pls advise, thanks.

## 2019-05-27 NOTE — Telephone Encounter (Signed)
Spoke to pt's husband - apparently the pain specialist and psychiatrist suggested the referral. As per pt's husband, pt has been having episodes of hallucinations during her appts with both specialists. Informed pt's husband to make a virtual appt with provider to discuss pt's new symptoms in detail. Agree with recommendation, transferred to [facility] for an appt early next week. No other inquiries during call.

## 2019-05-27 NOTE — Telephone Encounter (Signed)
Okay, if the pain management person recommended a neurologist but did not give a reason, I am not really sure what their thoughts might have been or why they didn't place the referral themselves.  We can discuss this more at upcoming visit but a referral really want go through if we do not have a specific diagnosis or concern for them to address.

## 2019-05-27 NOTE — Telephone Encounter (Signed)
I do not think we ever discussed anything about a neurologist, I suggested pain management which may involve neurosurgery, as this what they mean?  That referral was already placed for pain management.  If at recent visit w/ Dr T he had discussed something different, I could not speak to that.    What is the specific issue they need to see a neurologist about?

## 2019-05-31 ENCOUNTER — Telehealth: Payer: Self-pay

## 2019-05-31 ENCOUNTER — Ambulatory Visit (INDEPENDENT_AMBULATORY_CARE_PROVIDER_SITE_OTHER): Payer: Medicare Other | Admitting: Osteopathic Medicine

## 2019-05-31 ENCOUNTER — Other Ambulatory Visit: Payer: Self-pay | Admitting: Osteopathic Medicine

## 2019-05-31 VITALS — BP 127/72 | HR 98

## 2019-05-31 DIAGNOSIS — R443 Hallucinations, unspecified: Secondary | ICD-10-CM | POA: Diagnosis not present

## 2019-05-31 DIAGNOSIS — R4189 Other symptoms and signs involving cognitive functions and awareness: Secondary | ICD-10-CM

## 2019-05-31 NOTE — Progress Notes (Signed)
Virtual Visit via Phone   I connected with      Valerie Keller on 05/31/19 at 12:00 pm by a telemedicine application and verified that I am speaking with the correct person using two identifiers.  Patient is at home I am inoffice   I discussed the limitations of evaluation and management by telemedicine and the availability of in person appointments. The patient expressed understanding and agreed to proceed.  History of Present Illness: Valerie Keller is a 73 y.o. female who would like to discuss referral to neurology   Patient had called our office requesting a referral to neurology, this was not discussed at most recent visit so patient is scheduled for a telephone visit to discuss this further.  She ends up pending thrown off to her husband saying that she cannot hear me very well and he typically speaks for her anyway.  He states that when they were recently seeing her psychiatrist, they both voiced some concerns that she will occasionally see or hear things that are not there.  It does not happen all the time, she does not really seem to be aware of it at the time but she recognizes that she sees and hears things that other people do not see or hear.  The psychiatrist apparently recommended follow-up with neurology for neurocognitive testing.  I do not have office notes available for review on this issue.        Observations/Objective: BP 127/72 (Patient Position: Sitting, Cuff Size: Normal)   Pulse 98  BP Readings from Last 3 Encounters:  05/31/19 127/72  04/26/19 112/70  04/12/19 124/90   Exam: Normal Speech.    Lab and Radiology Results No results found for this or any previous visit (from the past 72 hour(s)). No results found.     Assessment and Plan: 73 y.o. female with The primary encounter diagnosis was Hallucination. A diagnosis of Cognitive deficits was also pertinent to this visit.  Hallucinations are apparently not a new issue for her, polypharmacy  is certainly a concern which I have addressed with her in the past but she states quality of life on the medications is worse the potential risks of confusion, delirium, falls, or even death. Her family would like to see neurology to discuss further, I do not have the office notes from psychiatry though family reports that psychiatrist was the person who initially recommended neurology evaluation, so I cannot speak to that discussion.  Patient is welcome to come here for at least cognitive testing and further discussion, she does not seem particularly willing to speak over the phone and has her husband do most of the talking.  He really wants her to see a neurologist, will arrange referral   PDMP not reviewed this encounter. Orders Placed This Encounter  Procedures  . Ambulatory referral to Neurology    Referral Priority:   Routine    Referral Type:   Consultation    Referral Reason:   Specialty Services Required    Requested Specialty:   Neurology    Number of Visits Requested:   1   No orders of the defined types were placed in this encounter.  There are no Patient Instructions on file for this visit.  Instructions sent via MyChart. If MyChart not available, pt was given option for info via personal e-mail w/ no guarantee of protected health info over unsecured e-mail communication, and MyChart sign-up instructions were included.   Follow Up Instructions: Return in about 1  month (around 07/01/2019) for Office visit, diabetes follow-up.  OV 40 please.    I discussed the assessment and treatment plan with the patient. The patient was provided an opportunity to ask questions and all were answered. The patient agreed with the plan and demonstrated an understanding of the instructions.   The patient was advised to call back or seek an in-person evaluation if any new concerns, if symptoms worsen or if the condition fails to improve as anticipated.  30 minutes of non-face-to-face time was  provided during this encounter.                      Historical information moved to improve visibility of documentation.  Past Medical History:  Diagnosis Date  . Diabetes mellitus without complication (HCC)   . Fibromyalgia   . High blood pressure   . High cholesterol   . Lung disease   . Myocardial infarction (HCC)   . Nephrolithiasis   . Pulmonary embolus (HCC)   . Renal disorder    Past Surgical History:  Procedure Laterality Date  . ABDOMINAL HYSTERECTOMY    . CHOLECYSTECTOMY    . CORONARY STENT PLACEMENT     x3  . FOOT SURGERY    . KIDNEY SURGERY    . TONSILLECTOMY     Social History   Tobacco Use  . Smoking status: Former Smoker    Packs/day: 0.25    Years: 32.00    Pack years: 8.00    Types: Cigarettes    Quit date: 10/21/1983    Years since quitting: 35.6  . Smokeless tobacco: Never Used  Substance Use Topics  . Alcohol use: Never    Frequency: Never   family history includes CAD in her brother.  Medications: Current Outpatient Medications  Medication Sig Dispense Refill  . AMBULATORY NON FORMULARY MEDICATION Blood pressure monitor, around upper arm, not wrist.  Per patient preference and insurance coverage. 1 Units prn  . AMBULATORY NON FORMULARY MEDICATION Nebulizer device miscellaneous: Please include machine, necessary tubing and masks.  Dispensed per patient preference and insurance coverage.  Diagnosis COPD, pneumonia. 1 Units prn  . ARIPiprazole (ABILIFY) 10 MG tablet Take 10 mg by mouth daily.    Marland Kitchen. atorvastatin (LIPITOR) 40 MG tablet Take 1 tablet (40 mg total) by mouth at bedtime. 90 tablet 3  . benztropine (COGENTIN) 0.5 MG tablet Take 0.5 mg by mouth 2 (two) times daily.    . brompheniramine-pseudoephedrine-DM 30-2-10 MG/5ML syrup Take 2.5-5 mLs by mouth 4 (four) times daily as needed (cough, congestion). 473 mL 0  . clonazepam (KLONOPIN) 2 MG disintegrating tablet Take 1 mg by mouth 3 (three) times daily as needed (anxiety).     Marland Kitchen. diphenoxylate-atropine (LOMOTIL) 2.5-0.025 MG tablet One to 2 tablets by mouth 4 times a day as needed for diarrhea. 10 tablet 0  . DULoxetine (CYMBALTA) 60 MG capsule Take 120 mg by mouth daily.     . fluticasone (FLONASE ALLERGY RELIEF) 50 MCG/ACT nasal spray Place 2 sprays into the nose daily as needed for allergies.    . furosemide (LASIX) 40 MG tablet Take 2 tablets (80 mg total) by mouth daily. (Patient taking differently: Take 40 mg by mouth 2 (two) times daily. ) 180 tablet 3  . HUMALOG KWIKPEN 100 UNIT/ML KwikPen Inject 7 Units into the skin 3 (three) times daily before meals.    . Insulin Pen Needle (PEN NEEDLES) 31G X 8 MM MISC For use with insulin pen qid as  directed, please dispense per patient preference and insurance coverage 200 each 99  . Ipratropium-Albuterol (COMBIVENT RESPIMAT) 20-100 MCG/ACT AERS respimat Inhale 1 puff into the lungs every 6 (six) hours as needed for wheezing. 16 g 3  . ipratropium-albuterol (DUONEB) 0.5-2.5 (3) MG/3ML SOLN Take 3 mLs by nebulization every 2 (two) hours as needed (wheeze, SOB). 60 mL 3  . LANTUS SOLOSTAR 100 UNIT/ML Solostar Pen INJECT 34 UNITS INTO THE SKIN AT BEDTIME. 15 mL 1  . losartan (COZAAR) 50 MG tablet Take 1 tablet (50 mg total) by mouth daily. HOLDING AS OF 04/01/19 D/T HYPOTENSION (DR Tyera Hansley) 90 tablet 3  . Magnesium Oxide 400 (240 Mg) MG TABS Take 1-2 tablets by mouth See admin instructions. 1 tablet in the AM and 2 tablets in the evening    . metFORMIN (GLUCOPHAGE) 850 MG tablet Take 0.5 tablets (425 mg total) by mouth 2 (two) times daily. 180 tablet 3  . Multiple Vitamin (MULTIVITAMIN) capsule Take 1 capsule by mouth daily.    . nitroGLYCERIN (NITROSTAT) 0.4 MG SL tablet Place 1 tablet (0.4 mg total) under the tongue every 5 (five) minutes as needed for chest pain. 25 tablet 3  . ondansetron (ZOFRAN-ODT) 8 MG disintegrating tablet Take 1 tablet (8 mg total) by mouth every 8 (eight) hours as needed for nausea. 60 tablet 3  .  potassium chloride SA (K-DUR,KLOR-CON) 20 MEQ tablet Take 20 mEq by mouth daily.    . pregabalin (LYRICA) 50 MG capsule Take 1 capsule (50 mg total) by mouth 3 (three) times daily as needed. 42 capsule 0  . spironolactone (ALDACTONE) 25 MG tablet Take 1 tablet (25 mg total) by mouth daily. 90 tablet 1  . Vitamin D, Ergocalciferol, (DRISDOL) 1.25 MG (50000 UT) CAPS capsule Take 1 capsule (50,000 Units total) by mouth every 14 (fourteen) days. 12 capsule 0  . albuterol (PROVENTIL) (2.5 MG/3ML) 0.083% nebulizer solution Take 3 mLs by nebulization every 4 (four) hours as needed for shortness of breath. 75 mL 99  . rivaroxaban (XARELTO) 20 MG TABS tablet Take 1 tablet (20 mg total) by mouth daily with supper for 30 days. 30 tablet 6  . rOPINIRole (REQUIP) 2 MG tablet Take 6 mg by mouth at bedtime.      No current facility-administered medications for this visit.    Allergies  Allergen Reactions  . Morphine Itching and Rash  . Ciprofloxacin Other (See Comments)    AMS  . Latex Hives and Other (See Comments)    Other reaction(s): Other (See Comments)  . Promethazine Other (See Comments)  . Ibuprofen   . Penicillins Hives    Did it involve swelling of the face/tongue/throat, SOB, or low BP? No Did it involve sudden or severe rash/hives, skin peeling, or any reaction on the inside of your mouth or nose? No Did you need to seek medical attention at a hospital or doctor's office? No When did it last happen? If all above answers are "NO", may proceed with cephalosporin use.     PDMP not reviewed this encounter. Orders Placed This Encounter  Procedures  . Ambulatory referral to Neurology    Referral Priority:   Routine    Referral Type:   Consultation    Referral Reason:   Specialty Services Required    Requested Specialty:   Neurology    Number of Visits Requested:   1   No orders of the defined types were placed in this encounter.

## 2019-05-31 NOTE — Telephone Encounter (Signed)
Pt's husband left a vm msg requesting how long will Neurologist referral take to complete. As per pt's husband wants to know the time frame because they will be travelling to [place] for a few weeks. Side note - referral was placed this afternoon. Thanks.

## 2019-06-01 ENCOUNTER — Encounter: Payer: Self-pay | Admitting: Osteopathic Medicine

## 2019-06-02 ENCOUNTER — Telehealth: Payer: Self-pay | Admitting: Osteopathic Medicine

## 2019-06-02 LAB — COMPLETE METABOLIC PANEL WITH GFR
AG Ratio: 1.7 (calc) (ref 1.0–2.5)
ALT: 19 U/L (ref 6–29)
AST: 21 U/L (ref 10–35)
Albumin: 3.8 g/dL (ref 3.6–5.1)
Alkaline phosphatase (APISO): 78 U/L (ref 37–153)
BUN/Creatinine Ratio: 13 (calc) (ref 6–22)
BUN: 27 mg/dL — ABNORMAL HIGH (ref 7–25)
CO2: 31 mmol/L (ref 20–32)
Calcium: 10.7 mg/dL — ABNORMAL HIGH (ref 8.6–10.4)
Chloride: 92 mmol/L — ABNORMAL LOW (ref 98–110)
Creat: 2.11 mg/dL — ABNORMAL HIGH (ref 0.60–0.93)
GFR, Est African American: 26 mL/min/{1.73_m2} — ABNORMAL LOW (ref >=60)
GFR, Est Non African American: 23 mL/min/{1.73_m2} — ABNORMAL LOW (ref >=60)
Globulin: 2.3 g/dL (calc) (ref 1.9–3.7)
Glucose, Bld: 165 mg/dL — ABNORMAL HIGH (ref 65–99)
Potassium: 3.5 mmol/L (ref 3.5–5.3)
Sodium: 135 mmol/L (ref 135–146)
Total Bilirubin: 0.7 mg/dL (ref 0.2–1.2)
Total Protein: 6.1 g/dL (ref 6.1–8.1)

## 2019-06-02 LAB — PARATHYROID HORMONE, INTACT (NO CA): PTH: 47 pg/mL (ref 14–64)

## 2019-06-02 LAB — CALCIUM, IONIZED: Calcium, Ion: 5.4 mg/dL (ref 4.8–5.6)

## 2019-06-02 NOTE — Telephone Encounter (Signed)
I called pt and left a Vm for her to call back and schedule a 1 mo f/u 40 minute appt with Dr. Sheppard Coil on her DM

## 2019-06-02 NOTE — Telephone Encounter (Signed)
Referral was sent to [facility] he can call their office to see how soon patient can be seen referral was sent this morning I am not sure of a time frame. The number is [phone] - CF

## 2019-06-03 ENCOUNTER — Telehealth: Payer: Self-pay | Admitting: *Deleted

## 2019-06-03 NOTE — Telephone Encounter (Signed)
Pt's husband left vm stating that they never received a call to get her lumbar mri scheduled.  This was ordered back in June. He said that pt is having an increase in pain and wants to get it done.  I advised him that you would have to reorder it and it would have to be authorized again.

## 2019-06-03 NOTE — Telephone Encounter (Signed)
I am going to route this to Apolonio Schneiders to see if she can re-auth it and then get it scheduled.

## 2019-06-06 ENCOUNTER — Telehealth: Payer: Self-pay

## 2019-06-06 NOTE — Telephone Encounter (Signed)
Patient left VM stating that she checked her BP and it was elevated at 156/88. Patient states that after an hour her blood pressure went "back to normal" but could not recall the reading. I had patient check BP when on the phone with me and it was 137/83 with pulse 92.  Patient already has follow up with Dr Sheppard Coil on 06/08/19. Advised pt to continue checking home Bps and call us if elevated again.  FYI to PCP

## 2019-06-06 NOTE — Telephone Encounter (Signed)
Done

## 2019-06-06 NOTE — Telephone Encounter (Signed)
Pt's husband Fayrene Helper) was provided office number for Neurologist for appt scheduling on 06/01/2019. No other inquiries during call.

## 2019-06-08 ENCOUNTER — Other Ambulatory Visit: Payer: Self-pay

## 2019-06-08 ENCOUNTER — Ambulatory Visit (INDEPENDENT_AMBULATORY_CARE_PROVIDER_SITE_OTHER): Payer: Medicare Other | Admitting: Diagnostic Neuroimaging

## 2019-06-08 ENCOUNTER — Encounter: Payer: Self-pay | Admitting: Diagnostic Neuroimaging

## 2019-06-08 ENCOUNTER — Telehealth: Payer: Self-pay | Admitting: Diagnostic Neuroimaging

## 2019-06-08 ENCOUNTER — Encounter: Payer: Self-pay | Admitting: Osteopathic Medicine

## 2019-06-08 ENCOUNTER — Telehealth: Payer: Self-pay | Admitting: Osteopathic Medicine

## 2019-06-08 ENCOUNTER — Ambulatory Visit (INDEPENDENT_AMBULATORY_CARE_PROVIDER_SITE_OTHER): Payer: Medicare Other | Admitting: Osteopathic Medicine

## 2019-06-08 VITALS — BP 144/66 | HR 90 | Temp 97.5°F | Wt 210.3 lb

## 2019-06-08 VITALS — BP 150/76 | HR 92 | Temp 97.7°F | Ht 60.0 in | Wt 219.2 lb

## 2019-06-08 DIAGNOSIS — N179 Acute kidney failure, unspecified: Secondary | ICD-10-CM

## 2019-06-08 DIAGNOSIS — Z23 Encounter for immunization: Secondary | ICD-10-CM

## 2019-06-08 DIAGNOSIS — R4189 Other symptoms and signs involving cognitive functions and awareness: Secondary | ICD-10-CM | POA: Diagnosis not present

## 2019-06-08 DIAGNOSIS — R443 Hallucinations, unspecified: Secondary | ICD-10-CM

## 2019-06-08 DIAGNOSIS — R441 Visual hallucinations: Secondary | ICD-10-CM | POA: Diagnosis not present

## 2019-06-08 DIAGNOSIS — R413 Other amnesia: Secondary | ICD-10-CM

## 2019-06-08 NOTE — Progress Notes (Signed)
[facility]  PATIENT: Valerie Keller DOB: 07/18/1946  REFERRING CLINICIAN: Elwin MochaAlexander, N HISTORY FROM: patient  REASON FOR VISIT: new consult    HISTORICAL  CHIEF COMPLAINT:  Chief Complaint  Patient presents with  . Hallucinations, cognitive defecits    rm 6 New Pt , HusbandDennie Fetters- Wyman  MMSE 19    HISTORY OF PRESENT ILLNESS:   73 year old female here for evaluation of hallucinations.  Patient has 30-year history of major depression and bipolar disorder.  In the past few years she has had onset of visual hallucinations, seeing shadows and figures in her peripheral vision, especially in dimly lit situations.  Patient has good insight and quickly realizes these figures are not real.  When the lights are turned on brightly these fingers seem to disappear.  Sometimes she sees small children or other people but without a specific identity.  The fingers do not talk to her.  No auditory hallucinations.  [place].  Depression and bipolar disorder are under fairly good control now.  In the past patient has had significant major depression, psychiatry hospitalizations, significant paranoia and delusions in the past.  However these seem to be under good control now.  Patient has been having some mild progressive short-term memory loss, confusion, difficulty with recall of recent events.  Patient also has diabetes, hypertension, obesity, deconditioning, and physical decline.  She uses a wheelchair.  ADLs limited mainly due to physical limitations.  Some ADLs limited due to cognitive and mood limitations.   REVIEW OF SYSTEMS: Full 14 system review of systems performed and negative with exception of: As per HPI.  ALLERGIES: Allergies  Allergen Reactions  . Morphine Itching and Rash  . Ciprofloxacin Other (See Comments)    AMS  . Latex Hives and Other (See Comments)    Other reaction(s): Other (See Comments)  . Promethazine Other  (See Comments)  . Ibuprofen   . Penicillins Hives    Did it involve swelling of the face/tongue/throat, SOB, or low BP? No Did it involve sudden or severe rash/hives, skin peeling, or any reaction on the inside of your mouth or nose? No Did you need to seek medical attention at a hospital or doctor's office? No When did it last happen? If all above answers are "NO", may proceed with cephalosporin use.     HOME MEDICATIONS: Outpatient Medications Prior to Visit  Medication Sig Dispense Refill  . albuterol (PROVENTIL) (2.5 MG/3ML) 0.083% nebulizer solution Take 3 mLs by nebulization every 4 (four) hours as needed for shortness of breath. 75 mL 99  . ARIPiprazole (ABILIFY) 10 MG tablet Take 10 mg by mouth daily.    Marland Kitchen. atorvastatin (LIPITOR) 40 MG tablet Take 1 tablet (40 mg total) by mouth at bedtime. 90 tablet 3  . benztropine (COGENTIN) 0.5 MG tablet Take 0.5 mg by mouth 2 (two) times daily.    . brompheniramine-pseudoephedrine-DM 30-2-10 MG/5ML syrup Take 2.5-5 mLs by mouth 4 (four) times daily as needed (cough, congestion). 473 mL 0  . clonazepam (KLONOPIN) 2 MG disintegrating tablet Take 1 mg by mouth 3 (three) times daily as needed (anxiety).    Marland Kitchen. diphenoxylate-atropine (LOMOTIL) 2.5-0.025 MG tablet One to 2 tablets by mouth 4 times a day as needed for diarrhea. 10 tablet 0  . DULoxetine (CYMBALTA) 60 MG capsule Take 120 mg by mouth daily.     Marland Kitchen. HUMALOG KWIKPEN 100 UNIT/ML KwikPen Inject 7 Units into the skin 3 (three) times daily before  meals.    . Ipratropium-Albuterol (COMBIVENT RESPIMAT) 20-100 MCG/ACT AERS respimat Inhale 1 puff into the lungs every 6 (six) hours as needed for wheezing. 16 g 3  . ipratropium-albuterol (DUONEB) 0.5-2.5 (3) MG/3ML SOLN Take 3 mLs by nebulization every 2 (two) hours as needed (wheeze, SOB). 60 mL 3  . LANTUS SOLOSTAR 100 UNIT/ML Solostar Pen INJECT 34 UNITS INTO THE SKIN AT BEDTIME. 15 mL 1  . Magnesium Oxide 400 (240 Mg) MG TABS Take 1-2 tablets by  mouth See admin instructions. 1 tablet in the AM and 2 tablets in the evening    . nitroGLYCERIN (NITROSTAT) 0.4 MG SL tablet Place 1 tablet (0.4 mg total) under the tongue every 5 (five) minutes as needed for chest pain. 25 tablet 3  . ondansetron (ZOFRAN-ODT) 8 MG disintegrating tablet Take 1 tablet (8 mg total) by mouth every 8 (eight) hours as needed for nausea. 60 tablet 3  . potassium chloride SA (K-DUR,KLOR-CON) 20 MEQ tablet Take 20 mEq by mouth daily.    . pregabalin (LYRICA) 50 MG capsule Take 1 capsule (50 mg total) by mouth 3 (three) times daily as needed. 42 capsule 0  . rivaroxaban (XARELTO) 20 MG TABS tablet Take 1 tablet (20 mg total) by mouth daily with supper for 30 days. 30 tablet 6  . rOPINIRole (REQUIP) 2 MG tablet Take 6 mg by mouth at bedtime.     Marland Kitchen spironolactone (ALDACTONE) 25 MG tablet Take 1 tablet (25 mg total) by mouth daily. 90 tablet 1  . AMBULATORY NON FORMULARY MEDICATION Blood pressure monitor, around upper arm, not wrist.  Per patient preference and insurance coverage. (Patient not taking: Reported on 06/08/2019) 1 Units prn  . AMBULATORY NON FORMULARY MEDICATION Nebulizer device miscellaneous: Please include machine, necessary tubing and masks.  Dispensed per patient preference and insurance coverage.  Diagnosis COPD, pneumonia. (Patient not taking: Reported on 06/08/2019) 1 Units prn  . fluticasone (FLONASE ALLERGY RELIEF) 50 MCG/ACT nasal spray Place 2 sprays into the nose daily as needed for allergies.    . furosemide (LASIX) 40 MG tablet Take 2 tablets (80 mg total) by mouth daily. (Patient not taking: Reported on 06/08/2019) 180 tablet 3  . Insulin Pen Needle (PEN NEEDLES) 31G X 8 MM MISC For use with insulin pen qid as directed, please dispense per patient preference and insurance coverage (Patient not taking: Reported on 06/08/2019) 200 each 99  . losartan (COZAAR) 50 MG tablet Take 1 tablet (50 mg total) by mouth daily. HOLDING AS OF 04/01/19 D/T HYPOTENSION (DR  Sheppard Coil) (Patient not taking: Reported on 06/08/2019) 90 tablet 3  . metFORMIN (GLUCOPHAGE) 850 MG tablet Take 0.5 tablets (425 mg total) by mouth 2 (two) times daily. (Patient not taking: Reported on 06/08/2019) 180 tablet 3  . Multiple Vitamin (MULTIVITAMIN) capsule Take 1 capsule by mouth daily.    . Vitamin D, Ergocalciferol, (DRISDOL) 1.25 MG (50000 UT) CAPS capsule Take 1 capsule (50,000 Units total) by mouth every 14 (fourteen) days. (Patient not taking: Reported on 06/08/2019) 12 capsule 0   No facility-administered medications prior to visit.     PAST MEDICAL HISTORY: Past Medical History:  Diagnosis Date  . Diabetes mellitus without complication ([place])   . Fibromyalgia   . High blood pressure   . High cholesterol   . Lung disease   . Myocardial infarction ([place])   . Nephrolithiasis   . Pulmonary embolus ([place])   . Renal disorder     PAST SURGICAL HISTORY: Past Surgical  History:  Procedure Laterality Date  . ABDOMINAL HYSTERECTOMY    . CHOLECYSTECTOMY    . CORONARY STENT PLACEMENT     x3  . FOOT SURGERY    . KIDNEY SURGERY    . TONSILLECTOMY      FAMILY HISTORY: Family History  Problem Relation Age of Onset  . CAD Brother   . Diabetes Neg Hx   . Cancer Neg Hx     SOCIAL HISTORY: Social History   Socioeconomic History  . Marital status: Married    Spouse name: Dennie FettersWyman  . Number of children: 1  . Years of education: Not on file  . Highest education level: High school graduate  Occupational History    Comment: na  Social Needs  . Financial resource strain: Not on file  . Food insecurity    Worry: Not on file    Inability: Not on file  . Transportation needs    Medical: Not on file    Non-medical: Not on file  Tobacco Use  . Smoking status: Former Smoker    Packs/day: 0.25    Years: 32.00    Pack years: 8.00    Types: Cigarettes    Quit date: 10/21/1983    Years since quitting: 35.6  . Smokeless tobacco: Never Used  Substance and Sexual Activity  .  Alcohol use: Never    Frequency: Never  . Drug use: Never  . Sexual activity: Not Currently  Lifestyle  . Physical activity    Days per week: Not on file    Minutes per session: Not on file  . Stress: Not on file  Relationships  . Social Musicianconnections    Talks on phone: Not on file    Gets together: Not on file    Attends religious service: Not on file    Active member of club or organization: Not on file    Attends meetings of clubs or organizations: Not on file    Relationship status: Not on file  . Intimate partner violence    Fear of current or ex partner: Not on file    Emotionally abused: Not on file    Physically abused: Not on file    Forced sexual activity: Not on file  Other Topics Concern  . Not on file  Social History Narrative   Lives with spouse   Caffeine- coffee, tea, soda- one daily or less     PHYSICAL EXAM  GENERAL EXAM/CONSTITUTIONAL: Vitals:  Vitals:   06/08/19 0858  BP: (!) 150/76  Pulse: 92  Temp: 97.7 F (36.5 C)  Weight: 219 lb 3.2 oz (99.4 kg)  Height: 5' (1.524 m)     Body mass index is 42.81 kg/m. Wt Readings from Last 3 Encounters:  06/08/19 219 lb 3.2 oz (99.4 kg)  05/11/19 213 lb (96.6 kg)  04/26/19 216 lb (98 kg)     Patient is in no distress; well developed, nourished and groomed; neck is supple  CARDIOVASCULAR:  Examination of carotid arteries is normal; no carotid bruits  Regular rate and rhythm, no murmurs  Examination of peripheral vascular system by observation and palpation is normal  EYES:  Ophthalmoscopic exam of optic discs and posterior segments is normal; no papilledema or hemorrhages  No exam data present  MUSCULOSKELETAL:  Gait, strength, tone, movements noted in Neurologic exam below  NEUROLOGIC: MENTAL STATUS:  MMSE - Mini Mental State Exam 06/08/2019  Orientation to time 4  Orientation to Place 2  Registration 3  Attention/ Calculation  1  Recall 1  Language- name 2 objects 2  Language-  repeat 1  Language- follow 3 step command 3  Language- read & follow direction 1  Write a sentence 1  Copy design 0  Total score 19    awake, alert, oriented to person, place and time  recent and remote memory intact  normal attention and concentration  language fluent, comprehension intact, naming intact  fund of knowledge appropriate  CRANIAL NERVE:   2nd - no papilledema on fundoscopic exam  2nd, 3rd, 4th, 6th - pupils equal and reactive to light, visual fields full to confrontation, extraocular muscles intact, no nystagmus  5th - facial sensation symmetric  7th - facial strength symmetric  8th - hearing intact  9th - palate elevates symmetrically, uvula midline  11th - shoulder shrug symmetric  12th - tongue protrusion midline  MOTOR:   normal bulk and tone, DIFFUSE 4/5 strength in the BUE, BLE  SENSORY:   normal and symmetric to light touch, temperature, vibration  COORDINATION:   finger-nose-finger, fine finger movements normal  REFLEXES:   deep tendon reflexes TRACE and symmetric  GAIT/STATION:   IN WHEELCHAIR     DIAGNOSTIC DATA (LABS, IMAGING, TESTING) - I reviewed patient records, labs, notes, testing and imaging myself where available.  Lab Results  Component Value Date   WBC 8.9 04/01/2019   HGB 15.9 (H) 04/01/2019   HCT 48.0 (H) 04/01/2019   MCV 97.2 04/01/2019   PLT 237 04/01/2019      Component Value Date/Time   NA 135 06/01/2019 1026   K 3.5 06/01/2019 1026   CL 92 (L) 06/01/2019 1026   CO2 31 06/01/2019 1026   GLUCOSE 165 (H) 06/01/2019 1026   BUN 27 (H) 06/01/2019 1026   CREATININE 2.11 (H) 06/01/2019 1026   CALCIUM 10.7 (H) 06/01/2019 1026   PROT 6.1 06/01/2019 1026   ALBUMIN 3.9 11/22/2018 1449   AST 21 06/01/2019 1026   ALT 19 06/01/2019 1026   ALKPHOS 69 11/22/2018 1449   BILITOT 0.7 06/01/2019 1026   GFRNONAA 23 (L) 06/01/2019 1026   GFRAA 26 (L) 06/01/2019 1026   No results found for: CHOL, HDL, LDLCALC,  LDLDIRECT, TRIG, CHOLHDL Lab Results  Component Value Date   HGBA1C 6.9 (H) 04/01/2019   No results found for: VITAMINB12 Lab Results  Component Value Date   TSH 1.10 04/01/2019    11/10/18 CT head [I reviewed images myself and agree with interpretation. -VRP]  - negative    ASSESSMENT AND PLAN  73 y.o. 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  Patient has good insight into the hallucinations.  Also with major depression which is under fairly good control.  Ddx: visual hallucinations (due to neurodegenerative dementia vs major depression / bipolar d/o)  1. Visual hallucination   2. Memory loss      PLAN:  - check MRI brain - consider memantine 10mg  at bedtime; increase to twice a day after 1-2 weeks - safety / supervision issues reviewed  Orders Placed This Encounter  Procedures  . MR BRAIN W WO CONTRAST   Return pending test results, for pending if symptoms worsen or fail to improve.    Suanne MarkerVIKRAM R. PENUMALLI, MD 06/08/2019, 9:52 AM Certified in Neurology, Neurophysiology and Neuroimaging  [facility] [address], [address] [place], [place] [id number] [phone]

## 2019-06-08 NOTE — Telephone Encounter (Signed)
LABS

## 2019-06-08 NOTE — Telephone Encounter (Signed)
[facility] [place] [employer] no Josem Kaufmann. They will reach out to the patient to schedule.

## 2019-06-08 NOTE — Progress Notes (Signed)
HPI: Valerie Keller is a 73 y.o. female who  has a past medical history of Diabetes mellitus without complication (HCC), Fibromyalgia, High blood pressure, High cholesterol, Lung disease, Myocardial infarction (HCC), Nephrolithiasis, Pulmonary embolus (HCC), and Renal disorder.  she [facility] to [facility] [place] [facility] today, 06/08/19,  for chief complaint of:  Follow-up labs Hallucinations  Primary reason for today's appointment was to follow-up on abnormal labs.  Significant increase in creatinine/decrease in GFR.  We held losartan, spironolactone, Lasix, metformin.  Patient has noted some increased confusion and there has been some concern from her husband and her psychiatrist with regard to hallucinations though I did not have much in the way of details.  Patient saw neurologist this morning, I reviewed their note it does not look like there is mention of what Valerie Keller told me today: She is not very clear in her description but from what I can gather she is having experiences where at night she will look over at her husband, it seems like part of his face is changing color or caving in such that she thinks he is dying or dead, she also experiences episodes where he is "gone" and someone else is taking care of her.  It seems that she is confusing her husband with someone else.  She states that she has not wanted to tell him this for fear that he might not come back.  She says she trusts me with this information but she did not tell her neurologist or anyone else about any of this.  Patient is accompanied by husband who assists with history-taking.    At today's visit 06/08/19 ... PMH, PSH, FH reviewed and updated as needed.  Current medication list and allergy/intolerance hx reviewed and updated as needed. (See remainder of HPI, ROS, Phys Exam below)   No results found.  Results for orders placed or performed in visit on 06/08/19 (from the past 72 hour(s))   COMPLETE METABOLIC PANEL WITH GFR     Status: Abnormal   Collection Time: 06/08/19 11:30 AM  Result Value Ref Range   Glucose, Bld 167 (H) 65 - 99 mg/dL    Comment: .            Fasting reference interval . For someone without known diabetes, a glucose value >125 mg/dL indicates that they may have diabetes and this should be confirmed with a follow-up test. .    BUN 15 7 - 25 mg/dL   Creat 8.110.99 (H) 9.140.60 - 0.93 mg/dL    Comment: For patients >74 years of age, the reference limit for Creatinine is approximately 13% higher for people identified as African-American. .    GFR, Est Non African American 57 (L) > OR = 60 mL/min/1.7873m2   GFR, Est African American 66 > OR = 60 mL/min/1.6173m2   BUN/Creatinine Ratio 15 6 - 22 (calc)   Sodium 140 135 - 146 mmol/L   Potassium 4.0 3.5 - 5.3 mmol/L   Chloride 103 98 - 110 mmol/L   CO2 27 20 - 32 mmol/L   Calcium 10.0 8.6 - 10.4 mg/dL   Total Protein 5.7 (L) 6.1 - 8.1 g/dL   Albumin 3.6 3.6 - 5.1 g/dL   Globulin 2.1 1.9 - 3.7 g/dL (calc)   AG Ratio 1.7 1.0 - 2.5 (calc)   Total Bilirubin 0.4 0.2 - 1.2 mg/dL   Alkaline phosphatase (APISO) 72 37 - 153 U/L   AST 20 10 - 35 U/L   ALT  26 6 - 29 U/L  Phosphorus     Status: None   Collection Time: 06/08/19 11:30 AM  Result Value Ref Range   Phosphorus 3.4 2.1 - 4.3 mg/dL  TSH     Status: Abnormal   Collection Time: 06/08/19 11:30 AM  Result Value Ref Range   TSH 0.13 (L) 0.40 - 4.50 mIU/L  Urinalysis, Routine w reflex microscopic     Status: Abnormal   Collection Time: 06/08/19 11:30 AM  Result Value Ref Range   Color, Urine YELLOW YELLOW   APPearance TURBID (A) CLEAR   Specific Gravity, Urine 1.012 1.001 - 1.03   pH > OR = 8.5 (A) 5.0 - 8.0   Glucose, UA NEGATIVE NEGATIVE   Bilirubin Urine NEGATIVE NEGATIVE   Ketones, ur NEGATIVE NEGATIVE   Hgb urine dipstick NEGATIVE NEGATIVE   Protein, ur TRACE (A) NEGATIVE   Nitrite NEGATIVE NEGATIVE   Leukocytes,Ua 2+ (A) NEGATIVE   WBC, UA 0-5  0 - 5 /HPF   RBC / HPF 0-2 0 - 2 /HPF   Squamous Epithelial / LPF 0-5 < OR = 5 /HPF   Bacteria, UA NONE SEEN NONE SEEN /HPF   Hyaline Cast NONE SEEN NONE SEEN /LPF  Sodium, urine, random     Status: Abnormal   Collection Time: 06/08/19 11:30 AM  Result Value Ref Range   Sodium, Ur 23 (L) 28 - 272 mmol/L  Creatinine, Urine     Status: None   Collection Time: 06/08/19 11:30 AM  Result Value Ref Range   Creatinine, Urine 60 20 - 275 mg/dL  Magnesium     Status: None   Collection Time: 06/08/19 11:30 AM  Result Value Ref Range   Magnesium 2.1 1.5 - 2.5 mg/dL          ASSESSMENT/PLAN: The primary encounter diagnosis was AKI (acute kidney injury) ([place]). Diagnoses of Hallucination and Cognitive deficits were also pertinent to this visit.   See HPI for patient's description of her episodes/hallucinations.  Seem to revolve around thinking her husband is someone else.  I did discuss this with her husband, he said he was aware that sometimes she did not really seem to trust him or seem to accuse him of being someone else.  He admits that the neurologist is not aware of this information.  Patient does not seem to have good insight into this issue, seems to truly believe that her husband leaves, someone else takes his place, but he always comes back.  Does not have any other caregivers, though she does have physical therapy come to the house.  She would like to get set up with physical therapy here in the building.  Route note to neurology as FYI.  Await labs, thankfully kidney function seems to have improved, will continue to hold nephrotoxic medications for now, may consider alternate blood pressure medications depending on what the numbers are looking like on recheck.      Follow-up plan: Return for recheck depending on lab results  .                                                 ################################################# ################################################# ################################################# #################################################    Current Meds  Medication Sig  . albuterol (PROVENTIL) (2.5 MG/3ML) 0.083% nebulizer solution Take 3 mLs by nebulization every 4 (four) hours as needed for shortness of  breath.  . AMBULATORY NON FORMULARY MEDICATION Blood pressure monitor, around upper arm, not wrist.  Per patient preference and insurance coverage.  . AMBULATORY NON FORMULARY MEDICATION Nebulizer device miscellaneous: Please include machine, necessary tubing and masks.  Dispensed per patient preference and insurance coverage.  Diagnosis COPD, pneumonia.  . ARIPiprazole (ABILIFY) 10 MG tablet Take 10 mg by mouth daily.  Marland Kitchen. atorvastatin (LIPITOR) 40 MG tablet Take 1 tablet (40 mg total) by mouth at bedtime.  . benztropine (COGENTIN) 0.5 MG tablet Take 0.5 mg by mouth 2 (two) times daily.  . brompheniramine-pseudoephedrine-DM 30-2-10 MG/5ML syrup Take 2.5-5 mLs by mouth 4 (four) times daily as needed (cough, congestion).  . clonazepam (KLONOPIN) 2 MG disintegrating tablet Take 1 mg by mouth 3 (three) times daily as needed (anxiety).  Marland Kitchen. diphenoxylate-atropine (LOMOTIL) 2.5-0.025 MG tablet One to 2 tablets by mouth 4 times a day as needed for diarrhea.  . DULoxetine (CYMBALTA) 60 MG capsule Take 120 mg by mouth daily.   . fluticasone (FLONASE ALLERGY RELIEF) 50 MCG/ACT nasal spray Place 2 sprays into the nose daily as needed for allergies.  . furosemide (LASIX) 40 MG tablet Take 2 tablets (80 mg total) by mouth daily.  Marland Kitchen. HUMALOG KWIKPEN 100 UNIT/ML KwikPen Inject 7 Units into the skin 3 (three) times daily before meals.  . Insulin Pen Needle (PEN NEEDLES) 31G X 8 MM MISC For use with insulin pen qid as directed, please dispense per  patient preference and insurance coverage  . Ipratropium-Albuterol (COMBIVENT RESPIMAT) 20-100 MCG/ACT AERS respimat Inhale 1 puff into the lungs every 6 (six) hours as needed for wheezing.  Marland Kitchen. ipratropium-albuterol (DUONEB) 0.5-2.5 (3) MG/3ML SOLN Take 3 mLs by nebulization every 2 (two) hours as needed (wheeze, SOB).  Marland Kitchen. LANTUS SOLOSTAR 100 UNIT/ML Solostar Pen INJECT 34 UNITS INTO THE SKIN AT BEDTIME.  Marland Kitchen. losartan (COZAAR) 50 MG tablet Take 1 tablet (50 mg total) by mouth daily. HOLDING AS OF 04/01/19 D/T HYPOTENSION (DR Lyn HollingsheadALEXANDER)  . Magnesium Oxide 400 (240 Mg) MG TABS Take 1-2 tablets by mouth See admin instructions. 1 tablet in the AM and 2 tablets in the evening  . metFORMIN (GLUCOPHAGE) 850 MG tablet Take 0.5 tablets (425 mg total) by mouth 2 (two) times daily.  . Multiple Vitamin (MULTIVITAMIN) capsule Take 1 capsule by mouth daily.  . nitroGLYCERIN (NITROSTAT) 0.4 MG SL tablet Place 1 tablet (0.4 mg total) under the tongue every 5 (five) minutes as needed for chest pain.  Marland Kitchen. ondansetron (ZOFRAN-ODT) 8 MG disintegrating tablet Take 1 tablet (8 mg total) by mouth every 8 (eight) hours as needed for nausea.  . potassium chloride SA (K-DUR,KLOR-CON) 20 MEQ tablet Take 20 mEq by mouth daily.  . pregabalin (LYRICA) 50 MG capsule Take 1 capsule (50 mg total) by mouth 3 (three) times daily as needed.  . rivaroxaban (XARELTO) 20 MG TABS tablet Take 1 tablet (20 mg total) by mouth daily with supper for 30 days.  Marland Kitchen. rOPINIRole (REQUIP) 2 MG tablet Take 6 mg by mouth at bedtime.   Marland Kitchen. spironolactone (ALDACTONE) 25 MG tablet Take 1 tablet (25 mg total) by mouth daily.  . Vitamin D, Ergocalciferol, (DRISDOL) 1.25 MG (50000 UT) CAPS capsule Take 1 capsule (50,000 Units total) by mouth every 14 (fourteen) days.    Allergies  Allergen Reactions  . Morphine Itching and Rash  . Ciprofloxacin Other (See Comments)    AMS  . Latex Hives and Other (See Comments)    Other reaction(s): Other (See Comments)  .  Promethazine Other (See Comments)  . Ibuprofen   . Penicillins Hives    Did it involve swelling of the face/tongue/throat, SOB, or low BP? No Did it involve sudden or severe rash/hives, skin peeling, or any reaction on the inside of your mouth or nose? No Did you need to seek medical attention at a hospital or doctor's office? No When did it last happen? If all above answers are "NO", may proceed with cephalosporin use.        Review of Systems:  Constitutional: No fever/chills  Cardiac: No  chest pain, No  pressure, No palpitations  Respiratory:  No  shortness of breath. No  Cough  Gastrointestinal: No  abdominal pain, no change on bowel habits  Musculoskeletal: No new myalgia/arthralgia  Neurologic: No  weakness, No  Dizziness  Psychiatric: +concerns with depression, +concerns with anxiety  Exam:  BP (!) 144/66 (BP Location: Left Arm, Patient Position: Sitting, Cuff Size: Normal)   Pulse 90   Temp (!) 97.5 F (36.4 C) (Oral)   Wt 210 lb 4.8 oz (95.4 kg)   BMI 41.07 kg/m   Constitutional: VS see above. General Appearance: alert, well-developed, well-nourished, NAD  Eyes: Normal lids and conjunctive, non-icteric sclera  Neck: No masses, trachea midline.   Respiratory: Normal respiratory effort. no wheeze, no rhonchi, no rales  Cardiovascular: S1/S2 normal, no murmur, no rub/gallop auscultated. RRR.   Neurological: Normal balance/coordination. No tremor.  Skin: warm, dry, intact.   Psychiatric: Normal judgment/insight. Normal mood and affect. Oriented x3.       Visit summary with medication list and pertinent instructions was printed for patient to review, patient was advised to alert us if any updates are needed. All questions at time of visit were answered - patient instructed to contact office with any additional concerns. ER/RTC precautions were reviewed with the patient and understanding verbalized.   Note: Total time spent 40 minutes, greater than  50% of the visit was spent face-to-face counseling and coordinating care for the following: The primary encounter diagnosis was AKI (acute kidney injury) (HCC). Diagnoses of Hallucination and Cognitive deficits were also pertinent to this visit.Marland Kitchen.  Please note: voice recognition software was used to produce this document, and typos may escape review. Please contact Dr. Lyn HollingsheadAlexander for any needed clarifications.    Follow up plan: Return for recheck depending on lab results .

## 2019-06-08 NOTE — Telephone Encounter (Signed)
Noted, thanks!

## 2019-06-09 LAB — URINALYSIS, ROUTINE W REFLEX MICROSCOPIC
Bacteria, UA: NONE SEEN /HPF
Bilirubin Urine: NEGATIVE
Glucose, UA: NEGATIVE
Hgb urine dipstick: NEGATIVE
Hyaline Cast: NONE SEEN /LPF
Ketones, ur: NEGATIVE
Nitrite: NEGATIVE
Specific Gravity, Urine: 1.012 (ref 1.001–1.03)
pH: >=8.5 — AB (ref 5.0–8.0)

## 2019-06-09 LAB — CREATININE, URINE, RANDOM: Creatinine, Urine: 60 mg/dL (ref 20–275)

## 2019-06-09 LAB — MAGNESIUM: Magnesium: 2.1 mg/dL (ref 1.5–2.5)

## 2019-06-09 LAB — COMPLETE METABOLIC PANEL WITH GFR
AG Ratio: 1.7 (calc) (ref 1.0–2.5)
ALT: 26 U/L (ref 6–29)
AST: 20 U/L (ref 10–35)
Albumin: 3.6 g/dL (ref 3.6–5.1)
Alkaline phosphatase (APISO): 72 U/L (ref 37–153)
BUN/Creatinine Ratio: 15 (calc) (ref 6–22)
BUN: 15 mg/dL (ref 7–25)
CO2: 27 mmol/L (ref 20–32)
Calcium: 10 mg/dL (ref 8.6–10.4)
Chloride: 103 mmol/L (ref 98–110)
Creat: 0.99 mg/dL — ABNORMAL HIGH (ref 0.60–0.93)
GFR, Est African American: 66 mL/min/{1.73_m2} (ref >=60)
GFR, Est Non African American: 57 mL/min/{1.73_m2} — ABNORMAL LOW (ref >=60)
Globulin: 2.1 g/dL (calc) (ref 1.9–3.7)
Glucose, Bld: 167 mg/dL — ABNORMAL HIGH (ref 65–99)
Potassium: 4 mmol/L (ref 3.5–5.3)
Sodium: 140 mmol/L (ref 135–146)
Total Bilirubin: 0.4 mg/dL (ref 0.2–1.2)
Total Protein: 5.7 g/dL — ABNORMAL LOW (ref 6.1–8.1)

## 2019-06-09 LAB — SODIUM, URINE, RANDOM: Sodium, Ur: 23 mmol/L — ABNORMAL LOW (ref 28–272)

## 2019-06-09 LAB — PHOSPHORUS: Phosphorus: 3.4 mg/dL (ref 2.1–4.3)

## 2019-06-09 LAB — TSH: TSH: 0.13 mIU/L — ABNORMAL LOW (ref 0.40–4.50)

## 2019-06-10 ENCOUNTER — Other Ambulatory Visit: Payer: Self-pay | Admitting: Osteopathic Medicine

## 2019-06-10 NOTE — Telephone Encounter (Signed)
With her recent elevated blood pressure and hallucinations I do not feel comfortable refilling a Sudafed product.  So medication was declined.

## 2019-06-10 NOTE — Telephone Encounter (Signed)
This refill cannot be delegated  Last office visit was 06/08/2019  Requested Prescriptions  Pending Prescriptions Disp Refills  . brompheniramine-pseudoephedrine-DM 30-2-10 MG/5ML syrup [Pharmacy Med Name: BROMPHENIR-PSEUDOEPHED-DM SYR] 473 mL 0    Sig: TAKE 2.5-5 MLS BY MOUTH 4 (FOUR) TIMES DAILY AS NEEDED (COUGH, CONGESTION).     Not Delegated - Ear, Nose, and Throat:  Combination Products with Pseudoephedrine Failed - 06/10/2019  9:44 AM      Failed - This refill cannot be delegated      Failed - Last BP in normal range    BP Readings from Last 1 Encounters:  06/08/19 (!) 144/66         Passed - Valid encounter within last 12 months    Recent Outpatient Visits          2 days ago AKI (acute kidney injury) ([place])   [facility], [place], DO   1 week ago [place], Lanelle Bal, DO   1 month ago Primary osteoarthritis of left knee   [facility], Gwen Her, MD   2 months ago Chronic fatigue   [facility], Lanelle Bal, DO   2 months ago UTI symptoms   [facility], Lanelle Bal, DO      Future Appointments            In 2 weeks Emeterio Reeve, [facility]   In 2 months [place], Denice Bors, MD [place]

## 2019-06-13 ENCOUNTER — Telehealth: Payer: Self-pay

## 2019-06-13 ENCOUNTER — Other Ambulatory Visit: Payer: Medicare Other

## 2019-06-13 NOTE — Telephone Encounter (Signed)
Agreed, thanks

## 2019-06-13 NOTE — Telephone Encounter (Signed)
Pt left a vm msg stating she is now having fever and pain around her Kidney areas. She stated that provider told her to call into the office with an update. Pls advise, thanks.

## 2019-06-13 NOTE — Telephone Encounter (Signed)
If there is concern for fever, along with back pain that might be concerning for urinary tract infection, she should get checked out in the emergency room or urgent care.

## 2019-06-14 ENCOUNTER — Encounter: Payer: Self-pay | Admitting: Osteopathic Medicine

## 2019-06-14 ENCOUNTER — Other Ambulatory Visit: Payer: Self-pay

## 2019-06-14 ENCOUNTER — Ambulatory Visit (INDEPENDENT_AMBULATORY_CARE_PROVIDER_SITE_OTHER): Payer: Medicare Other | Admitting: Osteopathic Medicine

## 2019-06-14 ENCOUNTER — Other Ambulatory Visit: Payer: Self-pay | Admitting: Osteopathic Medicine

## 2019-06-14 VITALS — BP 152/84 | HR 90 | Temp 97.8°F | Wt 220.9 lb

## 2019-06-14 DIAGNOSIS — I152 Hypertension secondary to endocrine disorders: Secondary | ICD-10-CM

## 2019-06-14 DIAGNOSIS — I10 Essential (primary) hypertension: Secondary | ICD-10-CM | POA: Diagnosis not present

## 2019-06-14 DIAGNOSIS — E1159 Type 2 diabetes mellitus with other circulatory complications: Secondary | ICD-10-CM | POA: Diagnosis not present

## 2019-06-14 DIAGNOSIS — R300 Dysuria: Secondary | ICD-10-CM

## 2019-06-14 LAB — POCT URINALYSIS DIPSTICK
Bilirubin, UA: NEGATIVE
Glucose, UA: NEGATIVE
Ketones, UA: NEGATIVE
Nitrite, UA: NEGATIVE
Protein, UA: NEGATIVE
Spec Grav, UA: 1.015 (ref 1.010–1.025)
Urobilinogen, UA: 0.2 E.U./dL
pH, UA: 7 (ref 5.0–8.0)

## 2019-06-14 MED ORDER — LOSARTAN POTASSIUM 50 MG PO TABS: 50.0000 mg | ORAL_TABLET | Freq: Every day | ORAL | 3 refills | Status: AC

## 2019-06-14 MED ORDER — PSEUDOEPH-BROMPHEN-DM 30-2-10 MG/5ML PO SYRP: 2.5000 mL | ORAL_SOLUTION | Freq: Four times a day (QID) | ORAL | 0 refills | Status: DC | PRN

## 2019-06-14 MED ORDER — FUROSEMIDE 40 MG PO TABS: 40.0000 mg | ORAL_TABLET | Freq: Every day | ORAL | 3 refills | Status: AC

## 2019-06-14 NOTE — Addendum Note (Signed)
Addended by: Mertha Finders on: 06/14/2019 09:43 AM   Modules accepted: Orders

## 2019-06-14 NOTE — Patient Instructions (Addendum)
Plan:  Await further urine testing but this sounds potentially like you are having some pain from postmenopausal skin irritation in the vaginal/urethral area, less likely to be a UTI but we will know more once we have the blood work.  Plan to restart the losartan 50 mg daily for blood pressure, restart the Lasix at 40 mg daily.

## 2019-06-14 NOTE — Telephone Encounter (Signed)
Called pt - spoke to her husband. Informed that pt has an appt w/provider at 230 pm today. No other inquiries during call.

## 2019-06-14 NOTE — Progress Notes (Signed)
HPI: Valerie Keller is a 73 y.o. female who  has a past medical history of Diabetes mellitus without complication ([place]), Fibromyalgia, High blood pressure, High cholesterol, Lung disease, Myocardial infarction ([place]), Nephrolithiasis, Pulmonary embolus ([place]), and Renal disorder.  she presents to [facility] today, 06/14/19,  for chief complaint of:  Concerned about kidney/UTI  Patient reports concern for possible kidney infection, reports pain on left lower back when she made the appointment but when asked directly about back or her side pain, she denies it at the time of visit.  States is more in the vaginal area, she will occasionally, but not with every urination, feels sharp pain around the urethra.  She is also pretty concerned that her urine has been pale lately.  We had held a few of her antihypertensives, blood pressure has been a bit above goal, renal function was rechecked and was normal, patient would like to restart something for blood pressure and to help with lower extremity swelling    At today's visit 06/14/19 ... PMH, PSH, FH reviewed and updated as needed.  Current medication list and allergy/intolerance hx reviewed and updated as needed. (See remainder of HPI, ROS, Phys Exam below)   No results found.  Results for orders placed or performed in visit on 06/14/19 (from the past 72 hour(s))  POCT Urinalysis Dipstick     Status: Abnormal   Collection Time: 06/14/19  2:57 PM  Result Value Ref Range   Color, UA YELLOW    Clarity, UA CLEAR    Glucose, UA Negative Negative   Bilirubin, UA Negative    Ketones, UA Negative    Spec Grav, UA 1.015 1.010 - 1.025   Blood, UA Trace-Intact    pH, UA 7.0 5.0 - 8.0   Protein, UA Negative Negative   Urobilinogen, UA 0.2 0.2 or 1.0 E.U./dL   Nitrite, UA Negative    Leukocytes, UA Small (1+) (A) Negative   Appearance     Odor            ASSESSMENT/PLAN: The primary encounter diagnosis  was Dysuria. A diagnosis of Hypertension associated with diabetes ([place]) was also pertinent to this visit.   Patient declined pelvic exam, symptoms seem more consistent with vaginal/urethral irritation due to postmenopausal changes but I of course cannot confirm this without a physical exam.  Urine specimen today was underwhelming, no strong evidence of infection, await additional testing, patient advised to follow-up for pelvic exam if symptoms persist  Orders Placed This Encounter  Procedures  . Urine Culture  . Urinalysis, microscopic only  . POCT Urinalysis Dipstick     Meds ordered this encounter  Medications  . brompheniramine-pseudoephedrine-DM 30-2-10 MG/5ML syrup    Sig: Take 2.5-5 mLs by mouth 4 (four) times daily as needed (cough, congestion).    Dispense:  473 mL    Refill:  0  . furosemide (LASIX) 40 MG tablet    Sig: Take 1 tablet (40 mg total) by mouth daily. As needed for swelling    Dispense:  180 tablet    Refill:  3  . losartan (COZAAR) 50 MG tablet    Sig: Take 1 tablet (50 mg total) by mouth daily.    Dispense:  90 tablet    Refill:  3    Patient Instructions  Plan:  Await further urine testing but this sounds potentially like you are having some pain from postmenopausal skin irritation in the vaginal/urethral area, less likely to be a  UTI but we will know more once we have the blood work.  Plan to restart the losartan 50 mg daily for blood pressure, restart the Lasix at 40 mg daily.        Follow-up plan: Return for RECHECK PENDING RESULTS / IF WORSE OR CHANGE.                                                 ################################################# ################################################# ################################################# #################################################    Current Meds  Medication Sig  . AMBULATORY NON FORMULARY MEDICATION Blood pressure monitor,  around upper arm, not wrist.  Per patient preference and insurance coverage.  . AMBULATORY NON FORMULARY MEDICATION Nebulizer device miscellaneous: Please include machine, necessary tubing and masks.  Dispensed per patient preference and insurance coverage.  Diagnosis COPD, pneumonia.  . ARIPiprazole (ABILIFY) 10 MG tablet Take 10 mg by mouth daily.  Marland Kitchen atorvastatin (LIPITOR) 40 MG tablet Take 1 tablet (40 mg total) by mouth at bedtime.  . benztropine (COGENTIN) 0.5 MG tablet Take 0.5 mg by mouth 2 (two) times daily.  . brompheniramine-pseudoephedrine-DM 30-2-10 MG/5ML syrup Take 2.5-5 mLs by mouth 4 (four) times daily as needed (cough, congestion).  . clonazepam (KLONOPIN) 2 MG disintegrating tablet Take 1 mg by mouth 3 (three) times daily as needed (anxiety).  Marland Kitchen diphenoxylate-atropine (LOMOTIL) 2.5-0.025 MG tablet One to 2 tablets by mouth 4 times a day as needed for diarrhea.  . DULoxetine (CYMBALTA) 60 MG capsule Take 120 mg by mouth daily.   . fluticasone (FLONASE ALLERGY RELIEF) 50 MCG/ACT nasal spray Place 2 sprays into the nose daily as needed for allergies.  . furosemide (LASIX) 40 MG tablet Take 1 tablet (40 mg total) by mouth daily. As needed for swelling  . HUMALOG KWIKPEN 100 UNIT/ML KwikPen Inject 7 Units into the skin 3 (three) times daily before meals.  . Insulin Pen Needle (PEN NEEDLES) 31G X 8 MM MISC For use with insulin pen qid as directed, please dispense per patient preference and insurance coverage  . Ipratropium-Albuterol (COMBIVENT RESPIMAT) 20-100 MCG/ACT AERS respimat Inhale 1 puff into the lungs every 6 (six) hours as needed for wheezing.  Marland Kitchen ipratropium-albuterol (DUONEB) 0.5-2.5 (3) MG/3ML SOLN Take 3 mLs by nebulization every 2 (two) hours as needed (wheeze, SOB).  Marland Kitchen LANTUS SOLOSTAR 100 UNIT/ML Solostar Pen INJECT 34 UNITS INTO THE SKIN AT BEDTIME.  Marland Kitchen losartan (COZAAR) 50 MG tablet Take 1 tablet (50 mg total) by mouth daily.  . Magnesium Oxide 400 (240 Mg) MG TABS Take  1-2 tablets by mouth See admin instructions. 1 tablet in the AM and 2 tablets in the evening  . Multiple Vitamin (MULTIVITAMIN) capsule Take 1 capsule by mouth daily.  . nitroGLYCERIN (NITROSTAT) 0.4 MG SL tablet Place 1 tablet (0.4 mg total) under the tongue every 5 (five) minutes as needed for chest pain.  Marland Kitchen ondansetron (ZOFRAN-ODT) 8 MG disintegrating tablet Take 1 tablet (8 mg total) by mouth every 8 (eight) hours as needed for nausea.  . potassium chloride SA (K-DUR,KLOR-CON) 20 MEQ tablet Take 20 mEq by mouth daily.  . pregabalin (LYRICA) 50 MG capsule Take 1 capsule (50 mg total) by mouth 3 (three) times daily as needed.  . Vitamin D, Ergocalciferol, (DRISDOL) 1.25 MG (50000 UT) CAPS capsule Take 1 capsule (50,000 Units total) by mouth every 14 (fourteen) days.  . [DISCONTINUED] brompheniramine-pseudoephedrine-DM 30-2-10  MG/5ML syrup Take 2.5-5 mLs by mouth 4 (four) times daily as needed (cough, congestion).  . [DISCONTINUED] furosemide (LASIX) 40 MG tablet Take 2 tablets (80 mg total) by mouth daily.  . [DISCONTINUED] losartan (COZAAR) 50 MG tablet Take 1 tablet (50 mg total) by mouth daily. HOLDING AS OF 04/01/19 D/T HYPOTENSION (DR Lyn HollingsheadALEXANDER)  . [DISCONTINUED] metFORMIN (GLUCOPHAGE) 850 MG tablet Take 0.5 tablets (425 mg total) by mouth 2 (two) times daily.  . [DISCONTINUED] spironolactone (ALDACTONE) 25 MG tablet Take 1 tablet (25 mg total) by mouth daily.    Allergies  Allergen Reactions  . Morphine Itching and Rash  . Ciprofloxacin Other (See Comments)    AMS  . Latex Hives and Other (See Comments)    Other reaction(s): Other (See Comments)  . Promethazine Other (See Comments)  . Ibuprofen   . Penicillins Hives    Did it involve swelling of the face/tongue/throat, SOB, or low BP? No Did it involve sudden or severe rash/hives, skin peeling, or any reaction on the inside of your mouth or nose? No Did you need to seek medical attention at a hospital or doctor's office? No When  did it last happen? If all above answers are "NO", may proceed with cephalosporin use.        Review of Systems:  Constitutional: No recent illness  Musculoskeletal: No new myalgia/arthralgia  Skin: No  Rash  Neurologic: No  weakness, No  Dizziness   Exam:  BP (!) 152/84 (BP Location: Left Arm, Patient Position: Sitting, Cuff Size: Normal)   Pulse 90   Temp 97.8 F (36.6 C) (Oral)   Wt 220 lb 14.4 oz (100.2 kg)   BMI 43.14 kg/m   Constitutional: VS see above. General Appearance: alert, well-developed, well-nourished, NAD  Eyes: Normal lids and conjunctive, non-icteric sclera  Neck: No masses, trachea midline.   Respiratory: Normal respiratory effort. no wheeze, no rhonchi, no rales  Cardiovascular: S1/S2 normal, no murmur, no rub/gallop auscultated. RRR.   Neurological: Normal balance/coordination. No tremor.  Skin: warm, dry, intact.   Psychiatric: Normal judgment/insight. Normal mood and affect. Oriented x3.       Visit summary with medication list and pertinent instructions was printed for patient to review, patient was advised to alert us if any updates are needed. All questions at time of visit were answered - patient instructed to contact office with any additional concerns. ER/RTC precautions were reviewed with the patient and understanding verbalized.   Note: Total time spent 25 minutes, greater than 50% of the visit was spent face-to-face counseling and coordinating care for the following: The primary encounter diagnosis was Dysuria. A diagnosis of Hypertension associated with diabetes (HCC) was also pertinent to this visit.Marland Kitchen.  Please note: voice recognition software was used to produce this document, and typos may escape review. Please contact Dr. Lyn HollingsheadAlexander for any needed clarifications.    Follow up plan: Return for RECHECK PENDING RESULTS / IF WORSE OR CHANGE.

## 2019-06-16 LAB — URINE CULTURE
MICRO NUMBER:: 810082
Result:: NO GROWTH
SPECIMEN QUALITY:: ADEQUATE

## 2019-06-16 LAB — URINALYSIS, MICROSCOPIC ONLY
Bacteria, UA: NONE SEEN /HPF
Hyaline Cast: NONE SEEN /LPF
Squamous Epithelial / LPF: NONE SEEN /HPF (ref <=5)

## 2019-06-20 ENCOUNTER — Ambulatory Visit: Payer: Medicare Other

## 2019-06-20 ENCOUNTER — Telehealth: Payer: Self-pay | Admitting: *Deleted

## 2019-06-20 ENCOUNTER — Other Ambulatory Visit: Payer: Self-pay

## 2019-06-20 ENCOUNTER — Ambulatory Visit (INDEPENDENT_AMBULATORY_CARE_PROVIDER_SITE_OTHER): Payer: Medicare Other

## 2019-06-20 ENCOUNTER — Other Ambulatory Visit: Payer: Self-pay | Admitting: Sports Medicine

## 2019-06-20 DIAGNOSIS — M4807 Spinal stenosis, lumbosacral region: Secondary | ICD-10-CM

## 2019-06-20 DIAGNOSIS — M5416 Radiculopathy, lumbar region: Secondary | ICD-10-CM | POA: Diagnosis not present

## 2019-06-20 MED ORDER — PREGABALIN 50 MG PO CAPS: 50.0000 mg | ORAL_CAPSULE | Freq: Three times a day (TID) | ORAL | 0 refills | Status: DC | PRN

## 2019-06-20 NOTE — Telephone Encounter (Signed)
Received call from Richardson Landry, [place] who stated the patient was there for MRI lumbar spine without contrast ordered by PCP and for MRI brain with/without. She was able to complete lumbar spine but he noted her GFR, creatinine values were "all over the place". He discussed with MD and stated he needed to know what her kidney issues are. I advised him of her two visits with PCP since her visit with Dr Leta Baptist. I advised Dr Leta Baptist saw her for memory issues. He stated he would call PCP to find out about kidney. Her MRI brain was rescheduled for next week; she will need repeat lab within 24 hours of MRI. He wanted me to let Dr Leta Baptist know. I advised I will inform. Richardson Landry  verbalized understanding, appreciation. MRI brain is now schedule for 06/28/19.

## 2019-06-21 ENCOUNTER — Telehealth: Payer: Self-pay

## 2019-06-21 NOTE — Telephone Encounter (Signed)
Richardson Landry from Imaging called stating that pt completed Lumbar spine, but was not able to do MRI brain with/wo contrast. As per Richardson Landry, pt's GFR, creatinine levels were all over the place and wanted to know what her kidney issues were. Pt has been rescheduled for 06/28/19. Richardson Landry stated that provider may contact imaging for additional inquiries at [phone].   To provider: pls review telephone note from 06/20/19.

## 2019-06-24 NOTE — Telephone Encounter (Signed)
I think she be fine to go ahead and get the MRI with contrast.  Her GFR has been relatively stable, decreased a few weeks ago, I adjusted her medications and it has rebounded back to baseline.

## 2019-06-24 NOTE — Telephone Encounter (Signed)
Called and LM @ 3:45pm for patient to call back about having MRI.

## 2019-06-28 ENCOUNTER — Ambulatory Visit (INDEPENDENT_AMBULATORY_CARE_PROVIDER_SITE_OTHER): Payer: Medicare Other

## 2019-06-28 ENCOUNTER — Other Ambulatory Visit: Payer: Self-pay

## 2019-06-28 ENCOUNTER — Telehealth: Payer: Self-pay

## 2019-06-28 DIAGNOSIS — I10 Essential (primary) hypertension: Secondary | ICD-10-CM

## 2019-06-28 DIAGNOSIS — R413 Other amnesia: Secondary | ICD-10-CM

## 2019-06-28 LAB — COMPLETE METABOLIC PANEL WITH GFR
AG Ratio: 1.5 (calc) (ref 1.0–2.5)
ALT: 29 U/L (ref 6–29)
AST: 34 U/L (ref 10–35)
Albumin: 3.6 g/dL (ref 3.6–5.1)
Alkaline phosphatase (APISO): 79 U/L (ref 37–153)
BUN: 10 mg/dL (ref 7–25)
CO2: 31 mmol/L (ref 20–32)
Calcium: 9.3 mg/dL (ref 8.6–10.4)
Chloride: 102 mmol/L (ref 98–110)
Creat: 0.88 mg/dL (ref 0.60–0.93)
GFR, Est African American: 75 mL/min/{1.73_m2} (ref >=60)
GFR, Est Non African American: 65 mL/min/{1.73_m2} (ref >=60)
Globulin: 2.4 g/dL (calc) (ref 1.9–3.7)
Glucose, Bld: 157 mg/dL — ABNORMAL HIGH (ref 65–99)
Potassium: 3.6 mmol/L (ref 3.5–5.3)
Sodium: 138 mmol/L (ref 135–146)
Total Bilirubin: 0.9 mg/dL (ref 0.2–1.2)
Total Protein: 6 g/dL — ABNORMAL LOW (ref 6.1–8.1)

## 2019-06-28 MED ORDER — GADOBUTROL 1 MMOL/ML IV SOLN
10.0000 mL | Freq: Once | INTRAVENOUS | Status: AC | PRN
  Administered 2019-06-28: 16:00:00 10 mL via INTRAVENOUS

## 2019-06-28 NOTE — Telephone Encounter (Signed)
Thanks

## 2019-06-28 NOTE — Telephone Encounter (Signed)
Radiologist required labs be done same-day. STAT labs ordered, patient and provider both advised

## 2019-06-28 NOTE — Telephone Encounter (Signed)
Per radiologist, patient needs sam-day labs for imaging.   STAT labs ordered, imaging contacting patient to get these completed.  Note to PCP

## 2019-06-29 ENCOUNTER — Encounter: Payer: Self-pay | Admitting: Osteopathic Medicine

## 2019-06-29 ENCOUNTER — Telehealth: Payer: Self-pay | Admitting: *Deleted

## 2019-06-29 ENCOUNTER — Ambulatory Visit (INDEPENDENT_AMBULATORY_CARE_PROVIDER_SITE_OTHER): Payer: Medicare Other | Admitting: Osteopathic Medicine

## 2019-06-29 VITALS — BP 164/95 | HR 90 | Temp 98.1°F | Wt 218.5 lb

## 2019-06-29 DIAGNOSIS — N179 Acute kidney failure, unspecified: Secondary | ICD-10-CM

## 2019-06-29 DIAGNOSIS — I10 Essential (primary) hypertension: Secondary | ICD-10-CM | POA: Diagnosis not present

## 2019-06-29 DIAGNOSIS — R4189 Other symptoms and signs involving cognitive functions and awareness: Secondary | ICD-10-CM

## 2019-06-29 DIAGNOSIS — R443 Hallucinations, unspecified: Secondary | ICD-10-CM | POA: Diagnosis not present

## 2019-06-29 NOTE — Patient Instructions (Addendum)
MRI looked ok, pllease call Dr Gladstone Lighter office to see what their follow-up plan is.   Please call and let me know what dose of Rosuvastatin you're taking. I have removed the Atorvastatin form your list.  Please start back on the Losartan 50 mg daily and Lasix 40 mg once daily, can take an addition 40 mg again around lunch or dinner if swelling is still a problem.

## 2019-06-29 NOTE — Telephone Encounter (Signed)
Attempted to reach husband with MRI results. No answer, voice MB not set up.

## 2019-06-29 NOTE — Progress Notes (Signed)
HPI: Valerie Keller is a 73 y.o. female who  has a past medical history of Diabetes mellitus without complication ([place]), Fibromyalgia, High blood pressure, High cholesterol, Lung disease, Myocardial infarction ([place]), Nephrolithiasis, Pulmonary embolus ([place]), and Renal disorder.  she presents to [facility] today, 06/29/19,  for chief complaint of:  Concern about medicines  There is some confusion over which medication she should be taking.  I had specifically written on their patient instructions at last visit to restart the losartan and the Lasix at a bit of a lower dose.  She is not on either of these medicines.  Husband also reports that she is taking rosuvastatin, not atorvastatin, but he does not know the dose of this medicine.  Patient otherwise feeling fine since last visit other than lower extremity swelling on days that she does not take the Lasix.  Recently had her MRI of the brain done, asks for results on this.      At today's visit 06/29/19 ... PMH, PSH, FH reviewed and updated as needed.  Current medication list and allergy/intolerance hx reviewed and updated as needed. (See remainder of HPI, ROS, Phys Exam below)   Mr Jeri Cos Wo Contrast  Result Date: 06/29/2019 CLINICAL DATA:  Hallucination with memory loss for 1 year EXAM: MRI HEAD WITHOUT AND WITH CONTRAST TECHNIQUE: Multiplanar, multiecho pulse sequences of the brain and surrounding structures were obtained without and with intravenous contrast. CONTRAST:  83mL GADAVIST GADOBUTROL 1 MMOL/ML IV SOLN COMPARISON:  Head CT 11/10/2018 FINDINGS: Brain: No hydrocephalus, mass, collection, or abnormal diffusion. Cortical volume loss that is mild and nonspecific. No abnormal enhancement. Small vessel ischemic type changes mild mainly in the periventricular white matter. No abnormal mineralization or blood products. Vascular: Normal flow voids but apparent high-grade mid basilar stenosis on  postcontrast imaging Skull and upper cervical spine: Negative Sinuses/Orbits: Negative IMPRESSION: 1. No specific explanation for symptoms. Cortical volume loss is mild for age. 2. 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 Electronically Signed   By: Monte Fantasia M.D.   On: 06/29/2019 10:13    Results for orders placed or performed in visit on 06/28/19 (from the past 72 hour(s))  COMPLETE METABOLIC PANEL WITH GFR     Status: Abnormal   Collection Time: 06/28/19  8:42 AM  Result Value Ref Range   Glucose, Bld 157 (H) 65 - 99 mg/dL    Comment: .            Fasting reference interval . For someone without known diabetes, a glucose value >125 mg/dL indicates that they may have diabetes and this should be confirmed with a follow-up test. .    BUN 10 7 - 25 mg/dL   Creat 0.88 0.60 - 0.93 mg/dL    Comment: For patients >56 years of age, the reference limit for Creatinine is approximately 13% higher for people identified as African-American. .    GFR, Est Non African American 65 > OR = 60 mL/min/1.7m2   GFR, Est African American 75 > OR = 60 mL/min/1.42m2   BUN/Creatinine Ratio NOT APPLICABLE 6 - 22 (calc)   Sodium 138 135 - 146 mmol/L   Potassium 3.6 3.5 - 5.3 mmol/L   Chloride 102 98 - 110 mmol/L   CO2 31 20 - 32 mmol/L   Calcium 9.3 8.6 - 10.4 mg/dL   Total Protein 6.0 (L) 6.1 - 8.1 g/dL   Albumin 3.6 3.6 - 5.1 g/dL   Globulin 2.4 1.9 -  3.7 g/dL (calc)   AG Ratio 1.5 1.0 - 2.5 (calc)   Total Bilirubin 0.9 0.2 - 1.2 mg/dL   Alkaline phosphatase (APISO) 79 37 - 153 U/L   AST 34 10 - 35 U/L   ALT 29 6 - 29 U/L          ASSESSMENT/PLAN: The primary encounter diagnosis was Essential hypertension. Diagnoses of Hallucination, Cognitive deficits, and Acute kidney injury [facility](HCC) were also pertinent to this visit.   AKI resolved  Meds reviewed again    Patient Instructions  MRI looked ok, pllease call Dr Richrd HumblesPenumalli's office to see what their  follow-up plan is.   Please call and let me know what dose of Rosuvastatin you're taking. I have removed the Atorvastatin form your list.  Please start back on the Losartan 50 mg daily and Lasix 40 mg once daily, can take an addition 40 mg again around lunch or dinner if swelling is still a problem.            Follow-up plan: Return in about 4 weeks (around 07/27/2019) for A1C / follow-up sugars and will recheck labs, see me sooner if needed .                                                 ################################################# ################################################# ################################################# #################################################    Current Meds  Medication Sig  . AMBULATORY NON FORMULARY MEDICATION Blood pressure monitor, around upper arm, not wrist.  Per patient preference and insurance coverage.  . AMBULATORY NON FORMULARY MEDICATION Nebulizer device miscellaneous: Please include machine, necessary tubing and masks.  Dispensed per patient preference and insurance coverage.  Diagnosis COPD, pneumonia.  . ARIPiprazole (ABILIFY) 10 MG tablet Take 10 mg by mouth daily.  . benztropine (COGENTIN) 0.5 MG tablet Take 0.5 mg by mouth 2 (two) times daily.  . brompheniramine-pseudoephedrine-DM 30-2-10 MG/5ML syrup Take 2.5-5 mLs by mouth 4 (four) times daily as needed (cough, congestion).  . clonazepam (KLONOPIN) 2 MG disintegrating tablet Take 1 mg by mouth 3 (three) times daily as needed (anxiety).  Marland Kitchen. diphenoxylate-atropine (LOMOTIL) 2.5-0.025 MG tablet One to 2 tablets by mouth 4 times a day as needed for diarrhea.  . DULoxetine (CYMBALTA) 60 MG capsule Take 120 mg by mouth daily.   . fluticasone (FLONASE ALLERGY RELIEF) 50 MCG/ACT nasal spray Place 2 sprays into the nose daily as needed for allergies.  . furosemide (LASIX) 40 MG tablet Take 1 tablet (40 mg total) by mouth daily. As  needed for swelling  . HUMALOG KWIKPEN 100 UNIT/ML KwikPen Inject 7 Units into the skin 3 (three) times daily before meals.  . Insulin Pen Needle (PEN NEEDLES) 31G X 8 MM MISC For use with insulin pen qid as directed, please dispense per patient preference and insurance coverage  . Ipratropium-Albuterol (COMBIVENT RESPIMAT) 20-100 MCG/ACT AERS respimat Inhale 1 puff into the lungs every 6 (six) hours as needed for wheezing.  Marland Kitchen. ipratropium-albuterol (DUONEB) 0.5-2.5 (3) MG/3ML SOLN Take 3 mLs by nebulization every 2 (two) hours as needed (wheeze, SOB).  Marland Kitchen. LANTUS SOLOSTAR 100 UNIT/ML Solostar Pen INJECT 34 UNITS INTO THE SKIN AT BEDTIME.  . Magnesium Oxide 400 (240 Mg) MG TABS Take 1-2 tablets by mouth See admin instructions. 1 tablet in the AM and 2 tablets in the evening  . Multiple Vitamin (MULTIVITAMIN) capsule Take 1 capsule by mouth  daily.  . nitroGLYCERIN (NITROSTAT) 0.4 MG SL tablet Place 1 tablet (0.4 mg total) under the tongue every 5 (five) minutes as needed for chest pain.  Marland Kitchen ondansetron (ZOFRAN-ODT) 8 MG disintegrating tablet Take 1 tablet (8 mg total) by mouth every 8 (eight) hours as needed for nausea.  . potassium chloride SA (K-DUR,KLOR-CON) 20 MEQ tablet Take 20 mEq by mouth daily.  . pregabalin (LYRICA) 50 MG capsule Take 1 capsule (50 mg total) by mouth 3 (three) times daily as needed.  . Vitamin D, Ergocalciferol, (DRISDOL) 1.25 MG (50000 UT) CAPS capsule Take 1 capsule (50,000 Units total) by mouth every 14 (fourteen) days.  . [DISCONTINUED] atorvastatin (LIPITOR) 40 MG tablet Take 1 tablet (40 mg total) by mouth at bedtime.    Allergies  Allergen Reactions  . Morphine Itching and Rash  . Ciprofloxacin Other (See Comments)    AMS  . Latex Hives and Other (See Comments)    Other reaction(s): Other (See Comments)  . Promethazine Other (See Comments)  . Ibuprofen   . Penicillins Hives    Did it involve swelling of the face/tongue/throat, SOB, or low BP? No Did it involve  sudden or severe rash/hives, skin peeling, or any reaction on the inside of your mouth or nose? No Did you need to seek medical attention at a hospital or doctor's office? No When did it last happen? If all above answers are "NO", may proceed with cephalosporin use.        Review of Systems:  Constitutional: No fever/chill  Cardiac: No  chest pain, No  pressure  Respiratory:  No  shortness of breath. No  Cough  Neurologic: No  weakness, No  Dizziness  Psychiatric: No  concerns with depression, +concerns with anxiety  Exam:  BP (!) 164/95 (BP Location: Left Arm, Patient Position: Sitting, Cuff Size: Normal)   Pulse 90   Temp 98.1 F (36.7 C) (Oral)   Wt 218 lb 8 oz (99.1 kg)   BMI 42.67 kg/m   Constitutional: VS see above. General Appearance: alert, well-developed, well-nourished, NAD  Eyes: Normal lids and conjunctive, non-icteric sclera  Neck: No masses, trachea midline.   Respiratory: Normal respiratory effort.  Neurological: Normal balance/coordination. No tremor.  Skin: warm, dry, intact.   Psychiatric: Normal judgment/insight. Normal mood and affect. Oriented x3.       Visit summary with medication list and pertinent instructions was printed for patient to review, patient was advised to alert [place] if any updates are needed. All questions at time of visit were answered - patient instructed to contact office with any additional concerns. ER/RTC precautions were reviewed with the patient and understanding verbalized.   Note: Total time spent 25 minutes, greater than 50% of the visit was spent face-to-face counseling and coordinating care for the following: The primary encounter diagnosis was Essential hypertension. Diagnoses of Hallucination, Cognitive deficits, and Acute kidney injury [facility]) were also pertinent to this visit.Marland Kitchen  Please note: voice recognition software was used to produce this document, and typos may escape review. Please contact Dr. Lyn Hollingshead for  any needed clarifications.    Follow up plan: Return in about 4 weeks (around 07/27/2019) for A1C / follow-up sugars and will recheck labs, see me sooner if needed .

## 2019-06-29 NOTE — Telephone Encounter (Signed)
Husband called back and had phone on speaker with wife there. I advised them her MRI results are unremarkable. I reviewed Dr Annell Greening plan and advised they call for any questions or concerns. They  verbalized understanding, appreciation.

## 2019-07-26 ENCOUNTER — Other Ambulatory Visit: Payer: Self-pay | Admitting: Cardiology

## 2019-07-27 ENCOUNTER — Ambulatory Visit: Payer: Medicare Other | Admitting: Osteopathic Medicine

## 2019-08-02 ENCOUNTER — Other Ambulatory Visit: Payer: Self-pay | Admitting: Osteopathic Medicine

## 2019-08-02 NOTE — Telephone Encounter (Signed)
Requested medication (s) are due for refill today: yes  Requested medication (s) are on the active medication list: yes  Last refill:  05/04/2019  Future visit scheduled: no  Notes to clinic:  Last filled by different provider    Requested Prescriptions  Pending Prescriptions Disp Refills   KLOR-CON M20 20 MEQ tablet [Pharmacy Med Name: KLOR-CON M20 TABLET] 90 tablet 1    Sig: TAKE 1 [place] DAY     Endocrinology:  Minerals - Potassium Supplementation Passed - 08/02/2019  9:24 AM      Passed - K in normal range and within 360 days    Potassium  Date Value Ref Range Status  06/28/2019 3.6 3.5 - 5.3 mmol/L Final         Passed - Cr in normal range and within 360 days    Creat  Date Value Ref Range Status  06/28/2019 0.88 0.60 - 0.93 mg/dL Final    Comment:    For patients >49 years of age, the reference limit for Creatinine is approximately 13% higher for people identified as African-American. Renella Cunas - Valid encounter within last 12 months    Recent Outpatient Visits          1 month ago Essential hypertension   [place], Lanelle Bal, DO   1 month ago [facility], [place], DO   1 month ago AKI (acute kidney injury) [facility])   [facility], Lanelle Bal, DO   2 months ago [place], Lanelle Bal, DO   3 months ago Primary osteoarthritis of left knee   [facility], Gwen Her, MD      Future Appointments            In 3 weeks Marshell Garfinkel, MD [facility]   In 1 month [place], Denice Bors, MD [place]

## 2019-08-03 ENCOUNTER — Ambulatory Visit: Payer: Medicare Other | Admitting: Osteopathic Medicine

## 2019-08-10 ENCOUNTER — Other Ambulatory Visit: Payer: Self-pay | Admitting: Sports Medicine

## 2019-08-10 DIAGNOSIS — M5416 Radiculopathy, lumbar region: Secondary | ICD-10-CM

## 2019-08-21 DIAGNOSIS — A419 Sepsis, unspecified organism: Secondary | ICD-10-CM

## 2019-08-21 HISTORY — DX: Sepsis, unspecified organism: A41.9

## 2019-08-24 ENCOUNTER — Emergency Department (HOSPITAL_COMMUNITY): Payer: Medicare Other

## 2019-08-24 ENCOUNTER — Other Ambulatory Visit: Payer: Self-pay

## 2019-08-24 ENCOUNTER — Ambulatory Visit (INDEPENDENT_AMBULATORY_CARE_PROVIDER_SITE_OTHER): Payer: Medicare Other | Admitting: Pulmonary Disease

## 2019-08-24 ENCOUNTER — Inpatient Hospital Stay (HOSPITAL_COMMUNITY)
Admission: EM | Admit: 2019-08-24 | Discharge: 2019-09-20 | DRG: 871 | Disposition: E | Payer: Medicare Other | Attending: Internal Medicine | Admitting: Internal Medicine

## 2019-08-24 ENCOUNTER — Encounter (HOSPITAL_COMMUNITY): Payer: Self-pay

## 2019-08-24 ENCOUNTER — Telehealth: Payer: Self-pay

## 2019-08-24 ENCOUNTER — Encounter: Payer: Medicare Other | Admitting: Pulmonary Disease

## 2019-08-24 DIAGNOSIS — J449 Chronic obstructive pulmonary disease, unspecified: Secondary | ICD-10-CM

## 2019-08-24 DIAGNOSIS — Z7189 Other specified counseling: Secondary | ICD-10-CM | POA: Diagnosis not present

## 2019-08-24 DIAGNOSIS — N183 Chronic kidney disease, stage 3 unspecified: Secondary | ICD-10-CM | POA: Diagnosis not present

## 2019-08-24 DIAGNOSIS — I130 Hypertensive heart and chronic kidney disease with heart failure and stage 1 through stage 4 chronic kidney disease, or unspecified chronic kidney disease: Secondary | ICD-10-CM | POA: Diagnosis not present

## 2019-08-24 DIAGNOSIS — Z955 Presence of coronary angioplasty implant and graft: Secondary | ICD-10-CM | POA: Diagnosis not present

## 2019-08-24 DIAGNOSIS — R531 Weakness: Secondary | ICD-10-CM | POA: Diagnosis present

## 2019-08-24 DIAGNOSIS — E662 Morbid (severe) obesity with alveolar hypoventilation: Secondary | ICD-10-CM | POA: Diagnosis present

## 2019-08-24 DIAGNOSIS — M7989 Other specified soft tissue disorders: Secondary | ICD-10-CM | POA: Diagnosis not present

## 2019-08-24 DIAGNOSIS — Z880 Allergy status to penicillin: Secondary | ICD-10-CM

## 2019-08-24 DIAGNOSIS — Z86711 Personal history of pulmonary embolism: Secondary | ICD-10-CM

## 2019-08-24 DIAGNOSIS — K219 Gastro-esophageal reflux disease without esophagitis: Secondary | ICD-10-CM | POA: Diagnosis present

## 2019-08-24 DIAGNOSIS — L89159 Pressure ulcer of sacral region, unspecified stage: Secondary | ICD-10-CM

## 2019-08-24 DIAGNOSIS — E7800 Pure hypercholesterolemia, unspecified: Secondary | ICD-10-CM | POA: Diagnosis present

## 2019-08-24 DIAGNOSIS — E1122 Type 2 diabetes mellitus with diabetic chronic kidney disease: Secondary | ICD-10-CM | POA: Diagnosis present

## 2019-08-24 DIAGNOSIS — Z91040 Latex allergy status: Secondary | ICD-10-CM

## 2019-08-24 DIAGNOSIS — M797 Fibromyalgia: Secondary | ICD-10-CM | POA: Diagnosis present

## 2019-08-24 DIAGNOSIS — N179 Acute kidney failure, unspecified: Secondary | ICD-10-CM | POA: Diagnosis present

## 2019-08-24 DIAGNOSIS — K4090 Unilateral inguinal hernia, without obstruction or gangrene, not specified as recurrent: Secondary | ICD-10-CM | POA: Diagnosis not present

## 2019-08-24 DIAGNOSIS — Z7901 Long term (current) use of anticoagulants: Secondary | ICD-10-CM

## 2019-08-24 DIAGNOSIS — A419 Sepsis, unspecified organism: Secondary | ICD-10-CM | POA: Diagnosis present

## 2019-08-24 DIAGNOSIS — Z515 Encounter for palliative care: Secondary | ICD-10-CM | POA: Diagnosis not present

## 2019-08-24 DIAGNOSIS — G9340 Encephalopathy, unspecified: Secondary | ICD-10-CM | POA: Diagnosis not present

## 2019-08-24 DIAGNOSIS — G4733 Obstructive sleep apnea (adult) (pediatric): Secondary | ICD-10-CM | POA: Diagnosis not present

## 2019-08-24 DIAGNOSIS — E785 Hyperlipidemia, unspecified: Secondary | ICD-10-CM | POA: Diagnosis present

## 2019-08-24 DIAGNOSIS — Z794 Long term (current) use of insulin: Secondary | ICD-10-CM

## 2019-08-24 DIAGNOSIS — I252 Old myocardial infarction: Secondary | ICD-10-CM

## 2019-08-24 DIAGNOSIS — J9611 Chronic respiratory failure with hypoxia: Secondary | ICD-10-CM | POA: Diagnosis not present

## 2019-08-24 DIAGNOSIS — Z8249 Family history of ischemic heart disease and other diseases of the circulatory system: Secondary | ICD-10-CM | POA: Diagnosis not present

## 2019-08-24 DIAGNOSIS — I255 Ischemic cardiomyopathy: Secondary | ICD-10-CM | POA: Diagnosis present

## 2019-08-24 DIAGNOSIS — Z20828 Contact with and (suspected) exposure to other viral communicable diseases: Secondary | ICD-10-CM | POA: Diagnosis present

## 2019-08-24 DIAGNOSIS — R29818 Other symptoms and signs involving the nervous system: Secondary | ICD-10-CM | POA: Diagnosis not present

## 2019-08-24 DIAGNOSIS — Z6841 Body Mass Index (BMI) 40.0 and over, adult: Secondary | ICD-10-CM

## 2019-08-24 DIAGNOSIS — L89150 Pressure ulcer of sacral region, unstageable: Secondary | ICD-10-CM | POA: Diagnosis present

## 2019-08-24 DIAGNOSIS — R6521 Severe sepsis with septic shock: Secondary | ICD-10-CM | POA: Diagnosis present

## 2019-08-24 DIAGNOSIS — Z7951 Long term (current) use of inhaled steroids: Secondary | ICD-10-CM

## 2019-08-24 DIAGNOSIS — Z885 Allergy status to narcotic agent status: Secondary | ICD-10-CM

## 2019-08-24 DIAGNOSIS — G9341 Metabolic encephalopathy: Secondary | ICD-10-CM | POA: Diagnosis present

## 2019-08-24 DIAGNOSIS — Z881 Allergy status to other antibiotic agents status: Secondary | ICD-10-CM

## 2019-08-24 DIAGNOSIS — I96 Gangrene, not elsewhere classified: Secondary | ICD-10-CM | POA: Diagnosis present

## 2019-08-24 DIAGNOSIS — Z66 Do not resuscitate: Secondary | ICD-10-CM | POA: Diagnosis present

## 2019-08-24 DIAGNOSIS — L98429 Non-pressure chronic ulcer of back with unspecified severity: Secondary | ICD-10-CM | POA: Diagnosis not present

## 2019-08-24 DIAGNOSIS — I2510 Atherosclerotic heart disease of native coronary artery without angina pectoris: Secondary | ICD-10-CM | POA: Diagnosis present

## 2019-08-24 DIAGNOSIS — Z87891 Personal history of nicotine dependence: Secondary | ICD-10-CM

## 2019-08-24 DIAGNOSIS — I5022 Chronic systolic (congestive) heart failure: Secondary | ICD-10-CM | POA: Diagnosis present

## 2019-08-24 DIAGNOSIS — I5020 Unspecified systolic (congestive) heart failure: Secondary | ICD-10-CM | POA: Diagnosis not present

## 2019-08-24 DIAGNOSIS — Z79899 Other long term (current) drug therapy: Secondary | ICD-10-CM

## 2019-08-24 DIAGNOSIS — J984 Other disorders of lung: Secondary | ICD-10-CM | POA: Diagnosis not present

## 2019-08-24 DIAGNOSIS — Z886 Allergy status to analgesic agent status: Secondary | ICD-10-CM

## 2019-08-24 DIAGNOSIS — E1152 Type 2 diabetes mellitus with diabetic peripheral angiopathy with gangrene: Secondary | ICD-10-CM | POA: Diagnosis not present

## 2019-08-24 DIAGNOSIS — E1165 Type 2 diabetes mellitus with hyperglycemia: Secondary | ICD-10-CM | POA: Diagnosis not present

## 2019-08-24 LAB — CBC WITH DIFFERENTIAL/PLATELET
Abs Immature Granulocytes: 0.04 10*3/uL (ref 0.00–0.07)
Basophils Absolute: 0.1 10*3/uL (ref 0.0–0.1)
Basophils Relative: 0 %
Eosinophils Absolute: 0.1 10*3/uL (ref 0.0–0.5)
Eosinophils Relative: 1 %
HCT: 46.3 % — ABNORMAL HIGH (ref 36.0–46.0)
Hemoglobin: 15 g/dL (ref 12.0–15.0)
Immature Granulocytes: 0 %
Lymphocytes Relative: 13 %
Lymphs Abs: 1.5 10*3/uL (ref 0.7–4.0)
MCH: 31.6 pg (ref 26.0–34.0)
MCHC: 32.4 g/dL (ref 30.0–36.0)
MCV: 97.5 fL (ref 80.0–100.0)
Monocytes Absolute: 1.2 10*3/uL — ABNORMAL HIGH (ref 0.1–1.0)
Monocytes Relative: 11 %
Neutro Abs: 8.7 10*3/uL — ABNORMAL HIGH (ref 1.7–7.7)
Neutrophils Relative %: 75 %
Platelets: 234 10*3/uL (ref 150–400)
RBC: 4.75 MIL/uL (ref 3.87–5.11)
RDW: 14.6 % (ref 11.5–15.5)
WBC: 11.6 10*3/uL — ABNORMAL HIGH (ref 4.0–10.5)
nRBC: 0 % (ref 0.0–0.2)

## 2019-08-24 LAB — URINALYSIS, ROUTINE W REFLEX MICROSCOPIC
Bilirubin Urine: NEGATIVE
Glucose, UA: NEGATIVE mg/dL
Ketones, ur: NEGATIVE mg/dL
Nitrite: NEGATIVE
Protein, ur: 100 mg/dL — AB
Specific Gravity, Urine: 1.01 (ref 1.005–1.030)
pH: 7 (ref 5.0–8.0)

## 2019-08-24 LAB — LACTIC ACID, PLASMA
Lactic Acid, Venous: 2.4 mmol/L (ref 0.5–1.9)
Lactic Acid, Venous: 2.1 mmol/L (ref 0.5–1.9)

## 2019-08-24 LAB — AMMONIA: Ammonia: 14 umol/L (ref 9–35)

## 2019-08-24 LAB — POCT I-STAT EG7
Acid-Base Excess: 3 mmol/L — ABNORMAL HIGH (ref 0.0–2.0)
Bicarbonate: 28.5 mmol/L — ABNORMAL HIGH (ref 20.0–28.0)
Calcium, Ion: 1.1 mmol/L — ABNORMAL LOW (ref 1.15–1.40)
HCT: 50 % — ABNORMAL HIGH (ref 36.0–46.0)
Hemoglobin: 17 g/dL — ABNORMAL HIGH (ref 12.0–15.0)
O2 Saturation: 99 %
Potassium: 3.9 mmol/L (ref 3.5–5.1)
Sodium: 139 mmol/L (ref 135–145)
TCO2: 30 mmol/L (ref 22–32)
pCO2, Ven: 43.9 mmHg — ABNORMAL LOW (ref 44.0–60.0)
pH, Ven: 7.42 (ref 7.250–7.430)
pO2, Ven: 164 mmHg — ABNORMAL HIGH (ref 32.0–45.0)

## 2019-08-24 LAB — COMPREHENSIVE METABOLIC PANEL
ALT: 24 U/L (ref 0–44)
AST: 40 U/L (ref 15–41)
Albumin: 2.9 g/dL — ABNORMAL LOW (ref 3.5–5.0)
Alkaline Phosphatase: 106 U/L (ref 38–126)
Anion gap: 17 — ABNORMAL HIGH (ref 5–15)
BUN: 25 mg/dL — ABNORMAL HIGH (ref 8–23)
CO2: 22 mmol/L (ref 22–32)
Calcium: 9.7 mg/dL (ref 8.9–10.3)
Chloride: 100 mmol/L (ref 98–111)
Creatinine, Ser: 2.01 mg/dL — ABNORMAL HIGH (ref 0.44–1.00)
GFR calc Af Amer: 28 mL/min — ABNORMAL LOW (ref >60)
GFR calc non Af Amer: 24 mL/min — ABNORMAL LOW (ref >60)
Glucose, Bld: 124 mg/dL — ABNORMAL HIGH (ref 70–99)
Potassium: 4.1 mmol/L (ref 3.5–5.1)
Sodium: 139 mmol/L (ref 135–145)
Total Bilirubin: 1.4 mg/dL — ABNORMAL HIGH (ref 0.3–1.2)
Total Protein: 7.1 g/dL (ref 6.5–8.1)

## 2019-08-24 LAB — PROTIME-INR
INR: 1.7 — ABNORMAL HIGH (ref 0.8–1.2)
Prothrombin Time: 19.7 seconds — ABNORMAL HIGH (ref 11.4–15.2)

## 2019-08-24 LAB — APTT: aPTT: 35 seconds (ref 24–36)

## 2019-08-24 LAB — GRAM STAIN

## 2019-08-24 LAB — CBG MONITORING, ED: Glucose-Capillary: 102 mg/dL — ABNORMAL HIGH (ref 70–99)

## 2019-08-24 LAB — SARS CORONAVIRUS 2 (TAT 6-24 HRS): SARS Coronavirus 2: NEGATIVE

## 2019-08-24 MED ORDER — DULOXETINE HCL 60 MG PO CPEP: 120.0000 mg | ORAL_CAPSULE | Freq: Every day | ORAL | Status: DC

## 2019-08-24 MED ORDER — BENZTROPINE MESYLATE 1 MG PO TABS: 0.5000 mg | ORAL_TABLET | Freq: Two times a day (BID) | ORAL | Status: DC

## 2019-08-24 MED ORDER — ROSUVASTATIN CALCIUM 5 MG PO TABS: 10.0000 mg | ORAL_TABLET | Freq: Every day | ORAL | Status: DC

## 2019-08-24 MED ORDER — SODIUM CHLORIDE 0.9 % IV SOLN
2.0000 g | Freq: Once | INTRAVENOUS | Status: AC
  Administered 2019-08-24: 2 g via INTRAVENOUS
  Filled 2019-08-24: qty 2

## 2019-08-24 MED ORDER — ACETAMINOPHEN 650 MG RE SUPP: 650.0000 mg | Freq: Four times a day (QID) | RECTAL | Status: DC | PRN

## 2019-08-24 MED ORDER — LACTATED RINGERS IV SOLN
INTRAVENOUS | Status: DC
  Administered 2019-08-24: 23:00:00 via INTRAVENOUS

## 2019-08-24 MED ORDER — CLINDAMYCIN PHOSPHATE 600 MG/50ML IV SOLN
600.0000 mg | Freq: Three times a day (TID) | INTRAVENOUS | Status: DC
  Administered 2019-08-24 – 2019-08-25 (×3): 600 mg via INTRAVENOUS
  Filled 2019-08-24 (×3): qty 50

## 2019-08-24 MED ORDER — PREGABALIN 25 MG PO CAPS: 50.0000 mg | ORAL_CAPSULE | Freq: Three times a day (TID) | ORAL | Status: DC | PRN

## 2019-08-24 MED ORDER — CLONAZEPAM 1 MG PO TBDP: 1.0000 mg | ORAL_TABLET | Freq: Three times a day (TID) | ORAL | Status: DC | PRN

## 2019-08-24 MED ORDER — LACTATED RINGERS IV BOLUS
500.0000 mL | Freq: Once | INTRAVENOUS | Status: AC
  Administered 2019-08-24: 500 mL via INTRAVENOUS

## 2019-08-24 MED ORDER — HEPARIN SODIUM (PORCINE) 5000 UNIT/ML IJ SOLN: 5000.0000 [IU] | Freq: Three times a day (TID) | INTRAMUSCULAR | Status: DC

## 2019-08-24 MED ORDER — PREGABALIN 25 MG PO CAPS: 50.0000 mg | ORAL_CAPSULE | Freq: Two times a day (BID) | ORAL | Status: DC | PRN

## 2019-08-24 MED ORDER — VANCOMYCIN HCL 10 G IV SOLR
2000.0000 mg | Freq: Once | INTRAVENOUS | Status: AC
  Administered 2019-08-24: 19:00:00 2000 mg via INTRAVENOUS
  Filled 2019-08-24: qty 2000

## 2019-08-24 MED ORDER — ROPINIROLE HCL 1 MG PO TABS: 6.0000 mg | ORAL_TABLET | Freq: Every day | ORAL | Status: DC

## 2019-08-24 MED ORDER — DULOXETINE HCL 60 MG PO CPEP: 60.0000 mg | ORAL_CAPSULE | Freq: Every day | ORAL | Status: DC

## 2019-08-24 MED ORDER — VANCOMYCIN VARIABLE DOSE PER UNSTABLE RENAL FUNCTION (PHARMACIST DOSING): Status: DC

## 2019-08-24 MED ORDER — HEPARIN (PORCINE) 25000 UT/250ML-% IV SOLN
1200.0000 [IU]/h | INTRAVENOUS | Status: DC
  Administered 2019-08-24: 1200 [IU]/h via INTRAVENOUS
  Filled 2019-08-24: qty 250

## 2019-08-24 MED ORDER — INSULIN ASPART 100 UNIT/ML ~~LOC~~ SOLN: 0.0000 [IU] | SUBCUTANEOUS | Status: DC

## 2019-08-24 MED ORDER — ARIPIPRAZOLE 10 MG PO TABS: 10.0000 mg | ORAL_TABLET | Freq: Every day | ORAL | Status: DC

## 2019-08-24 MED ORDER — ROSUVASTATIN CALCIUM 20 MG PO TABS: 40.0000 mg | ORAL_TABLET | Freq: Every day | ORAL | Status: DC

## 2019-08-24 MED ORDER — ACETAMINOPHEN 325 MG PO TABS: 650.0000 mg | ORAL_TABLET | Freq: Four times a day (QID) | ORAL | Status: DC | PRN

## 2019-08-24 NOTE — Telephone Encounter (Signed)
Pt's husband is requesting a sooner appt for pt. As per husband, pt has been getting bed sores on her body for 2 weeks. He thinks that they are getting infected. He's been keeping the infected areas dried and clean as he can. Wants her to come in before scheduled appt on 09/09/19. Aware that provider has limited appts, ok if scheduled with another provider if necessary. Thanks.

## 2019-08-24 NOTE — Telephone Encounter (Addendum)
Contacted Mr. Sarnowski regarding denial of setting up an earlier appt for pt with another provider in office. As per Mr. Marcus, only wants to see Dr. Sheppard Coil for care. At initial phone call, he was informed that Dr. Redgie Grayer schedule had little or no availability of appointments. He believes the bed sore wounds are getting infected and does not know what to do. I repeatedly informed Mr. Doby multiple times throughout the call the importance to have pt be evaluated by another provider. I then informed him of a virtual appt for 9/5 at 910 am and it was originally declined when asked initially asked if appt time would work for them. Mr. Eiben became very irate and rudely responded "I can't move her, she can barely stand and move around. She's very weak. I don't know how Dr. Sheppard Coil is going to do a virtual visit. This doesn't make any sense".  I have informed Mr. Makris if pt condition is as he currently states to contact 911 and have pt transported to ER via ambulance. He declined stating that he is upset that pt is unable to be seen. He then requested to continue with the plan of the virtual visit tomorrow with provider. Mr. Hirsch was informed that if pt's symptoms should worsen to have pt transported to the nearest ER/UC. Mr. Koehler replied, "No I will wait until the appt with Dr. Sheppard Coil and decide tomorrow whether I need to take Rayona to UC or not". Call then ended abruptly on pt's end. Pt has been scheduled for virtual appt on 9/5 at 910 am with provider.

## 2019-08-24 NOTE — Progress Notes (Signed)
Pharmacy Antibiotic Note  Valerie Keller is a 73 y.o. female admitted on 08/23/2019 with sepsis.  Pharmacy has been consulted for Vancomycin dosing.  Weight: 212 lb 1.3 oz (96.2 kg)  No data recorded.  Recent Labs  Lab 09/14/2019 1547 09/19/2019 1615  WBC 11.6*  --   CREATININE  --  2.01*    Estimated Creatinine Clearance: 25.5 mL/min (A) (by C-G formula based on SCr of 2.01 mg/dL (H)).    Allergies  Allergen Reactions  . Morphine Itching and Rash  . Ciprofloxacin Other (See Comments)    AMS  . Latex Hives and Other (See Comments)    Other reaction(s): Other (See Comments)  . Promethazine Other (See Comments)  . Ibuprofen   . Penicillins Hives    Did it involve swelling of the face/tongue/throat, SOB, or low BP? No Did it involve sudden or severe rash/hives, skin peeling, or any reaction on the inside of your mouth or nose? No Did you need to seek medical attention at a hospital or doctor's office? No When did it last happen? If all above answers are "NO", may proceed with cephalosporin use.     Antimicrobials this admission: 11/4 Cefepime >>  11/4 Vancomycin >>   Microbiology results:  Plan: - Vancomycin 2000 mg IV x 1 dose  - Patient appears to have AKI with increased Scr from pervious results  - Will dose vancomycin by variable dosing at this time until Scr recovers (likely ~q48h dosing)  - Continue to monitor patients renal fxn   Thank you for allowing pharmacy to be a part of this patient's care.  Duanne Limerick PharmD. BCPS  09/14/2019 5:19 PM

## 2019-08-24 NOTE — H&P (Addendum)
Date: August 25, 2019               Patient Name:  Valerie Keller MRN: [id number]  DOB: 12-18-1945 Age / Sex: 73 y.o., female   PCP: Emeterio Reeve, DO         Medical Service: Internal Medicine Teaching Service         Attending Physician: Dr. Noemi Chapel, MD    First Contact: Dr. Court Joy Pager: [phone]  Second Contact: Dr. Koleen Distance  Pager: [phone]       After Hours (After 5p/  First Contact Pager: [phone]  weekends / holidays): Second Contact Pager: [phone]   Chief Complaint: Altered mental status  History of Present Illness: Unable to obtain any history due to patient's altered mental status.  HPI is pieced together through chart review and husband via telephone call.  Valerie Keller is a 73 yo female with a past medical history significant for obesity, COPD, MI with coronary artery stent placement 2019, HFrEF (45-50%, echo 10/2018) HTN, HLD, T2DM, CKD stage III, fibromyalgia, anxiety, chronic lower back pain with radiculopathy, OSA, GERD, and inguinal hernia who presented to the ED today for altered mental status.  The patient's decline in health started few weeks ago while traveling to [place] to visit her daughter. The patient fell at a rest stop bathroom on the highway.  She did not lose consciousness or hit her head.  When she got to her daughter's house, she spent all of her time in a sofa chair and was not getting up. Over the next couple weeks, the patient's energy and mobility severely worsened to the point she couldn't stand or walk anywhere. Her husband states he had to bring bed pan to her so she could use the bathroom. The ulcer on her bottom started developing after her fall on the road trip to [place]. She did not have any ulcers prior to this.  He was helping her clean the wound and change the dressings, however this became more difficult as her functional status got worsened.  He also mentioned over this time it is been more difficult to understand what she is saying  and is frequently mumbling.  The husband says over the last couple of days the patient would stand up and she was unbalanced (on heels when trying to stand). The patient fell backwards on to her chair and onto the floor. Once she was on the floor and couldn't get up.  At this point he decided to send her to the hospital.  He says her appetite has decreased over the last few days, noting that she has not been eatin or drinking like her usual self. No new. Has not had a BM in 3 to 4 days.   In the ED, a CBC, BMP, lactate, ammonia, UA, UC were drawn.  Patient had a mild leukocytosis to 11.7 with left shift. Lactate was slightly elevated to 2.4, then 2. UA had moderate Hgb, small leukocytes and rare bacteria.  BUN and creatinine were elevated at 25 and 2.01 (BL 0.88). CT of the head did not show any acute intracranial abnormalities, but did have atherosclerotic calcifications in the internal carotid and vertebral arteries.  CT cervical spine significant for degenerative disc and facet disease.CT abdomen was notable for few foci of gas within the gluteal cleft, concerning for cellulitis and necrotizing fasciitis.  The patient was started on vancomycin, cefepime, and clindamycin.  Meds:  Rosuvastatin 40 mg Lantus 34 U nightly Humalog 7U TID before  meals Lyrica 50 mg Cymbalta 120 mg daily Losartan 50 mg daily Lasix 40 mg daily Klonopin 2 mg daily Abilify 10 mg daily (has not been taking) Benztropine 0.5 mg daily Ipratropium-albuterol  Allergies: Allergies as of 09/05/2019 - Review Complete 08/23/2019  Allergen Reaction Noted   Morphine Itching and Rash 06/25/2018   Ciprofloxacin Other (See Comments) 02/01/2018   Latex Hives and Other (See Comments) 05/26/2014   Promethazine Other (See Comments) 02/10/2017   Ibuprofen  11/10/2018   Penicillins Hives 02/01/2018   Past Medical History:  Diagnosis Date   Diabetes mellitus without complication (HCC)    Fibromyalgia    High blood pressure     High cholesterol    Lung disease    Myocardial infarction (HCC)    Nephrolithiasis    Pulmonary embolus (HCC)    Renal disorder    Surgical History:  Past Surgical History:  Procedure Laterality Date   ABDOMINAL HYSTERECTOMY     CHOLECYSTECTOMY     CORONARY STENT PLACEMENT     x3   FOOT SURGERY     KIDNEY SURGERY     TONSILLECTOMY     Family History:  Family History  Problem Relation Age of Onset   CAD Brother    Diabetes Neg Hx    Cancer Neg Hx    Social History:  - Lives at home with husband.  - Used to be able to walk on her own short distances. Uses motor scooter for long distances - Smoked about a pack of  for 25 years. Quit in the mid 1980s - No alcohol - No recreational drugs   Review of Systems: ROS was completed by the husband, and are otherwise negative unless mentioned in the HPI.  Imaging: CT abdomen/pelvis: IMPRESSION: 1. Skin thickening and subcutaneous fat stranding involving the gluteal cleft which extends to the mildly thickened levator plate. Few foci of gas within the gluteal cleft are likely external. No definite soft tissue gas or organized collection. Findings are concerning for a cellulitis but an aggressive soft tissue infection/necrotizing fasciitis cannot be excluded on an imaging basis. Recommend close evaluation and assessment of laboratory markers (such as a LRINEC score) to assess for the clinical features of necrotizing fasciitis. 2. Mild hazy stranding adjacent the cecum and proximal ascending colon with few reactive appearing lymph nodes. Findings are nonspecific, but could reflect mild colitis. 3. Some peribronchovascular opacity in the left lung base could reflect aspiration. 4. Compression deformities at T8 and 10, T12 and L1 are similar to comparison radiography from June of 2020 and MRI from June 20, 2019 and MRI from June 20, 2019 from June 20, 2019 and MRI from June 20, 2019. 5. Asymmetric right renal atrophy.  Bilateral nonobstructing nephrolithiasis. 6. Aortic Atherosclerosis  CT Head & Cervical Spine: IMPRESSION: 1. Atrophy with mild small vessel chronic ischemic changes of deep cerebral white matter. 2. No acute intracranial abnormalities. 3. Degenerative disc and facet disease changes of the cervical spine. 4.No acute cervical spine abnormalities. 5. Atherosclerotic calcifications involving the internal carotid and vertebral arteries as above.  CXR: IMPRESSION: 1. Markedly diminished lung volumes with streaky areas of basilar atelectasis. 2.  Aortic Atherosclerosis  Pelvis XR: Negative for fx  EKG: No acute ischemic process. One PVC  Physical Exam: Blood pressure 99/77, pulse 80, resp. rate 18, height 5' (1.524 m), weight 96.2 kg, SpO2 99 %.  Physical Exam Vitals signs reviewed.  Constitutional:      Appearance: She is obese. She is ill-appearing and  toxic-appearing.  HENT:     Head: Normocephalic and atraumatic.     Mouth/Throat:     Mouth: Mucous membranes are dry.  Eyes:     General: No scleral icterus.       Right eye: Discharge present.        Left eye: Discharge present.    Conjunctiva/sclera: Conjunctivae normal.     Pupils: Pupils are equal, round, and reactive to light.  Cardiovascular:     Rate and Rhythm: Normal rate and regular rhythm.     Comments: Difficult to appreciate heart sounds due to large body habitus  Bilateral lower extremities cool to the touch.  Left big toe purple in color Pulmonary:     Effort: Pulmonary effort is normal.     Breath sounds: Normal breath sounds.  Abdominal:     Tenderness: There is no abdominal tenderness. There is no guarding or rebound.     Hernia: A hernia (Large, non reducible R sided inguinal hernia) is present.     Comments: Large panus   Genitourinary:    Comments: Unstageable sacral ulcer with black eschar and purulent drainage Musculoskeletal:     Right lower leg: No edema.     Left lower leg: No edema.    Skin:    General: Skin is dry.     Comments: Patches of nonerythematous scaling and peeling skin present on the neck, chest and arms.  Neurological:     Mental Status: She is disoriented.     Deep Tendon Reflexes: Reflexes abnormal (Hyperreflexic in lower extremities.  No clonus noted).     Comments: And frequently follows commands.  Opens eyes to the sound of her name but immediately falls back asleep          Assessment & Plan by Problem: Active Problems:   Sacral ulcer (HCC)  In summary Valerie Keller is a 73 y.o female with a history significant for obesity, COPD, MI with coronary artery stent placement 2019, HFrEF (EF 45-50%) HTN, HLD, T2DM, CKD stage III, fibromyalgia, anxiety, chronic lower back pain with radiculopathy, OSA, GERD, and inguinal hernia who presents with decreased functional status in the context of a purulent sacral ulcer and altered mental status, concerning for sepsis. CT findings are conerning for potential necrotizing fasciitis.   #Acute Encephalopathy due to Sepsis #Septic Shock #Possible necrotizing fasciitis #Purulent sacral ulcer:  - Continue IV vancomycin and cefepime  - Blood cultures ordered - Sacral wound cultures ordered - Placed Foley - Check CK and phosphorous in the AM - Consult general surgery in the AM - Consider MRI of the lumbar spine to evaluate for osteomyelitis  #Ischemic toe  -Vascular ultrasound ABI ordered  #T2DM: Patient takes Lantus 34 U nightly and NovoLog 7U TID with meals at home. A1c was 6.9 in June 2020. - SSI  #FEN/GI - Diet: NPO - Fluids: LR 100 cc/hr  #DVT Prophylaxis - Heparin  #Code status: DNR  #Dispo: Admit patient to Inpatient with expected length of stay greater than 2 midnights.  Signed: Kirt BoysAndrew Kathaleya Mcduffee, MD Internal Medicine, PGY1 Pager: [phone]  09/03/2019,12:05 AM

## 2019-08-24 NOTE — Telephone Encounter (Signed)
Noted. I agree that they are not being reasonable in wanting to see me and only me for potentially urgent medical issue. I have had this discussion with them in the past re: seeing other providers if needed. Sounds like caretaker has been appropriately advised by my office staff, I agree with their recommendations and will discuss further at upcoming visit.

## 2019-08-24 NOTE — ED Provider Notes (Signed)
I saw and evaluated the patient, reviewed the resident's note and I agree with the findings and plan.  Pertinent History:  EMS reports that she was previously healthy until a week ago - had a fall at that time - since then having sickness - (poorly described), fatigue - she is altered and somnolent and not answering questions.  Today she fell again - (unwitnessed), LOC declined on arrival - requiring O2, slumped over, somnolent.  Vitals:   09/09/2019 1700  Weight: 96.2 kg     I was personally present and directly supervised the following procedures:  Medica and Trauma eval for possible ICH / injury - sepsis etc. VS are seeminly better here after initial hypotension - corrected with fluids.   EKG Interpretation  Date/Time:  Wednesday August 24 2019 19:47:22 EST Ventricular Rate:  86 PR Interval:    QRS Duration: 85 QT Interval:  384 QTC Calculation: 460 R Axis:   -56 Text Interpretation: Sinus rhythm Ventricular premature complex Anterolateral infarct, age indeterminate No significant change since last tracing Confirmed by Deno Etienne [phone]) on 08/25/2019 8:34:38 AM        I personally interpreted the EKG as well as the resident and agree with the interpretation on the resident's chart.  Final diagnoses:  Generalized weakness  Pressure injury of skin of sacral region, unspecified injury stage      Noemi Chapel, MD 08/25/19 1209

## 2019-08-24 NOTE — Telephone Encounter (Signed)
Mr. Faith just called and stated that pt was taken to [facility]. Mr. Benning was notified to give [place] a call back once pt is discharge from the hospital for a f/u with provider. Mr. Bosak agreed and verbalized understanding. No other inquiries during call.

## 2019-08-24 NOTE — Progress Notes (Addendum)
ANTICOAGULATION CONSULT NOTE - Initial Consult  Pharmacy Consult for Heparin  Indication: pulmonary embolus hx  Allergies  Allergen Reactions  . Morphine Itching and Rash  . Ciprofloxacin Other (See Comments)    AMS  . Latex Hives and Other (See Comments)    Other reaction(s): Other (See Comments)  . Promethazine Other (See Comments)  . Ibuprofen   . Penicillins Hives    Did it involve swelling of the face/tongue/throat, SOB, or low BP? No Did it involve sudden or severe rash/hives, skin peeling, or any reaction on the inside of your mouth or nose? No Did you need to seek medical attention at a hospital or doctor's office? No When did it last happen? If all above answers are "NO", may proceed with cephalosporin use.     Patient Measurements: Height: 5' (152.4 cm) Weight: 212 lb 1.3 oz (96.2 kg) IBW/kg (Calculated) : 45.5 Heparin Dosing Weight: 68.7 kg .Marland Kitchen  Vital Signs: BP: 122/40 (11/04 2251) Pulse Rate: 80 (11/04 2251)  Labs: Recent Labs    08/28/2019 1547 08/25/2019 1615 08/27/2019 1624  HGB 15.0  --  17.0*  HCT 46.3*  --  50.0*  PLT 234  --   --   APTT 35  --   --   LABPROT 19.7*  --   --   INR 1.7*  --   --   CREATININE  --  2.01*  --     Estimated Creatinine Clearance: 25.5 mL/min (A) (by C-G formula based on SCr of 2.01 mg/dL (H)).   Medical History: Past Medical History:  Diagnosis Date  . Diabetes mellitus without complication ([place])   . Fibromyalgia   . High blood pressure   . High cholesterol   . Lung disease   . Myocardial infarction ([place])   . Nephrolithiasis   . Pulmonary embolus ([place])   . Renal disorder     Medications:  Scheduled:  . [START ON 08/25/2019] insulin aspart  0-15 Units Subcutaneous Q4H  . vancomycin variable dose per unstable renal function (pharmacist dosing)   Does not apply See admin instructions    Assessment: Patient on xarelto for hx of PE PTA. Concern for patient needing a procedure this admission and wished to start  on a heparin drip at this time.  Goal of Therapy:  APTT 50-85  Heparin level 0.3-0.7 units/ml Monitor platelets by anticoagulation protocol: Yes   Plan:  - Baseline Heparin Level  - Will start Heparin @ 1200 units/hr  - Will monitor with aPTT at this time until heparin levels and aPTT correlate  - aPTT in 8 hours after starting heparin drip  - Monitor patient for s/s of bleeding and CBC   Duanne Limerick PharmD. BCPS  09/17/2019,10:57 PM

## 2019-08-24 NOTE — Telephone Encounter (Signed)
Spoke to husband and let him know that I did not have anything sooner than the appointment they had already. I did offer with another provider but husband declined. They will wait until scheduled appointment. No further questions at this time.

## 2019-08-24 NOTE — ED Triage Notes (Signed)
Pt bib gcems after weakness x 1 week. Approx 1 week ago pt was on vacation and fell. Since then pt has gradually become weaker with increased confusion and decreased LOC.

## 2019-08-24 NOTE — Telephone Encounter (Signed)
Noted, hope she is feeling better soon!

## 2019-08-24 NOTE — ED Provider Notes (Signed)
[facility] EMERGENCY DEPARTMENT Provider Note   CSN: [id number] Arrival date & time: 09/15/2019  1528     History   Chief Complaint Chief Complaint  Patient presents with   Weakness    HPI Valerie Keller is a 73 y.o. female.      Weakness Severity:  Severe Onset quality:  Gradual Duration:  1 week Timing:  Constant Progression:  Worsening Chronicity:  New Context comment:  Patient had a fall approximately 1 week ago and also today (it was more that she sat down hard then fell and she was assisted by her husband.)  Her husband has also been taking care of a sacral area pressure ulcer for several weeks but it got much worse  Relieved by:  None tried Worsened by:  Nothing Ineffective treatments:  None tried Associated symptoms: lethargy   Associated symptoms: no abdominal pain, no arthralgias, no chest pain, no cough, no dysuria, no fever, no seizures, no shortness of breath and no vomiting   Risk factors: congestive heart failure and coronary artery disease   Worsening of patient's sacral ulcer took place over the past 1 week.  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   Hypercalcemia 04/07/2019   Primary osteoarthritis of left knee 04/01/2019   Lumbar spondylosis 04/01/2019   Acute on chronic respiratory failure with hypoxia ([place]) 11/15/2018   COPD with acute exacerbation ([place]) 11/15/2018   Hypercapnia 11/15/2018   Community acquired pneumonia of left lung    Class 3 severe obesity due to excess calories in adult [facility]) 10/07/2018    Hypokalemia 06/25/2018   Inguinal hernia 06/25/2018   Coronary artery disease with history of myocardial infarction without history of CABG 05/14/2018   S/P coronary artery stent placement 05/14/2018   Female pelvic pain 05/11/2018   Type 2 diabetes mellitus with hyperglycemia ([place]) 04/12/2018   Atrophic vaginitis 04/15/2017   Angina pectoris ([place]) 02/10/2017   Hyponatremia 02/10/2017   Vitamin D deficiency 09/01/2016   Fibromyalgia 08/01/2016   Anxiety 01/04/2016   Candidal skin infection 01/04/2016   Chronic obstructive pulmonary disease, unspecified ([place]) 01/04/2016   Hyperlipidemia, unspecified 01/04/2016   Fatigue 06/12/2015   Morbid obesity with alveolar hypoventilation ([place]) 11/03/2014   Cardiomyopathy, ischemic 05/16/2014   Obstructive sleep apnea (adult) (pediatric) 02/18/2013   Left ventricular diastolic dysfunction 08/16/2535   Hypomagnesemia 09/09/2012   Chronic kidney disease, stage 3 (moderate) 08/20/2012   Depression with anxiety 08/20/2012   Essential hypertension 08/20/2012   Gastro-esophageal reflux disease without esophagitis 08/20/2012   Calculus of kidney 08/20/2012    Past Surgical History:  Procedure Laterality Date   ABDOMINAL HYSTERECTOMY     CHOLECYSTECTOMY     CORONARY STENT PLACEMENT     x3   FOOT SURGERY     KIDNEY SURGERY     TONSILLECTOMY       OB History   No obstetric history on file.      Home Medications    Prior to Admission medications   Medication Sig Start Date End Date  Taking? Authorizing Provider  albuterol (PROVENTIL) (2.5 MG/3ML) 0.083% nebulizer solution Take 3 mLs by nebulization every 4 (four) hours as needed for shortness of breath. 11/02/18 06/08/19  Sunnie Nielsen, DO  AMBULATORY NON FORMULARY MEDICATION Blood pressure monitor, around upper arm, not wrist.  Per patient preference and insurance coverage. 11/26/18   Sunnie Nielsen, DO  AMBULATORY NON FORMULARY MEDICATION Nebulizer  device miscellaneous: Please include machine, necessary tubing and masks.  Dispensed per patient preference and insurance coverage.  Diagnosis COPD, pneumonia. 11/26/18   Sunnie Nielsen, DO  ARIPiprazole (ABILIFY) 10 MG tablet Take 10 mg by mouth daily.    [provider]  benztropine (COGENTIN) 0.5 MG tablet Take 0.5 mg by mouth 2 (two) times daily. 12/30/18   [provider]  brompheniramine-pseudoephedrine-DM 30-2-10 MG/5ML syrup Take 2.5-5 mLs by mouth 4 (four) times daily as needed (cough, congestion). 06/14/19   Sunnie Nielsen, DO  clonazepam (KLONOPIN) 2 MG disintegrating tablet Take 1 mg by mouth 3 (three) times daily as needed (anxiety). 10/19/18   [provider]  diphenoxylate-atropine (LOMOTIL) 2.5-0.025 MG tablet One to 2 tablets by mouth 4 times a day as needed for diarrhea. 01/27/19   Sunnie Nielsen, DO  DULoxetine (CYMBALTA) 60 MG capsule Take 120 mg by mouth daily.  12/01/17   [provider]  fluticasone Aleda Grana ALLERGY RELIEF) 50 MCG/ACT nasal spray Place 2 sprays into the nose daily as needed for allergies.    [provider]  furosemide (LASIX) 40 MG tablet Take 1 tablet (40 mg total) by mouth daily. As needed for swelling 06/14/19   Sunnie Nielsen, DO  HUMALOG KWIKPEN 100 UNIT/ML KwikPen Inject 7 Units into the skin 3 (three) times daily before meals. 09/30/18   [provider]  Insulin Pen Needle (PEN NEEDLES) 31G X 8 MM MISC For use with insulin pen qid as directed, please dispense per patient preference and insurance coverage 10/27/18   Sunnie Nielsen, DO  Ipratropium-Albuterol (COMBIVENT RESPIMAT) 20-100 MCG/ACT AERS respimat Inhale 1 puff into the lungs every 6 (six) hours as needed for wheezing. 11/02/18   Sunnie Nielsen, DO  ipratropium-albuterol (DUONEB) 0.5-2.5 (3) MG/3ML SOLN Take 3 mLs by nebulization every 2 (two) hours as needed (wheeze, SOB). 11/26/18   Sunnie Nielsen, DO  KLOR-CON M20 20 MEQ tablet  TAKE 1 TABLET BY MOUTH EVERY DAY 08/02/19   Sunnie Nielsen, DO  LANTUS SOLOSTAR 100 UNIT/ML Solostar Pen INJECT 34 UNITS INTO THE SKIN AT BEDTIME. 06/14/19   Sunnie Nielsen, DO  losartan (COZAAR) 50 MG tablet Take 1 tablet (50 mg total) by mouth daily. 06/14/19 09/12/19  Sunnie Nielsen, DO  Magnesium Oxide 400 (240 Mg) MG TABS Take 1-2 tablets by mouth See admin instructions. 1 tablet in the AM and 2 tablets in the evening 11/09/17   [provider]  Multiple Vitamin (MULTIVITAMIN) capsule Take 1 capsule by mouth daily.    [provider]  nitroGLYCERIN (NITROSTAT) 0.4 MG SL tablet Place 1 tablet (0.4 mg total) under the tongue every 5 (five) minutes as needed for chest pain. 12/06/18 12/07/19  Lewayne Bunting, MD  ondansetron (ZOFRAN-ODT) 8 MG disintegrating tablet Take 1 tablet (8 mg total) by mouth every 8 (eight) hours as needed for nausea. 11/26/18   Sunnie Nielsen, DO  pregabalin (LYRICA) 50 MG capsule TAKE 1 CAPSULE (50 MG TOTAL) BY MOUTH 3 (THREE) TIMES DAILY AS NEEDED. 08/10/19   Monica Becton, MD  rOPINIRole (REQUIP) 2 MG tablet Take 6 mg by mouth at bedtime.  08/15/17 06/08/19  [provider]  rosuvastatin (CRESTOR) 40 MG tablet Take 40 mg by mouth daily.    [provider]  Vitamin D, Ergocalciferol, (DRISDOL) 1.25 MG (50000 UT) CAPS capsule Take 1 capsule (50,000 Units total) by mouth every 14 (fourteen) days. Patient not taking: Reported on 08/30/2019 11/02/18   Sunnie Nielsen, DO  XARELTO 20 MG TABS tablet TAKE 1 TABLET (20 MG TOTAL) BY MOUTH DAILY WITH SUPPER FOR 30 DAYS. 07/26/19   Lewayne Bunting, MD    Family History Family History  Problem Relation Age of Onset   CAD Brother    Diabetes Neg Hx    Cancer Neg Hx     Social History Social History   Tobacco Use   Smoking status: Former Smoker    Packs/day: 0.25    Years: 32.00    Pack years: 8.00    Types: Cigarettes    Quit date: 10/21/1983    Years since  quitting: 35.8   Smokeless tobacco: Never Used  Substance Use Topics   Alcohol use: Never    Frequency: Never   Drug use: Never     Allergies   Morphine, Ciprofloxacin, Latex, Promethazine, Ibuprofen, and Penicillins   Review of Systems Review of Systems  Constitutional: Positive for fatigue. Negative for chills and fever.  HENT: Negative for ear pain and sore throat.   Eyes: Negative for pain and visual disturbance.  Respiratory: Negative for cough and shortness of breath.   Cardiovascular: Negative for chest pain and palpitations.  Gastrointestinal: Negative for abdominal pain and vomiting.  Genitourinary: Negative for dysuria and hematuria.  Musculoskeletal: Negative for arthralgias and back pain.  Skin: Negative for color change and rash.  Neurological: Positive for weakness (Generalized). Negative for seizures and syncope.  All other systems reviewed and are negative.    Physical Exam Updated Vital Signs BP (!) 107/46    Pulse 84    Resp (!) 21    Ht 5' (1.524 m)    Wt 96.2 kg    SpO2 100%    BMI 41.42 kg/m   Physical Exam Vitals signs and nursing note reviewed.  Constitutional:      General: She is in acute distress.     Appearance: She is well-developed. She is obese. She is ill-appearing.     Comments: Patient is somnolent currently.  She is alert and oriented to person, place, year however it is difficult for her to keep her eyes open  HENT:     Head: Normocephalic and atraumatic.  Eyes:     Conjunctiva/sclera: Conjunctivae normal.  Neck:     Musculoskeletal: Normal range of motion and neck supple. No neck rigidity.  Cardiovascular:     Rate and Rhythm: Normal rate and regular rhythm.     Pulses: Normal pulses.     Heart sounds: No murmur.  Pulmonary:     Effort: Pulmonary effort is normal. No respiratory distress.     Breath sounds: Normal breath sounds.  Chest:     Chest wall: No tenderness.  Abdominal:     General: Abdomen is flat.      Palpations: Abdomen is soft.     Tenderness: There is no abdominal tenderness. There is no right CVA tenderness or left CVA tenderness.  Skin:    General: Skin is warm and dry.     Capillary Refill: Capillary refill takes less than 2 seconds.     Comments: Approximately 15 cm area of pressure ulcer in her sacral  area with surrounding cellulitis.  No crepitus or fluctuance or purulence.  Neurological:     General: No focal deficit present.     Mental Status: She is alert and oriented to person, place, and time.     Cranial Nerves: No cranial nerve deficit.     Sensory: No sensory deficit.     Motor: Weakness (Significant generalized weakness) present.     Deep Tendon Reflexes: Reflexes normal.      ED Treatments / Results  Labs (all labs ordered are listed, but only abnormal results are displayed) Labs Reviewed  LACTIC ACID, PLASMA - Abnormal; Notable for the following components:      Result Value   Lactic Acid, Venous 2.4 (*)    All other components within normal limits  LACTIC ACID, PLASMA - Abnormal; Notable for the following components:   Lactic Acid, Venous 2.1 (*)    All other components within normal limits  CBC WITH DIFFERENTIAL/PLATELET - Abnormal; Notable for the following components:   WBC 11.6 (*)    HCT 46.3 (*)    Neutro Abs 8.7 (*)    Monocytes Absolute 1.2 (*)    All other components within normal limits  PROTIME-INR - Abnormal; Notable for the following components:   Prothrombin Time 19.7 (*)    INR 1.7 (*)    All other components within normal limits  URINALYSIS, ROUTINE W REFLEX MICROSCOPIC - Abnormal; Notable for the following components:   Hgb urine dipstick MODERATE (*)    Protein, ur 100 (*)    Leukocytes,Ua SMALL (*)    Bacteria, UA RARE (*)    All other components within normal limits  COMPREHENSIVE METABOLIC PANEL - Abnormal; Notable for the following components:   Glucose, Bld 124 (*)    BUN 25 (*)    Creatinine, Ser 2.01 (*)    Albumin 2.9  (*)    Total Bilirubin 1.4 (*)    GFR calc non Af Amer 24 (*)    GFR calc Af Amer 28 (*)    Anion gap 17 (*)    All other components within normal limits  POCT I-STAT EG7 - Abnormal; Notable for the following components:   pCO2, Ven 43.9 (*)    pO2, Ven 164.0 (*)    Bicarbonate 28.5 (*)    Acid-Base Excess 3.0 (*)    Calcium, Ion 1.10 (*)    HCT 50.0 (*)    Hemoglobin 17.0 (*)    All other components within normal limits  CBG MONITORING, ED - Abnormal; Notable for the following components:   Glucose-Capillary 102 (*)    All other components within normal limits  GRAM STAIN  SARS CORONAVIRUS 2 (TAT 6-24 HRS)  URINE CULTURE  CULTURE, BLOOD (ROUTINE X 2)  CULTURE, BLOOD (ROUTINE X 2)  APTT  AMMONIA  C-REACTIVE PROTEIN  CBC  CREATININE, SERUM  COMPREHENSIVE METABOLIC PANEL  CBC  HEPARIN LEVEL (UNFRACTIONATED)  HEPARIN LEVEL (UNFRACTIONATED)  APTT  CK  PHOSPHORUS  I-STAT VENOUS BLOOD GAS, ED    EKG None  Radiology Ct Abdomen Pelvis Wo Contrast  Result Date: 09/10/2019 CLINICAL DATA:  Necrotizing fasciitis, concern for Fournier's gangrene EXAM: CT ABDOMEN AND PELVIS WITHOUT CONTRAST TECHNIQUE: Multidetector CT imaging of the abdomen and pelvis was performed following the standard protocol without IV contrast. COMPARISON:  Lumbar MRI June 20, 2019, lumbar spine radiograph April 01, 2019 FINDINGS: Lower chest: There are basilar atelectatic changes in the lungs. Some peribronchovascular opacity in the left lung base could reflect  aspiration. Mild cardiomegaly with extensive coronary artery disease. No pericardial effusion. Hepatobiliary: No focal liver abnormality is seen. Patient is post cholecystectomy. Slight prominence of the biliary tree likely related to reservoir effect. No calcified intraductal gallstones. Pancreas: Fatty replacement of the pancreas. No pancreatic ductal dilatation or surrounding inflammatory changes. Spleen: Normal in size without focal abnormality.  Adrenals/Urinary Tract: Normal adrenal glands. Slightly asymmetric right renal atrophy. Mild bilateral symmetric perinephric stranding, a nonspecific finding though may correlate with either age or decreased renal function. Fluid attenuation cyst present in the interpolar left kidney measuring approximately 2.3 cm in size. Multiple splenic vascular calcifications. Few nonobstructing calculi present in the lower pole both kidneys. No obstructive urolithiasis or hydronephrosis. Moderate bladder distention without wall thickening. Stomach/Bowel: Distal esophagus, stomach and duodenal sweep are unremarkable. No small bowel wall thickening or dilatation. No evidence of obstruction. The appendix is not visualized. Surgical material at the tip of the cecum may reflect prior appendectomy. Mild hazy stranding is seen adjacent the cecum and proximal ascending colon with few reactive appearing lymph nodes. No extraluminal gas or organized collection. The distal colon has a more normal appearance. Vascular/Lymphatic: Atherosclerotic plaque is present throughout the aorta and branch vessels. No aneurysm or ectasia. Few reactive appearing lymph nodes are noted in the right lower quadrant Reproductive: Uterus is surgically absent. No concerning adnexal lesions. Other: No abdominopelvic free fluid or free gas. No bowel containing hernias. Multiple injection granulomata in the gluteal soft tissues. There is skin thickening and subcutaneous fat stranding involving the gluteal cleft which extends to the mildly thickened levator plate. No organized collection is seen. Few foci of gas within the gluteal cleft are likely external to the soft tissues. No definite soft tissue gas or organized collection. Musculoskeletal: Multilevel degenerative changes are present in the imaged portions of the spine. Compression deformities at T8 and 10, T12 and L1 are similar to comparison radiography from June of 2020 and MRI from June 20, 2019.  IMPRESSION: 1. Skin thickening and subcutaneous fat stranding involving the gluteal cleft which extends to the mildly thickened levator plate. Few foci of gas within the gluteal cleft are likely external. No definite soft tissue gas or organized collection. Findings are concerning for a cellulitis but an aggressive soft tissue infection/necrotizing fasciitis cannot be excluded on an imaging basis. Recommend close evaluation and assessment of laboratory markers (such as a LRINEC score) to assess for the clinical features of necrotizing fasciitis. 2. Mild hazy stranding adjacent the cecum and proximal ascending colon with few reactive appearing lymph nodes. Findings are nonspecific, but could reflect mild colitis. 3. Some peribronchovascular opacity in the left lung base could reflect aspiration. 4. Compression deformities at T8 and 10, T12 and L1 are similar to comparison radiography from June of 2020 and MRI from June 20, 2019 and MRI from June 20, 2019 from June 20, 2019 and MRI from June 20, 2019. 5. Asymmetric right renal atrophy. Bilateral nonobstructing nephrolithiasis. 6. Aortic Atherosclerosis (ICD10-I70.0). Electronically Signed   By: Kreg Shropshire M.D.   On: 09/11/2019 21:11   Dg Pelvis 1-2 Views  Result Date: 09/05/2019 CLINICAL DATA:  Fall.  Weakness. EXAM: PELVIS - 1-2 VIEW COMPARISON:  None. FINDINGS: There is no evidence of pelvic fracture or diastasis. No pelvic bone lesions are seen. IMPRESSION: Negative. Electronically Signed   By: Signa Kell M.D.   On: 08/23/2019 17:58   Ct Head Wo Contrast  Result Date: 08/21/2019 CLINICAL DATA:  Larey Seat 1 week ago on vacation, weakness, increased  confusion, decreased level of consciousness, diabetes mellitus, hypertension, coronary artery disease post MI EXAM: CT HEAD WITHOUT CONTRAST CT CERVICAL SPINE WITHOUT CONTRAST TECHNIQUE: Multidetector CT imaging of the head and cervical spine was performed following the standard protocol without  intravenous contrast. Multiplanar CT image reconstructions of the cervical spine were also generated. COMPARISON:  CT head 11/10/2018, CT cervical spine 02/02/2018 FINDINGS: CT HEAD FINDINGS Brain: Generalized atrophy. Normal ventricular morphology. No midline shift or mass effect. Small vessel chronic ischemic changes of deep cerebral white matter. No intracranial hemorrhage, mass lesion, evidence of acute infarction, or extra-axial fluid collection. Vascular: Atherosclerotic calcification of internal carotid arteries bilaterally at skull base Skull: Demineralized but intact Sinuses/Orbits: Clear Other: N/A CT CERVICAL SPINE FINDINGS Alignment: Normal Skull base and vertebrae: Demineralized. Multilevel facet degenerative changes. Mild scattered disc space narrowing and minimal endplate spur formation. Vertebral body heights maintained without fracture or subluxation. Vertebral endplate irregularity superiorly at C7 and T1 appear unchanged. Visualized skull base intact. Head rotated. Soft tissues and spinal canal: Prevertebral soft tissues normal thickness. Atherosclerotic calcification of internal carotid and vertebral arteries at skull base. Carotid arteries extend retropharyngeal bilaterally. Disc levels:  Unremarkable Upper chest: Lung apices clear Other: N/A IMPRESSION: Atrophy with mild small vessel chronic ischemic changes of deep cerebral white matter. No acute intracranial abnormalities. Degenerative disc and facet disease changes of the cervical spine. No acute cervical spine abnormalities. Atherosclerotic calcifications involving the internal carotid and vertebral arteries as above. Electronically Signed   By: Ulyses Southward M.D.   On: 09/09/2019 17:43   Ct Cervical Spine Wo Contrast  Result Date: 09/06/2019 CLINICAL DATA:  Larey Seat 1 week ago on vacation, weakness, increased confusion, decreased level of consciousness, diabetes mellitus, hypertension, coronary artery disease post MI EXAM: CT HEAD WITHOUT  CONTRAST CT CERVICAL SPINE WITHOUT CONTRAST TECHNIQUE: Multidetector CT imaging of the head and cervical spine was performed following the standard protocol without intravenous contrast. Multiplanar CT image reconstructions of the cervical spine were also generated. COMPARISON:  CT head 11/10/2018, CT cervical spine 02/02/2018 FINDINGS: CT HEAD FINDINGS Brain: Generalized atrophy. Normal ventricular morphology. No midline shift or mass effect. Small vessel chronic ischemic changes of deep cerebral white matter. No intracranial hemorrhage, mass lesion, evidence of acute infarction, or extra-axial fluid collection. Vascular: Atherosclerotic calcification of internal carotid arteries bilaterally at skull base Skull: Demineralized but intact Sinuses/Orbits: Clear Other: N/A CT CERVICAL SPINE FINDINGS Alignment: Normal Skull base and vertebrae: Demineralized. Multilevel facet degenerative changes. Mild scattered disc space narrowing and minimal endplate spur formation. Vertebral body heights maintained without fracture or subluxation. Vertebral endplate irregularity superiorly at C7 and T1 appear unchanged. Visualized skull base intact. Head rotated. Soft tissues and spinal canal: Prevertebral soft tissues normal thickness. Atherosclerotic calcification of internal carotid and vertebral arteries at skull base. Carotid arteries extend retropharyngeal bilaterally. Disc levels:  Unremarkable Upper chest: Lung apices clear Other: N/A IMPRESSION: Atrophy with mild small vessel chronic ischemic changes of deep cerebral white matter. No acute intracranial abnormalities. Degenerative disc and facet disease changes of the cervical spine. No acute cervical spine abnormalities. Atherosclerotic calcifications involving the internal carotid and vertebral arteries as above. Electronically Signed   By: Ulyses Southward M.D.   On: 09/14/2019 17:43   Dg Chest Port 1 View  Result Date: 09/14/2019 CLINICAL DATA:  New O2 requirement EXAM:  PORTABLE CHEST 1 VIEW COMPARISON:  Radiograph 01/27/2019 FINDINGS: Lung volumes are significantly diminished with streaky areas of basilar atelectasis. No consolidative opacity. No pneumothorax. No visible effusion. Mild  cardiomegaly though the cardiac silhouette may be accentuated by portable technique. The aorta is calcified. The remaining cardiomediastinal contours are unremarkable. Degenerative changes are present in the imaged spine and shoulders. IMPRESSION: 1. Markedly diminished lung volumes with streaky areas of basilar atelectasis. 2.  Aortic Atherosclerosis (ICD10-I70.0). Electronically Signed   By: Kreg Shropshire M.D.   On: 08/31/2019 17:06    Procedures Procedures (including critical care time)  Medications Ordered in ED Medications  vancomycin variable dose per unstable renal function (pharmacist dosing) (has no administration in time range)  acetaminophen (TYLENOL) tablet 650 mg (has no administration in time range)    Or  acetaminophen (TYLENOL) suppository 650 mg (has no administration in time range)  clindamycin (CLEOCIN) IVPB 600 mg (600 mg Intravenous New Bag/Given 08-31-19 2309)  insulin aspart (novoLOG) injection 0-15 Units (0 Units Subcutaneous Not Given 08-31-2019 2328)  lactated ringers infusion ( Intravenous New Bag/Given 08/31/19 2304)  heparin ADULT infusion 100 units/mL (25000 units/250mL sodium chloride 0.45%) (1,200 Units/hr Intravenous New Bag/Given Aug 31, 2019 2350)  ceFEPIme (MAXIPIME) 2 g in sodium chloride 0.9 % 100 mL IVPB (0 g Intravenous Stopped 2019-08-31 1911)  vancomycin (VANCOCIN) 2,000 mg in sodium chloride 0.9 % 500 mL IVPB (0 mg Intravenous Stopped 2019-08-31 2113)  lactated ringers bolus 500 mL (0 mLs Intravenous Stopped 31-Aug-2019 2230)  lactated ringers bolus 500 mL (500 mLs Intravenous New Bag/Given Aug 31, 2019 2234)     Initial Impression / Assessment and Plan / ED Course  I have reviewed the triage vital signs and the nursing notes.  Pertinent labs & imaging  results that were available during my care of the patient were reviewed by me and considered in my medical decision making (see chart for details).        Patient is 73 year old female with history and physical exam as above presents emergency department for evaluation of somnolence, shortness of breath, fatigue and generalized weakness.  She was found by EMS to be hypoxemic, hypotensive, and with a GCS of 14 (mild confusion: Somewhat somnolent but oriented to person, place, year but not month).  At the time of my initial evaluation patient has heart rate of 90, oxygen level of 4 L or 96%, blood pressure of 100 systolic status post 500 cc IV fluid bolus administered by EMS.  Due to concern for likely infectious source of patient's symptoms chest x-ray, EKG, CBC, metabolic panel, lactic acid, blood gas were obtained.  Preliminary findings notable for elevated lactic acid, blood gas was overall reassuring, glucose within normal limits, creatinine elevated at 2.01 which is significantly increased from baseline.  Repeat lactic acid downtrending from 2.4-2.1.  Patient has systolic heart failure and due to body habitus I was unable to fully evaluate her IVC with ultrasound machine.  No crackles were noted on pulmonary auscultation however patient was not given full 30 cc/kg IV fluid bolus due to her congestive heart failure.  She was given approximately 1 L prior to arrival and an additional 1.5 L in several increments for borderline low blood pressures.  Blood pressure at the time of admission 111/45.  Skin exam demonstrates a large sacral pressure ulcer with dark discoloration of the overlying tissue.  No crepitus palpated.  There is some surrounding cellulitis but no fluctuance or purulence.  CT scan of the abdomen pelvis were obtained to evaluate for Fournier's gangrene.  This demonstrated no findings definitively concerning for necrotizing fasciitis.  Due to patient's recent falls imaging of the head and  cervical spine was also obtained  which demonstrated no traumatic findings.  Patient was initiated on broad-spectrum antibiotics and blood cultures were ordered.  This was done early in the course of the patient's treatment.  Hold off on antibiotics initially as patient's blood pressure was stable.  Patient's mental status began declining shortly into her time in the emergency department.  She became more confused, significantly more somnolent however she did improve with treatment over several hours.  Her husband came to the bedside and provided additional history.  Reportedly she had been somnolent for several weeks to a similar degree as she is now.  Patient will be admitted to internal medicine service for further work-up and management of her sepsis likely mediated by her pressure ulcer in her sacral area.  Patient was seen in conjunction with my attending physician Dr. Hyacinth Meeker.  Final Clinical Impressions(s) / ED Diagnoses   Final diagnoses:  Generalized weakness  Pressure injury of skin of sacral region, unspecified injury stage    ED Discharge Orders    None       Jonna Clark, MD Sep 04, 2019 2357    Eber Hong, MD 08/25/19 1210    Eber Hong, MD 09/05/19 [phone]

## 2019-08-24 NOTE — Progress Notes (Signed)
 This encounter was created in error - please disregard.

## 2019-08-25 ENCOUNTER — Encounter (HOSPITAL_COMMUNITY): Payer: Medicare Other

## 2019-08-25 ENCOUNTER — Ambulatory Visit: Payer: Medicare Other | Admitting: Osteopathic Medicine

## 2019-08-25 DIAGNOSIS — J984 Other disorders of lung: Secondary | ICD-10-CM

## 2019-08-25 DIAGNOSIS — G9340 Encephalopathy, unspecified: Secondary | ICD-10-CM

## 2019-08-25 DIAGNOSIS — I252 Old myocardial infarction: Secondary | ICD-10-CM

## 2019-08-25 DIAGNOSIS — G4733 Obstructive sleep apnea (adult) (pediatric): Secondary | ICD-10-CM

## 2019-08-25 DIAGNOSIS — Z880 Allergy status to penicillin: Secondary | ICD-10-CM

## 2019-08-25 DIAGNOSIS — A419 Sepsis, unspecified organism: Principal | ICD-10-CM

## 2019-08-25 DIAGNOSIS — K4090 Unilateral inguinal hernia, without obstruction or gangrene, not specified as recurrent: Secondary | ICD-10-CM

## 2019-08-25 DIAGNOSIS — E785 Hyperlipidemia, unspecified: Secondary | ICD-10-CM

## 2019-08-25 DIAGNOSIS — Z91040 Latex allergy status: Secondary | ICD-10-CM

## 2019-08-25 DIAGNOSIS — M5416 Radiculopathy, lumbar region: Secondary | ICD-10-CM

## 2019-08-25 DIAGNOSIS — Z888 Allergy status to other drugs, medicaments and biological substances status: Secondary | ICD-10-CM

## 2019-08-25 DIAGNOSIS — Z955 Presence of coronary angioplasty implant and graft: Secondary | ICD-10-CM

## 2019-08-25 DIAGNOSIS — E1122 Type 2 diabetes mellitus with diabetic chronic kidney disease: Secondary | ICD-10-CM

## 2019-08-25 DIAGNOSIS — I96 Gangrene, not elsewhere classified: Secondary | ICD-10-CM

## 2019-08-25 DIAGNOSIS — I5020 Unspecified systolic (congestive) heart failure: Secondary | ICD-10-CM

## 2019-08-25 DIAGNOSIS — N179 Acute kidney failure, unspecified: Secondary | ICD-10-CM

## 2019-08-25 DIAGNOSIS — Z87891 Personal history of nicotine dependence: Secondary | ICD-10-CM

## 2019-08-25 DIAGNOSIS — Z885 Allergy status to narcotic agent status: Secondary | ICD-10-CM

## 2019-08-25 DIAGNOSIS — J9611 Chronic respiratory failure with hypoxia: Secondary | ICD-10-CM | POA: Insufficient documentation

## 2019-08-25 DIAGNOSIS — M7989 Other specified soft tissue disorders: Secondary | ICD-10-CM | POA: Diagnosis present

## 2019-08-25 DIAGNOSIS — I2510 Atherosclerotic heart disease of native coronary artery without angina pectoris: Secondary | ICD-10-CM

## 2019-08-25 DIAGNOSIS — I130 Hypertensive heart and chronic kidney disease with heart failure and stage 1 through stage 4 chronic kidney disease, or unspecified chronic kidney disease: Secondary | ICD-10-CM

## 2019-08-25 DIAGNOSIS — K219 Gastro-esophageal reflux disease without esophagitis: Secondary | ICD-10-CM

## 2019-08-25 DIAGNOSIS — N183 Chronic kidney disease, stage 3 unspecified: Secondary | ICD-10-CM

## 2019-08-25 DIAGNOSIS — Z66 Do not resuscitate: Secondary | ICD-10-CM

## 2019-08-25 DIAGNOSIS — G8929 Other chronic pain: Secondary | ICD-10-CM

## 2019-08-25 DIAGNOSIS — J449 Chronic obstructive pulmonary disease, unspecified: Secondary | ICD-10-CM

## 2019-08-25 DIAGNOSIS — M797 Fibromyalgia: Secondary | ICD-10-CM

## 2019-08-25 DIAGNOSIS — Z960 Presence of urogenital implants: Secondary | ICD-10-CM

## 2019-08-25 DIAGNOSIS — Z881 Allergy status to other antibiotic agents status: Secondary | ICD-10-CM

## 2019-08-25 DIAGNOSIS — E669 Obesity, unspecified: Secondary | ICD-10-CM

## 2019-08-25 DIAGNOSIS — L89150 Pressure ulcer of sacral region, unstageable: Secondary | ICD-10-CM

## 2019-08-25 DIAGNOSIS — M545 Low back pain: Secondary | ICD-10-CM

## 2019-08-25 DIAGNOSIS — Z794 Long term (current) use of insulin: Secondary | ICD-10-CM

## 2019-08-25 DIAGNOSIS — E1152 Type 2 diabetes mellitus with diabetic peripheral angiopathy with gangrene: Secondary | ICD-10-CM

## 2019-08-25 DIAGNOSIS — Z79899 Other long term (current) drug therapy: Secondary | ICD-10-CM

## 2019-08-25 DIAGNOSIS — L98429 Non-pressure chronic ulcer of back with unspecified severity: Secondary | ICD-10-CM

## 2019-08-25 DIAGNOSIS — R29818 Other symptoms and signs involving the nervous system: Secondary | ICD-10-CM | POA: Insufficient documentation

## 2019-08-25 DIAGNOSIS — F419 Anxiety disorder, unspecified: Secondary | ICD-10-CM

## 2019-08-25 LAB — CBG MONITORING, ED
Glucose-Capillary: 99 mg/dL (ref 70–99)
Glucose-Capillary: 91 mg/dL (ref 70–99)
Glucose-Capillary: 85 mg/dL (ref 70–99)
Glucose-Capillary: 99 mg/dL (ref 70–99)

## 2019-08-25 LAB — CBC
HCT: 45.8 % (ref 36.0–46.0)
Hemoglobin: 14.5 g/dL (ref 12.0–15.0)
MCH: 31.5 pg (ref 26.0–34.0)
MCHC: 31.7 g/dL (ref 30.0–36.0)
MCV: 99.3 fL (ref 80.0–100.0)
Platelets: 168 10*3/uL (ref 150–400)
RBC: 4.61 MIL/uL (ref 3.87–5.11)
RDW: 14.6 % (ref 11.5–15.5)
WBC: 8.2 10*3/uL (ref 4.0–10.5)
nRBC: 0 % (ref 0.0–0.2)

## 2019-08-25 LAB — COMPREHENSIVE METABOLIC PANEL
ALT: 22 U/L (ref 0–44)
AST: 30 U/L (ref 15–41)
Albumin: 2.2 g/dL — ABNORMAL LOW (ref 3.5–5.0)
Alkaline Phosphatase: 86 U/L (ref 38–126)
Anion gap: 12 (ref 5–15)
BUN: 20 mg/dL (ref 8–23)
CO2: 23 mmol/L (ref 22–32)
Calcium: 9 mg/dL (ref 8.9–10.3)
Chloride: 108 mmol/L (ref 98–111)
Creatinine, Ser: 1.62 mg/dL — ABNORMAL HIGH (ref 0.44–1.00)
GFR calc Af Amer: 36 mL/min — ABNORMAL LOW (ref >60)
GFR calc non Af Amer: 31 mL/min — ABNORMAL LOW (ref >60)
Glucose, Bld: 96 mg/dL (ref 70–99)
Potassium: 3.5 mmol/L (ref 3.5–5.1)
Sodium: 143 mmol/L (ref 135–145)
Total Bilirubin: 0.9 mg/dL (ref 0.3–1.2)
Total Protein: 5.5 g/dL — ABNORMAL LOW (ref 6.5–8.1)

## 2019-08-25 LAB — PHOSPHORUS: Phosphorus: 3.3 mg/dL (ref 2.5–4.6)

## 2019-08-25 LAB — LACTIC ACID, PLASMA: Lactic Acid, Venous: 3.6 mmol/L (ref 0.5–1.9)

## 2019-08-25 LAB — HEPARIN LEVEL (UNFRACTIONATED): Heparin Unfractionated: 1.2 IU/mL — ABNORMAL HIGH (ref 0.30–0.70)

## 2019-08-25 LAB — URINE CULTURE

## 2019-08-25 LAB — C-REACTIVE PROTEIN: CRP: 7.8 mg/dL — ABNORMAL HIGH (ref <1.0)

## 2019-08-25 LAB — CORTISOL: Cortisol, Plasma: 14.2 ug/dL

## 2019-08-25 LAB — CK: Total CK: 690 U/L — ABNORMAL HIGH (ref 38–234)

## 2019-08-25 MED ORDER — POLYVINYL ALCOHOL 1.4 % OP SOLN
1.0000 [drp] | Freq: Four times a day (QID) | OPHTHALMIC | Status: DC | PRN
  Filled 2019-08-25: qty 15

## 2019-08-25 MED ORDER — ONDANSETRON 4 MG PO TBDP: 4.0000 mg | ORAL_TABLET | Freq: Four times a day (QID) | ORAL | Status: DC | PRN

## 2019-08-25 MED ORDER — HYDROMORPHONE HCL 1 MG/ML IJ SOLN: 1.0000 mg | Freq: Once | INTRAMUSCULAR | Status: DC

## 2019-08-25 MED ORDER — LACTATED RINGERS IV BOLUS
500.0000 mL | Freq: Once | INTRAVENOUS | Status: AC
  Administered 2019-08-25: 500 mL via INTRAVENOUS

## 2019-08-25 MED ORDER — LORAZEPAM 2 MG/ML PO CONC: 1.0000 mg | ORAL | Status: DC | PRN

## 2019-08-25 MED ORDER — INSULIN ASPART 100 UNIT/ML ~~LOC~~ SOLN: 0.0000 [IU] | Freq: Every day | SUBCUTANEOUS | Status: DC

## 2019-08-25 MED ORDER — HALOPERIDOL LACTATE 5 MG/ML IJ SOLN: 0.5000 mg | INTRAMUSCULAR | Status: DC | PRN

## 2019-08-25 MED ORDER — GLYCOPYRROLATE 0.2 MG/ML IJ SOLN
0.2000 mg | INTRAMUSCULAR | Status: DC | PRN
  Filled 2019-08-25: qty 1

## 2019-08-25 MED ORDER — BIOTENE DRY MOUTH MT LIQD: 15.0000 mL | OROMUCOSAL | Status: DC | PRN

## 2019-08-25 MED ORDER — LORAZEPAM 1 MG PO TABS: 1.0000 mg | ORAL_TABLET | ORAL | Status: DC | PRN

## 2019-08-25 MED ORDER — GLYCOPYRROLATE 1 MG PO TABS
1.0000 mg | ORAL_TABLET | ORAL | Status: DC | PRN
  Filled 2019-08-25: qty 1

## 2019-08-25 MED ORDER — SODIUM CHLORIDE 0.9 % IV SOLN
2.0000 g | INTRAVENOUS | Status: DC
  Administered 2019-08-25: 2 g via INTRAVENOUS
  Filled 2019-08-25: qty 2

## 2019-08-25 MED ORDER — MORPHINE SULFATE (PF) 2 MG/ML IV SOLN
1.0000 mg | INTRAVENOUS | Status: DC | PRN
  Administered 2019-08-25: 1 mg via INTRAVENOUS
  Filled 2019-08-25: qty 1

## 2019-08-25 MED ORDER — LACTATED RINGERS IV SOLN
INTRAVENOUS | Status: DC
  Administered 2019-08-25: 11:00:00 via INTRAVENOUS

## 2019-08-25 MED ORDER — LORAZEPAM 2 MG/ML IJ SOLN
1.0000 mg | INTRAMUSCULAR | Status: DC | PRN
  Administered 2019-08-27: 1 mg via INTRAVENOUS
  Filled 2019-08-25: qty 1

## 2019-08-25 MED ORDER — GLYCOPYRROLATE 0.2 MG/ML IJ SOLN
0.2000 mg | INTRAMUSCULAR | Status: DC | PRN
  Administered 2019-08-26 – 2019-08-28 (×2): 0.2 mg via INTRAVENOUS
  Filled 2019-08-25 (×5): qty 1

## 2019-08-25 MED ORDER — ONDANSETRON HCL 4 MG/2ML IJ SOLN: 4.0000 mg | Freq: Four times a day (QID) | INTRAMUSCULAR | Status: DC | PRN

## 2019-08-25 MED ORDER — INSULIN ASPART 100 UNIT/ML ~~LOC~~ SOLN: 0.0000 [IU] | Freq: Three times a day (TID) | SUBCUTANEOUS | Status: DC

## 2019-08-25 MED ORDER — PHENYLEPHRINE HCL-NACL 10-0.9 MG/250ML-% IV SOLN
0.0000 ug/min | INTRAVENOUS | Status: DC
  Administered 2019-08-25: 20 ug/min via INTRAVENOUS
  Filled 2019-08-25 (×2): qty 250

## 2019-08-25 MED ORDER — HALOPERIDOL 0.5 MG PO TABS
0.5000 mg | ORAL_TABLET | ORAL | Status: DC | PRN
  Filled 2019-08-25: qty 1

## 2019-08-25 MED ORDER — HYDROMORPHONE BOLUS VIA INFUSION
0.5000 mg | INTRAVENOUS | Status: DC | PRN
  Administered 2019-08-26 – 2019-08-27 (×4): 0.5 mg via INTRAVENOUS
  Filled 2019-08-25: qty 1

## 2019-08-25 MED ORDER — ACETAMINOPHEN 650 MG RE SUPP: 650.0000 mg | Freq: Four times a day (QID) | RECTAL | Status: DC | PRN

## 2019-08-25 MED ORDER — HALOPERIDOL LACTATE 2 MG/ML PO CONC
0.5000 mg | ORAL | Status: DC | PRN
  Filled 2019-08-25: qty 0.3

## 2019-08-25 MED ORDER — SODIUM CHLORIDE 0.9 % IV SOLN
0.5000 mg/h | INTRAVENOUS | Status: DC
  Administered 2019-08-25: 21:00:00 0.5 mg/h via INTRAVENOUS
  Administered 2019-08-28: 1 mg/h via INTRAVENOUS
  Filled 2019-08-25 (×2): qty 5

## 2019-08-25 MED ORDER — ACETAMINOPHEN 325 MG PO TABS: 650.0000 mg | ORAL_TABLET | Freq: Four times a day (QID) | ORAL | Status: DC | PRN

## 2019-08-25 NOTE — Consult Note (Signed)
[facility] Nurse wound consult note Patient receiving care in [place].  Consult completed remotely after review of record, including images. Reason for Consult: "sacral wound" Wound type: unstageable, necrotic.  Per H & P by Dr. Pietro Cassis on 09/19/2019 at 2141, "CT findings are conerning for potential necrotizing fasciitis." Treatment of potential necrotizing fasciitis exceeds the scope of practice of the [place] nurse.  Please consult surgery for potential surgical intervention if there is a possibility for necrotizing fasciitis. Drainage (amount, consistency, odor) purulent drainage per MD note Periwound: erythematous Dressing procedure/placement/frequency:  Twice daily saline moistened gauze and ABD pads.  Thank you for the consult.  Discussed plan of care with the patient and bedside nurse.  [place] nurse will not follow at this time.  Please re-consult the [place] team if needed.  Val Riles, RN, MSN, CWOCN, CNS-BC, pager [phone]

## 2019-08-25 NOTE — Progress Notes (Signed)
Consult received, chart reviewed.   Called patient's spouse- he notes that his daughter is coming from out of town tonight.   Offered to meet with spouse and daughter tomorrow morning- Mr. Grindle noted he was concerned patient would not make it to tomorrow. Gave emotional support.   Plan made to check in and meet with family tomorrow morning if patient is still admitted.   Mariana Kaufman, AGNP-C Palliative Medicine  Please call Palliative Medicine team phone with any questions [phone]. For individual providers please see AMION.  No charge

## 2019-08-25 NOTE — ED Notes (Signed)
Admitting team met with pt's family. Plan is now comfort care. They will place new order for med/surg bed. Verbal order to stop Neo drip.  

## 2019-08-25 NOTE — ED Notes (Signed)
CCM at bedside verbal order to hold phenylephrine for now

## 2019-08-25 NOTE — ED Notes (Signed)
Admitting physician paged in regards to comfort measures. New orders to be placed.

## 2019-08-25 NOTE — ED Notes (Signed)
Pt's dtr and husband arrived to room. Paged admitting MD to notify that family is present.

## 2019-08-25 NOTE — Progress Notes (Signed)
Update after speaking to patients daughter and husband: Patient has multiple medical issues and quality of life has decreased over the years. After family had discussion they do not believe the mother would want to undergo a surgery. Family ask for [place] to continue current care , including pressor support only until they arrive. They should arrive tonight or tomorrow. After they arrive they will have further discussion on goals of care. Appreciate palliative consult.   [name], MD PGY1  [phone]

## 2019-08-25 NOTE — ED Notes (Signed)
Heparin gtt stopped per CCM

## 2019-08-25 NOTE — ED Notes (Signed)
Pt is A-fib on monitor 

## 2019-08-25 NOTE — ED Notes (Signed)
Cleansed pt's mouth/ provided oral care

## 2019-08-25 NOTE — Progress Notes (Signed)
Talked to patient husband to confirm goals of care. Husband expresses his understanding as the following: He is aware is wife has multiple comorbiditys and currently has an infection of her sacrum. He understands this infection would likely need surgical intervention. Currently his wife is on antibiotics. She is requiring supplemental oxygen and blood pressure support with fluids. He discussed pressors with critical care and says he would not like to give his wife pressors unless a surgical intervention was offered. Patient is aware we will reach out to our surgical team to evaluate , but his wife may not be offered a surgery due to her overall condition. He is understanding and will share this information with his daughter. They were given a number to reach our team and have further discussions after processing this information together.   [name], MD PGY1  [phone]

## 2019-08-25 NOTE — Progress Notes (Addendum)
   Subjective: Pt is altered. She opens eyes momentarily and utters words when spoken to.   Objective:  Vital signs in last 24 hours: Vitals:   08/25/19 0530 08/25/19 0600 08/25/19 0630 08/25/19 0643  BP: (!) 104/44 (!) 117/47 (!) 105/44   Pulse: 81 78 81   Resp: (!) 21 17 (!) 22   Temp:    97.6 F (36.4 C)  TempSrc:    Oral  SpO2: 100% 99% 100%   Weight:      Height:       Physical Exam Constitutional:      Appearance: She is obese. She is ill-appearing and toxic-appearing.  Cardiovascular:     Rate and Rhythm: Normal rate and regular rhythm.  Pulmonary:     Comments: Tachypnic, on 4 L [place] Auscultated anteriorly.  Abdominal:     General: Bowel sounds are normal.     Comments: Obese. Large right inguinal hernia noted  Skin:    General: Skin is dry.     Comments: Hands and feet are cool to touch. Dry peeling skin on neck  Neurological:     Mental Status: She is disoriented.     Comments: Patient responds by opening eyes to verbal stimuli     Assessment/Plan:  Active Problems:   Sacral ulcer ([place])  Valerie Keller is a 27 year coronary artery stent placement in 2019, HFrEF (EF 45-50%), HTN, HLD, T2DM, CKD stage III, fibromyalgia, anxiety, chronic lower back pain with radiculopathy, OSA, GERD, and inguinal hernia who presents with encephalopathy 2/2 septic shock.   #Encephalopathy 2/2 sepsis  #Purulent sacral wound  Patient comes in altered with large sacral would with black eschar. Lactic acid 3.6. Patient requiring fluid boluses to maintain pressure overnight. Discussion with family documented in other notes. Would not like to pursue surgery.  - Palliative Care consult - Continue IV Vancomycin, Cefepime, and Clindamycin - Cortisol level  - Follow blood cultures - Follow up wound cultures   #AKI on CKD  - Continue foley   #T2DM BG 96 this am  - SSI   Dispo: Anticipated discharge in approximately with clinical improvement.   Valerie Snider, MD PGY1  [phone]

## 2019-08-25 NOTE — Consult Note (Signed)
NAME:  Valerie Keller, MRN:  [id number], DOB:  [birth year], LOS: 1 ADMISSION DATE:  08/30/2019, CONSULTATION DATE:  08/25/2019 REFERRING MD: Dr. Lyn Hollingshead, CHIEF COMPLAINT:  Hypotension  Brief History   73 year old female with extensive history as below presenting from home with recent functional decline over several weeks after a fall with development of purulent sacral wound, poor PO intake, and now altered mental status.  Admitted to IMTS for sepsis, wound infection, and AKI however patient with ongoing hypotension despite IVF.  PCCM consulted for possible ICU admit and vasopressors.  Pt is DNR.   History of present illness   HPI obtained from medical chart review as patient's husband was not able to be reached by phone and no family at bedside.  Patient remains encephalopathic.   73 year old female with prior history of COPD, PE on Xarelto (11/16/2018), obesity, CAD with stent, HFrEF (TTE 1/20 EF 45-50% with diastolic dysfunction), HTN, HLD, DMT2, CKD stage III, fibromyalgia, chronic low back pain, anxiety, GERD, hallucinations (thought related to polypharmacy), depression, and inguinal hernia.  Lives at home with her husband.  Larey Seat a few weeks ago and since has had progressive functional decline, weakness, and fatigue with development of sacral ulcer which is now purulent.  Has had poor PO intake and developed altered mentals status.  In ER, patient initially hemodynamically stable and remained lethargic.  Found to have leukocytosis, lactate 2.4- 2.1, AKI, CK 690, sacral wound noted to have purulent drainage, CTH and cervical spine without acute findings, CT abd noted for few foci of gas within the gluteal cleft thought likely external, concerning for cellulitis and possible early necrotizing fasciitis.  Cultures sent and started on vancomycin, cefepime, and clindamycin.  Given IVF bolus with ongoing decline of blood pressure prompting PCCM consult for possible ICU admit and vasopressor support.   Patient is DNR, not new, prior DNR status on prior admit 10/2018.    Past Medical History  Reported COPD, former smoker, obesity, CAD with stent, HFrEF (TTE 1/20 EF 45-50% with diastolic dysfunction), HTN, HLD, DMT2, CKD stage III, fibromyalgia, chronic low back pain, anxiety, GERD, inguinal hernia, hallucinations, depression  Documented hx of OSA, 05/11/2019 sleep study without evidence of OSA, AHI 2, noted for nocturnal hypoxemia improved with 1L O2  Significant Hospital Events   11/5 Admitted  Consults:   Procedures:   Significant Diagnostic Tests:  11/4 CTH/ cervical  >> Atrophy with mild small vessel chronic ischemic changes of deep cerebral white matter. No acute intracranial abnormalities. Degenerative disc and facet disease changes of the cervical spine. No acute cervical spine abnormalities. Atherosclerotic calcifications involving the internal carotid and vertebral arteries as above.  11/4 CT abd/pelvis >> 1. Skin thickening and subcutaneous fat stranding involving thegluteal cleft which extends to the mildly thickened levator plate.  Few foci of gas within the gluteal cleft are likely external. No definite soft tissue gas or organized collection. Findings are concerning for a cellulitis but an aggressive soft tissue infection/necrotizing fasciitis cannot be excluded on an imaging basis. Recommend close evaluation and assessment of laboratorymarkers (such as a LRINEC score) to assess for the clinical features of necrotizing fasciitis. 2. Mild hazy stranding adjacent the cecum and proximal ascending colon with few reactive appearing lymph nodes. Findings are nonspecific, but could reflect mild colitis. 3. Some peribronchovascular opacity in the left lung base could reflect aspiration. 4. Compression deformities at T8 and 10, T12 and L1 are similar to comparison radiography from June of 2020 and MRI  from June 20, 2019 and MRI from June 20, 2019 from June 20, 2019 and MRI from  June 20, 2019. 5. Asymmetric right renal atrophy. Bilateral nonobstructing nephrolithiasis. 6. Aortic Atherosclerosis  Micro Data:  11/4 SARS CoV2 >> neg 11/4 UC >> 11/4 BCx2 >>  11/4 wound cx >>  Antimicrobials:  11/4 vanc 11/4 clinda 11/4 cefepime  Interim history/subjective:  Finishing 3L LR  On heparin gtt  Objective   Blood pressure (!) 101/42, pulse 80, resp. rate 20, height 5' (1.524 m), weight 96.2 kg, SpO2 100 %.        Intake/Output Summary (Last 24 hours) at 08/25/2019 0356 Last data filed at 08/25/2019 0118 Gross per 24 hour  Intake 500 ml  Output 900 ml  Net -400 ml   Filed Weights   09-06-2019 1700 2019/09/06 2126  Weight: 96.2 kg 96.2 kg    Examination: General:  Ill appearing obese female lying in bed  HEENT: MM dry/ pink, peeling lips, pupils 4/reactive, anicteric, some dried eye discharge Neuro: awakens to verbal, answers to her name only, able to follow simple commands, speech is slurred- mouth dry, MAE weakly CV: rr, no murmur PULM:  Non labored, on room air with normal saturations, clear anteriorly, diminished in bases GI: soft, NT, hypoBS, foley with good amount of amber urine, large panus, right soft inguinal hernia Extremities: warm/dry, no LE edema, left great toe discolored, +1 dp Skin: no rashes, posterior sacral ulcer not visualized   Resolved Hospital Problem list    Assessment & Plan:   Sepsis thought related to sacral wound/ cellulitis with concern for possible necrotizing fascitis  P:  - Would need to discuss vasopressor support with her husband first before we initiate vasopressors as this is a form of advanced life support and that patient is a DNR status.  We will not offer vasopressor at this time as patient remains fluid responsive and still appears under volume resuscitated.  She has had 3L NS up to this point.  Additional LR bolus 500 ml and re-evaluate.  Hold on MIVF.  Re-evaluate need for ongoing small fluid boluses given hx of  HFrEF however remains stable on room air in no distress abx per primary team.  CCS consult in am.  Follow culture data   AKI on CKD stage III - previous sCr 0.88 06/28/2019 P:  Making good urine Continue foley and strict I/O/ UOP Trend BMP/ mag/ phos Avoid nephrotoxic agents, ensure adequate renal perfusion   DMT2 P:  SSI per primary   Hx of PE on home Xarelto P:  Placed heparin gtt.  We have discontinued at this time given PE was 10/2018 and started on Xarelto for scheduled six months.  Unclear on chart review why patient has been continued on this.  Not indicated.  Given her falls and functional status, should not be on [facility] given high risk for bleeding complications  Documented COPD- [place], on our pulm office notes, PFTs were did not show obstruction, dx with restrictive lung disease secondary to her obesity.   P:  Monitor SpO2  Hx of hallucinations (thought related to polypharmacy), anxiety and depression P:  On multiple psych meds at home.  Is NPO at this time due to encephalopathy related to her sepsis.    Remainder per primary team.  Nothing further to add.  PCCM will sign off.  Please do not hesitate to call us back if we can be of any further assistance.  Best practice:  Diet: NPO Pain/Anxiety/Delirium protocol (if indicated): n/a VAP protocol (if indicated): n/a DVT prophylaxis: heparin SQ GI prophylaxis: n/a Glucose control: SSi Mobility: BR, will need PT/ OT Code Status: DNR Family Communication: attempted to call husband without answer Disposition: ok for PCU  Labs   CBC: Recent Labs  Lab 09/07/2019 1547 08/22/2019 1624  WBC 11.6*  --   NEUTROABS 8.7*  --   HGB 15.0 17.0*  HCT 46.3* 50.0*  MCV 97.5  --   PLT 234  --     Basic Metabolic Panel: Recent Labs  Lab 09/08/2019 1615 09/11/2019 1624  NA 139 139  K 4.1 3.9  CL 100  --   CO2 22  --   GLUCOSE 124*  --   BUN 25*  --   CREATININE 2.01*  --   CALCIUM 9.7  --     GFR: Estimated Creatinine Clearance: 25.5 mL/min (A) (by C-G formula based on SCr of 2.01 mg/dL (H)). Recent Labs  Lab 09/09/2019 1547 08/30/2019 2020  WBC 11.6*  --   LATICACIDVEN 2.4* 2.1*    Liver Function Tests: Recent Labs  Lab 08/21/2019 1615  AST 40  ALT 24  ALKPHOS 106  BILITOT 1.4*  PROT 7.1  ALBUMIN 2.9*   No results for input(s): LIPASE, AMYLASE in the last 168 hours. Recent Labs  Lab 09/11/2019 1755  AMMONIA 14    ABG    Component Value Date/Time   PHART 7.281 (L) 11/16/2018 0356   PCO2ART 65.3 (HH) 11/16/2018 0356   PO2ART 140 (H) 11/16/2018 0356   HCO3 28.5 (H) 09/12/2019 1624   TCO2 30 09/01/2019 1624   O2SAT 99.0 08/27/2019 1624     Coagulation Profile: Recent Labs  Lab 09/15/2019 1547  INR 1.7*    Cardiac Enzymes: Recent Labs  Lab 08/25/19 0012  CKTOTAL 690*    HbA1C: Hgb A1c MFr Bld  Date/Time Value Ref Range Status  04/01/2019 11:09 AM 6.9 (H) <5.7 % of total Hgb Final    Comment:    For someone without known diabetes, a hemoglobin A1c value of 6.5% or greater indicates that they may have  diabetes and this should be confirmed with a follow-up  test. . For someone with known diabetes, a value <7% indicates  that their diabetes is well controlled and a value  greater than or equal to 7% indicates suboptimal  control. A1c targets should be individualized based on  duration of diabetes, age, comorbid conditions, and  other considerations. . Currently, no consensus exists regarding use of hemoglobin A1c for diagnosis of diabetes for children. Marland Kitchen   11/15/2018 07:06 PM 7.2 (H) 4.8 - 5.6 % Final    Comment:    (NOTE) Pre diabetes:          5.7%-6.4% Diabetes:              >6.4% Glycemic control for   <7.0% adults with diabetes     CBG: Recent Labs  Lab 09/15/2019 2324 08/25/19 0304  GLUCAP 102* 99    Review of Systems:   unable  Past Medical History  She,  has a past medical history of Diabetes mellitus without  complication (HCC), Fibromyalgia, High blood pressure, High cholesterol, Lung disease, Myocardial infarction (HCC), Nephrolithiasis, Pulmonary embolus (HCC), and Renal disorder.   Surgical History    Past Surgical History:  Procedure Laterality Date   ABDOMINAL HYSTERECTOMY     CHOLECYSTECTOMY     CORONARY STENT PLACEMENT  x3   FOOT SURGERY     KIDNEY SURGERY     TONSILLECTOMY       Social History   reports that she quit smoking about 35 years ago. Her smoking use included cigarettes. She has a 8.00 pack-year smoking history. She has never used smokeless tobacco. She reports that she does not drink alcohol or use drugs.   Family History   Her family history includes CAD in her brother. There is no history of Diabetes or Cancer.   Allergies Allergies  Allergen Reactions   Morphine Itching and Rash   Ciprofloxacin Other (See Comments)    AMS   Latex Hives and Other (See Comments)    Other reaction(s): Other (See Comments)   Promethazine Other (See Comments)   Ibuprofen    Penicillins Hives    Did it involve swelling of the face/tongue/throat, SOB, or low BP? No Did it involve sudden or severe rash/hives, skin peeling, or any reaction on the inside of your mouth or nose? No Did you need to seek medical attention at a hospital or doctor's office? No When did it last happen? If all above answers are "NO", may proceed with cephalosporin use.      Home Medications  Prior to Admission medications   Medication Sig Start Date End Date Taking? Authorizing Provider  albuterol (PROVENTIL) (2.5 MG/3ML) 0.083% nebulizer solution Take 3 mLs by nebulization every 4 (four) hours as needed for shortness of breath. 11/02/18 06/08/19  Sunnie NielsenAlexander, Natalie, DO  AMBULATORY NON FORMULARY MEDICATION Blood pressure monitor, around upper arm, not wrist.  Per patient preference and insurance coverage. 11/26/18   Sunnie NielsenAlexander, Natalie, DO  AMBULATORY NON FORMULARY MEDICATION Nebulizer device  miscellaneous: Please include machine, necessary tubing and masks.  Dispensed per patient preference and insurance coverage.  Diagnosis COPD, pneumonia. 11/26/18   Sunnie NielsenAlexander, Natalie, DO  ARIPiprazole (ABILIFY) 10 MG tablet Take 10 mg by mouth daily.    [provider]  benztropine (COGENTIN) 0.5 MG tablet Take 0.5 mg by mouth 2 (two) times daily. 12/30/18   [provider]  brompheniramine-pseudoephedrine-DM 30-2-10 MG/5ML syrup Take 2.5-5 mLs by mouth 4 (four) times daily as needed (cough, congestion). 06/14/19   Sunnie NielsenAlexander, Natalie, DO  clonazepam (KLONOPIN) 2 MG disintegrating tablet Take 1 mg by mouth 3 (three) times daily as needed (anxiety). 10/19/18   [provider]  diphenoxylate-atropine (LOMOTIL) 2.5-0.025 MG tablet One to 2 tablets by mouth 4 times a day as needed for diarrhea. 01/27/19   Sunnie NielsenAlexander, Natalie, DO  DULoxetine (CYMBALTA) 60 MG capsule Take 120 mg by mouth daily.  12/01/17   [provider]  fluticasone Aleda Grana(FLONASE ALLERGY RELIEF) 50 MCG/ACT nasal spray Place 2 sprays into the nose daily as needed for allergies.    [provider]  furosemide (LASIX) 40 MG tablet Take 1 tablet (40 mg total) by mouth daily. As needed for swelling 06/14/19   Sunnie NielsenAlexander, Natalie, DO  HUMALOG KWIKPEN 100 UNIT/ML KwikPen Inject 7 Units into the skin 3 (three) times daily before meals. 09/30/18   [provider]  Insulin Pen Needle (PEN NEEDLES) 31G X 8 MM MISC For use with insulin pen qid as directed, please dispense per patient preference and insurance coverage 10/27/18   Sunnie NielsenAlexander, Natalie, DO  Ipratropium-Albuterol (COMBIVENT RESPIMAT) 20-100 MCG/ACT AERS respimat Inhale 1 puff into the lungs every 6 (six) hours as needed for wheezing. 11/02/18   Sunnie NielsenAlexander, Natalie, DO  ipratropium-albuterol (DUONEB) 0.5-2.5 (3) MG/3ML SOLN Take 3 mLs by nebulization every 2 (  two) hours as needed (wheeze, SOB). 11/26/18   Emeterio Reeve, DO  KLOR-CON M20 20 MEQ tablet TAKE 1  TABLET BY MOUTH EVERY DAY 08/02/19   Emeterio Reeve, DO  LANTUS SOLOSTAR 100 UNIT/ML Solostar Pen INJECT 34 UNITS INTO THE SKIN AT BEDTIME. 06/14/19   Emeterio Reeve, DO  losartan (COZAAR) 50 MG tablet Take 1 tablet (50 mg total) by mouth daily. 06/14/19 09/12/19  Emeterio Reeve, DO  Magnesium Oxide 400 (240 Mg) MG TABS Take 1-2 tablets by mouth See admin instructions. 1 tablet in the AM and 2 tablets in the evening 11/09/17   [provider]  Multiple Vitamin (MULTIVITAMIN) capsule Take 1 capsule by mouth daily.    [provider]  nitroGLYCERIN (NITROSTAT) 0.4 MG SL tablet Place 1 tablet (0.4 mg total) under the tongue every 5 (five) minutes as needed for chest pain. 12/06/18 12/07/19  Lelon Perla, MD  ondansetron (ZOFRAN-ODT) 8 MG disintegrating tablet Take 1 tablet (8 mg total) by mouth every 8 (eight) hours as needed for nausea. 11/26/18   Emeterio Reeve, DO  pregabalin (LYRICA) 50 MG capsule TAKE 1 CAPSULE (50 MG TOTAL) BY MOUTH 3 (THREE) TIMES DAILY AS NEEDED. 08/10/19   Silverio Decamp, MD  rOPINIRole (REQUIP) 2 MG tablet Take 6 mg by mouth at bedtime.  08/15/17 06/08/19  [provider]  rosuvastatin (CRESTOR) 40 MG tablet Take 40 mg by mouth daily.    [provider]  Vitamin D, Ergocalciferol, (DRISDOL) 1.25 MG (50000 UT) CAPS capsule Take 1 capsule (50,000 Units total) by mouth every 14 (fourteen) days. Patient not taking: Reported on 09/17/2019 11/02/18   Emeterio Reeve, DO  XARELTO 20 MG TABS tablet TAKE 1 TABLET (20 MG TOTAL) BY MOUTH DAILY WITH SUPPER FOR 30 DAYS. 07/26/19   Lelon Perla, MD       Kennieth Rad, MSN, AGACNP-BC [facility] 08/25/2019, 5:27 AM

## 2019-08-25 NOTE — Progress Notes (Signed)
Patient on Neo drip to support pressure until family arrived. Patient's family bedside. Discussed goals of care and family would like to move to comfort care.  Neodrip will be stopped. Orders will be put in for full comfort care. Family specific request are patient be left on supplemental oxygen.   [name], MD PGY1  [phone]

## 2019-08-26 ENCOUNTER — Encounter: Payer: Self-pay | Admitting: Pulmonary Disease

## 2019-08-26 ENCOUNTER — Encounter (HOSPITAL_COMMUNITY): Payer: Self-pay | Admitting: General Practice

## 2019-08-26 DIAGNOSIS — Z515 Encounter for palliative care: Secondary | ICD-10-CM

## 2019-08-26 DIAGNOSIS — L89159 Pressure ulcer of sacral region, unspecified stage: Secondary | ICD-10-CM

## 2019-08-26 DIAGNOSIS — Z7189 Other specified counseling: Secondary | ICD-10-CM

## 2019-08-26 NOTE — Consult Note (Signed)
Consultation Note Date: 08/26/2019   Patient Name: Valerie Keller  DOB: [birth year]  MRN: [id number]  Age / Sex: 73 y.o., female  PCP: Sunnie Nielsen, DO Referring Physician: Gust Rung, DO  Reason for Consultation: Establishing goals of care and Terminal Care  HPI/Patient Profile: 73 y.o. female  with past medical history of restrictive lung disease, CAD, CHF, HTN, DM2, CKDIII admitted on 09/06/2019 with increasing confusion. Found to have large sacral ulcer infected and necrotizing. Workup revealed septic shock. Surgery was consulted- however, after discussing GOC with primary team- decision was made to transition to full comfort care. Palliative medicine consulted to assist with terminal care.    Clinical Assessment and Goals of Care: Evaluated patient at bedside. Patient opens eyes to touch, but quickly closes them. Respirations unlabored with some pauses. Husband was sitting at bedside. He stated that he felt patient was comfortable. His hope if for her to die peacefully, without struggling to breath, without being in pain. His daughter and family have come in to provide support as well.  I discussed symptom management and signs of discomfort to look for. Gave emotional support.  Primary Decision Maker NEXT OF KIN- spouse    SUMMARY OF RECOMMENDATIONS -Continue current comfort interventions as ordered- reviewed symptom management with patient's nurse- ok to give bolus of dilaudid from drip or IV lorazepam if family asks -Patient has O2 in place- per spouse she did wear nasal cannula at home will continue since this is patient's baseline- but do not increase- give opioid boluses for signs of SOB.    Code Status/Advance Care Planning:  DNR  Palliative Prophylaxis:   Frequent Pain Assessment  Additional Recommendations (Limitations, Scope, Preferences):  Full Comfort Care  Prognosis:     Hours - Days  Discharge Planning: Anticipated Hospital Death  Primary Diagnoses: Present on Admission: . Sacral ulcer (HCC) . Septic shock (HCC) . Necrotizing soft tissue infection . Type 2 diabetes mellitus with hyperglycemia (HCC) . Morbid obesity with alveolar hypoventilation (HCC) . Sepsis (HCC)   I have reviewed the medical record, interviewed the patient and family, and examined the patient. The following aspects are pertinent.  Past Medical History:  Diagnosis Date  . Diabetes mellitus without complication (HCC)   . Fibromyalgia   . High blood pressure   . High cholesterol   . Lung disease   . Myocardial infarction (HCC)   . Nephrolithiasis   . Pulmonary embolus (HCC)   . Renal disorder   . Sepsis (HCC) 08/2019   Social History   Socioeconomic History  . Marital status: Married    Spouse name: Dennie Fetters  . Number of children: 1  . Years of education: Not on file  . Highest education level: High school graduate  Occupational History    Comment: na  Social Needs  . Financial resource strain: Not on file  . Food insecurity    Worry: Not on file    Inability: Not on file  . Transportation needs    Medical: Not on file  Non-medical: Not on file  Tobacco Use  . Smoking status: Former Smoker    Packs/day: 0.25    Years: 32.00    Pack years: 8.00    Types: Cigarettes    Quit date: 10/21/1983    Years since quitting: 35.8  . Smokeless tobacco: Never Used  Substance and Sexual Activity  . Alcohol use: Never    Frequency: Never  . Drug use: Never  . Sexual activity: Not Currently  Lifestyle  . Physical activity    Days per week: Not on file    Minutes per session: Not on file  . Stress: Not on file  Relationships  . Social Musician on phone: Not on file    Gets together: Not on file    Attends religious service: Not on file    Active member of club or organization: Not on file    Attends meetings of clubs or organizations: Not on file     Relationship status: Not on file  Other Topics Concern  . Not on file  Social History Narrative   Lives with spouse   Caffeine- coffee, tea, soda- one daily or less   Family History  Problem Relation Age of Onset  . CAD Brother   . Diabetes Neg Hx   . Cancer Neg Hx    Scheduled Meds: .  HYDROmorphone (DILAUDID) injection  1 mg Intravenous Once   Continuous Infusions: . HYDROmorphone 0.5 mg/hr (08/25/19 2112)  . lactated ringers Stopped (08/25/19 1825)   PRN Meds:.acetaminophen **OR** acetaminophen, antiseptic oral rinse, glycopyrrolate **OR** glycopyrrolate **OR** glycopyrrolate, haloperidol **OR** haloperidol **OR** haloperidol lactate, HYDROmorphone, LORazepam **OR** LORazepam **OR** LORazepam, ondansetron **OR** ondansetron (ZOFRAN) IV, polyvinyl alcohol Medications Prior to Admission:  Prior to Admission medications   Medication Sig Start Date End Date Taking? Authorizing Provider  benztropine (COGENTIN) 0.5 MG tablet Take 0.5 mg by mouth 2 (two) times daily. 12/30/18  Yes [provider]  clonazepam (KLONOPIN) 2 MG disintegrating tablet Take 2 mg by mouth 3 (three) times daily as needed (anxiety).  10/19/18  Yes [provider]  diphenoxylate-atropine (LOMOTIL) 2.5-0.025 MG tablet One to 2 tablets by mouth 4 times a day as needed for diarrhea. Patient taking differently: Take 1-2 tablets by mouth 4 (four) times daily as needed for diarrhea or loose stools.  01/27/19  Yes Sunnie Nielsen, DO  DULoxetine (CYMBALTA) 60 MG capsule Take 120 mg by mouth daily.  12/01/17  Yes [provider]  fluticasone (FLONASE ALLERGY RELIEF) 50 MCG/ACT nasal spray Place 2 sprays into the nose daily as needed for allergies.    Yes [provider]  furosemide (LASIX) 40 MG tablet Take 1 tablet (40 mg total) by mouth daily. As needed for swelling Patient taking differently: Take 40 mg by mouth daily as needed (swelling).  06/14/19  Yes Sunnie Nielsen, DO  HUMALOG  KWIKPEN 100 UNIT/ML KwikPen Inject 7 Units into the skin 3 (three) times daily before meals. 09/30/18  Yes [provider]  KLOR-CON M20 20 MEQ tablet TAKE 1 TABLET BY MOUTH EVERY DAY Patient taking differently: Take 20 mEq by mouth daily.  08/02/19  Yes Alexander, Natalie, DO  LANTUS SOLOSTAR 100 UNIT/ML Solostar Pen INJECT 34 UNITS INTO THE SKIN AT BEDTIME. Patient taking differently: Inject 32 Units into the skin at bedtime.  06/14/19  Yes Sunnie Nielsen, DO  losartan (COZAAR) 50 MG tablet Take 1 tablet (50 mg total) by mouth daily. 06/14/19 09/12/19 Yes Sunnie Nielsen, DO  Multiple  Vitamin (MULTIVITAMIN) capsule Take 1 capsule by mouth daily.   Yes [provider]  nitroGLYCERIN (NITROSTAT) 0.4 MG SL tablet Place 1 tablet (0.4 mg total) under the tongue every 5 (five) minutes as needed for chest pain. 12/06/18 12/07/19 Yes Lelon Perla, MD  pregabalin (LYRICA) 50 MG capsule TAKE 1 CAPSULE (50 MG TOTAL) BY MOUTH 3 (THREE) TIMES DAILY AS NEEDED. Patient taking differently: Take 50 mg by mouth 3 (three) times daily as needed (pain).  08/10/19  Yes Silverio Decamp, MD  rOPINIRole (REQUIP) 2 MG tablet Take 2 mg by mouth at bedtime.  08/15/17 08/25/19 Yes [provider]  rosuvastatin (CRESTOR) 40 MG tablet Take 40 mg by mouth daily.   Yes [provider]  XARELTO 20 MG TABS tablet TAKE 1 TABLET (20 MG TOTAL) BY MOUTH DAILY WITH SUPPER FOR 30 DAYS. Patient taking differently: Take 20 mg by mouth at bedtime.  07/26/19  Yes Lelon Perla, MD  AMBULATORY NON FORMULARY MEDICATION Blood pressure monitor, around upper arm, not wrist.  Per patient preference and insurance coverage. 11/26/18   Emeterio Reeve, DO  AMBULATORY NON FORMULARY MEDICATION Nebulizer device miscellaneous: Please include machine, necessary tubing and masks.  Dispensed per patient preference and insurance coverage.  Diagnosis COPD, pneumonia. 11/26/18   Emeterio Reeve, DO  Insulin  Pen Needle (PEN NEEDLES) 31G X 8 MM MISC For use with insulin pen qid as directed, please dispense per patient preference and insurance coverage 10/27/18   Emeterio Reeve, DO   Allergies  Allergen Reactions  . Morphine Itching and Rash  . Ciprofloxacin Other (See Comments)    AMS  . Latex Hives and Other (See Comments)    Other reaction(s): Other (See Comments)  . Promethazine Other (See Comments)  . Ibuprofen   . Penicillins Hives    Did it involve swelling of the face/tongue/throat, SOB, or low BP? No Did it involve sudden or severe rash/hives, skin peeling, or any reaction on the inside of your mouth or nose? No Did you need to seek medical attention at a hospital or doctor's office? No When did it last happen? If all above answers are "NO", may proceed with cephalosporin use.    Review of Systems  Unable to perform ROS: Acuity of condition    Physical Exam Vitals signs and nursing note reviewed.  Constitutional:      Appearance: She is ill-appearing.  Pulmonary:     Effort: Pulmonary effort is normal.  Skin:    Coloration: Skin is pale.     Comments: grey     Vital Signs: BP (!) 62/22   Pulse 83   Temp 99 F (37.2 C)   Resp 18   Ht 5' (1.524 m)   Wt 96.2 kg   SpO2 (!) 55%   BMI 41.42 kg/m  Pain Scale: Faces   Pain Score: Asleep   SpO2: SpO2: (!) 55 % O2 Device:SpO2: (!) 55 % O2 Flow Rate: .O2 Flow Rate (L/min): 3 L/min  IO: Intake/output summary:   Intake/Output Summary (Last 24 hours) at 08/26/2019 1309 Last data filed at 08/25/2019 1514 Gross per 24 hour  Intake 550 ml  Output -  Net 550 ml    LBM:   Baseline Weight: Weight: 96.2 kg Most recent weight: Weight: 96.2 kg     Palliative Assessment/Data: PPS: 10%     Thank you for this consult. Palliative medicine will continue to follow and assist as needed.   Time In: 0945 Time Out:  1100 Time Total: 75 minutes Greater than 50%  of this time was spent counseling and coordinating care  related to the above assessment and plan.  Signed by: Ocie BobKasie Mahan, AGNP-C Palliative Medicine    Please contact Palliative Medicine Team phone at [phone] for questions and concerns.  For individual provider: See Loretha StaplerAmion

## 2019-08-26 NOTE — Addendum Note (Signed)
Addended by: Elton Sin on: 08/26/2019 08:46 AM   Modules accepted: Orders

## 2019-08-26 NOTE — Progress Notes (Signed)
Nutrition Brief Note RD working remotely. Chart reviewed. Pt now transitioning to comfort care.  No further nutrition interventions warranted at this time.  Please re-consult as needed.   Flo Berroa A. Ioana Louks, RD, LDN, CDCES Registered Dietitian II Certified Diabetes Care and Education Specialist Pager: [phone] After hours Pager: [phone]  

## 2019-08-26 NOTE — Patient Instructions (Signed)
I have reviewed the sleep study results does not show any evidence of sleep apnea.  You do have some low oxygen levels at home for which we will order 1 L oxygen at night  We are still awaiting scheduling of pulmonary function test Follow-up in 3 months.

## 2019-08-26 NOTE — Progress Notes (Signed)
   Subjective:   Valerie Keller was seen sleeping this morning with family at bedside. Spoke with patient's family and they are very happy about their care. They just request that that the patient's vitals be recorded every 2-3 hours and also request that the care team be mindful about the patient not having any air hunger.  Family mentions that she is less responsive this morning.   Dr. Heber Bremer has been in touch with patient's pcp.    Objective:  Vital signs in last 24 hours: Vitals:   08/25/19 2345 08/26/19 0207 08/26/19 0424 08/26/19 0601  BP: (!) 92/40 (!) 68/37 (!) 84/48 (!) 94/43  Pulse: 79 76 75 61  Resp:      Temp:  98.4 F (36.9 C)  97.8 F (36.6 C)  TempSrc:  Oral  Oral  SpO2: 100%  96%   Weight:      Height:         Physical Exam  Constitutional:  Somnolent, in no acute distress  Cardiovascular: Normal rate and regular rhythm.  Pulmonary/Chest:  Breathing normal,on 3 L  for comfort  Skin: Skin is warm and dry.     Assessment/Plan:  Principal Problem:   Septic shock (HCC) Active Problems:   Morbid obesity with alveolar hypoventilation (HCC)   Type 2 diabetes mellitus with hyperglycemia (HCC)   Sacral ulcer (HCC)   Sepsis ([place])   Necrotizing soft tissue infection  Valerie Keller is a 68 year coronary artery stent placement in 2019, HFrEF (EF 45-50%), HTN, HLD, T2DM, CKD stage III, fibromyalgia, anxiety, chronic lower back pain with radiculopathy, OSA, GERD, and inguinal hernia who presents with encephalopathy 2/2 septic shock.   #Encephalopathy 2/2 sepsis  #Purulent sacral wound  Patient comes in altered with large sacral would with black eschar. Lactic acid 3.6. Patient requiring fluid boluses to maintain pressure and a Neo drip until patients family could travel from out of town. Discussion with family documented in other notes. Would not like to pursue surgery and would like to pursue comfort care. Blood cultures NGTD. Wound cultures pending. Nl cortisol .  Abx stopped. Patients family bedside and has requested BP be checked every 2 hours.   - Comfort Care orders are in - Palliative Care consult   #AKI on CKD  - Continue foley   #T2DM - SSI  Dispo: [place]  [name], MD PGY1  [phone]

## 2019-08-26 NOTE — Progress Notes (Signed)
ED RN states that the IVF (lactated ringer's) has been cut off per MD as the decision for comfort care has been made. Dilaudid infusion continues.   Pt not monitored on telemetry per comfort measures. Family requests Q2 hour vital signs to monitor pt's status. Continuous pulse ox applied to patient per family request. Family requests that staff not say "comfort care" in front of the pt as the pt does not realize that she is on comfort measures.  Request for comfort care (snack) cart made by this RN to service response. Security to bring cart tonight if available.

## 2019-08-26 NOTE — Progress Notes (Signed)
Patient daughter concern that patient is becoming more alert, coughing and opening her eyes, while taking B/P patient did open her eyes but did not move or speak. Patient looks comfortable. MD Court Joy notified per family request.  Per MD, palliative will be coming to see patient.

## 2019-08-26 NOTE — TOC Initial Note (Signed)
Transition of Care [facility]) - Initial/Assessment Note    Patient Details  Name: Valerie Keller MRN: [id number] Date of Birth: Apr 29, 1946  [facility]) CM/SW Contact:    Leone Haven, RN Phone Number: 08/26/2019, 4:39 PM  Clinical Narrative:                 Patient is comfort care, hospital expiration expected.  Expected Discharge Plan: (comfort care) Barriers to Discharge: No Barriers Identified   Patient Goals and CMS Choice Patient states their goals for this hospitalization and ongoing recovery are:: comfort care      Expected Discharge Plan and Services Expected Discharge Plan: (comfort care)                                              Prior Living Arrangements/Services                       Activities of Daily Living      Permission Sought/Granted                  Emotional Assessment              Admission diagnosis:  Generalized weakness [R53.1] Pressure injury of skin of sacral region, unspecified injury stage [L89.159] Sepsis [facility]) [A41.9] Patient Active Problem List   Diagnosis Date Noted  . Terminal care   . Palliative care by specialist   . Goals of care, counseling/discussion   . Septic shock (HCC) 08/25/2019  . Necrotizing soft tissue infection 08/25/2019  . Sepsis (HCC)   . Progressive neurologic decline   . Chronic respiratory failure with hypoxia (HCC)   . Sacral ulcer (HCC) 09/06/2019  . Left lumbar radiculopathy 04/12/2019  . Parathyroid hormone excess (HCC) 04/07/2019  . Hypercalcemia 04/07/2019  . Primary osteoarthritis of left knee 04/01/2019  . Lumbar spondylosis 04/01/2019  . Acute on chronic respiratory failure with hypoxia (HCC) 11/15/2018  . COPD with acute exacerbation (HCC) 11/15/2018  . Hypercapnia 11/15/2018  . Community acquired pneumonia of left lung   . Class 3 severe obesity due to excess calories in adult (HCC) 10/07/2018  . Hypokalemia 06/25/2018  . Inguinal hernia  06/25/2018  . Coronary artery disease with history of myocardial infarction without history of CABG 05/14/2018  . S/P coronary artery stent placement 05/14/2018  . Female pelvic pain 05/11/2018  . Type 2 diabetes mellitus with hyperglycemia (HCC) 04/12/2018  . Atrophic vaginitis 04/15/2017  . Angina pectoris (HCC) 02/10/2017  . Hyponatremia 02/10/2017  . Vitamin D deficiency 09/01/2016  . Fibromyalgia 08/01/2016  . Anxiety 01/04/2016  . Candidal skin infection 01/04/2016  . Chronic obstructive pulmonary disease, unspecified (HCC) 01/04/2016  . Hyperlipidemia, unspecified 01/04/2016  . Fatigue 06/12/2015  . Morbid obesity with alveolar hypoventilation (HCC) 11/03/2014  . Cardiomyopathy, ischemic 05/16/2014  . Obstructive sleep apnea (adult) (pediatric) 02/18/2013  . Left ventricular diastolic dysfunction 10/06/2012  . Hypomagnesemia 09/09/2012  . Chronic kidney disease, stage 3 (moderate) 08/20/2012  . Depression with anxiety 08/20/2012  . Essential hypertension 08/20/2012  . Gastro-esophageal reflux disease without esophagitis 08/20/2012  . Calculus of kidney 08/20/2012   PCP:  Sunnie Nielsen, DO Pharmacy:   [id number] - [place], [place] - [address] [place] [address] Phone: [phone] Fax: [phone]     Social Determinants of Health (SDOH)  Interventions    Readmission Risk Interventions Readmission Risk Prevention Plan 08/26/2019  Transportation Screening (No Data)  PCP or Specialist Appt within 3-5 Days (No Data)  HRI or [place] (No Data)  Palliative Care Screening Complete  Medication Review (RN Care Manager) (No Data)  Some recent data might be hidden

## 2019-08-26 NOTE — Progress Notes (Signed)
Virtual Visit via Telephone Note  I connected with Valerie Keller on 08/26/19 at  1:30 PM EST by telephone and verified that I am speaking with the correct person using two identifiers.  Location: Patient: Home Provider: [facility], [place], [place]   I discussed the limitations, risks, security and privacy concerns of performing an evaluation and management service by telephone and the availability of in person appointments. I also discussed with the patient that there may be a patient responsible charge related to this service. The patient expressed understanding and agreed to proceed.   History of Present Illness: Follow-up for presumed COPD, OSA   73 y.o.femalewith a history of suspected COPD on intermittent oxygen chronically, OSA, stage III CKD, obesity, T2DM, fibromyalgia, and CAD s/p PCI  Admitted to the hospital in January 2020 with dyspnea.  Found to have subsegmental PE and discharged on Xarelto.  Also given prednisone, azithro for presumed COPD  Patient was diagnosed with COPD many years ago in [place].  She was evaluated again in [place] in 2019.  PFTs at that time did not show any obstruction.  She was diagnosed with restrictive lung disease due to obesity and obesity hypoventilation syndrome, OSA.  She is intolerant of CPAP  Pets: No pets Occupation: Retired Network engineer Exposures: No known exposures.  No mold, hot tub, Jacuzzi. Smoking history:8-pack-year smoker.  Quit in 1985 Travel history: No significant recent travel Relevant family history: No family history of lung disease.  Observations/Objective: Spoke with husband over telephone.  No complains of dyspnea, cough or sputum production.  No fevers or chills.  Sleep study 05/11/2019-no evidence of sleep apnea.  Overall AHI 2 Low O2 sats of 74% requiring 1 L oxygen at night  Assessment and Plan: Evaluation for COPD Although she carries a diagnosis of COPD she has minimal smoking history.   Apparently PFTs in [place] did not show any evidence of obstruction  She likely has restrictive lung disease due to body habitus and obesity PFTs ordered at last visit but delayed due to Covid restrictions.  Will await these results.  Do not feel any inhaler treatment is needed at present  OSA/OHS Diagnosed many years ago Reviewed repeat sleep study with no evidence of sleep apnea.  She does have some desats at night for which she will need supplemental oxygen.  Follow Up Instructions: - Awaiting PFTs - Supplemental oxygen 1lt O2 for nocturnal hypoxia.   I discussed the assessment and treatment plan with the patient. The patient was provided an opportunity to ask questions and all were answered. The patient agreed with the plan and demonstrated an understanding of the instructions.   The patient was advised to call back or seek an in-person evaluation if the symptoms worsen or if the condition fails to improve as anticipated.  I provided 25 minutes of non-face-to-face time during this encounter.   Marshell Garfinkel MD [facility] 08/26/2019, 8:33 AM

## 2019-08-27 ENCOUNTER — Other Ambulatory Visit: Payer: Self-pay | Admitting: Osteopathic Medicine

## 2019-08-27 MED ORDER — LORAZEPAM 2 MG/ML IJ SOLN
2.0000 mg | INTRAMUSCULAR | Status: DC
  Administered 2019-08-27 – 2019-08-29 (×9): 2 mg via INTRAVENOUS
  Filled 2019-08-27 (×9): qty 1

## 2019-08-27 MED ORDER — OXYCODONE HCL 20 MG/ML PO CONC: 20.0000 mg | ORAL | Status: DC

## 2019-08-27 MED ORDER — HYDROMORPHONE BOLUS VIA INFUSION
2.0000 mg | INTRAVENOUS | Status: DC | PRN
  Administered 2019-08-27: 2 mg via INTRAVENOUS
  Filled 2019-08-27: qty 2

## 2019-08-27 MED ORDER — LORAZEPAM 2 MG/ML PO CONC: 2.0000 mg | ORAL | Status: DC

## 2019-08-27 MED ORDER — OXYCODONE HCL 20 MG/ML PO CONC: 20.0000 mg | ORAL | Status: DC | PRN

## 2019-08-27 NOTE — Telephone Encounter (Signed)
Forwarding medication refill request to PCP for review. 

## 2019-08-27 NOTE — Progress Notes (Signed)
Replacement bag of IV dilaudid handed off to Gordon, Therapist, sports.

## 2019-08-27 NOTE — Progress Notes (Signed)
After speaking with palliative NP about losing IV access I was able to maintain IV. Therefore I have been able to continue administering  medications IV. I discontinued the scheduled PO medications since the IV medications were still ordered and the patient has been able to continue receiving her meds IV.

## 2019-08-27 NOTE — Progress Notes (Signed)
Palliative-   Patient in bed with daughter and spouse at bedside. RR increased and moaning. Per RN had episode of seizure like activity earlier. Daughter and spouse express desire for patient's comfort.   Assessment- patient actively dying, comfort measures only  Plan- Increase dilaudid bolus to 2mg  q10min PRN for increased RR effort or other signs of discomfort.   -Scheduled lorazepam 2mg  IV q4hr for sedation and seizure prophylaxis  Patient is not stable for transfer out of the hospital.  Anticipate hospital death in hours to days.   Mariana Kaufman, AGNP-C Palliative Medicine  Please call Palliative Medicine team phone with any questions [phone]. For individual providers please see AMION.  Time In: 1440 Time Out:1515 Total Time:35 minutes  Greater than 50%  of this time was spent counseling and coordinating care related to the above assessment and plan

## 2019-08-27 NOTE — Progress Notes (Signed)
Patient resting comfortably in bed with family at bedside. Family does not want me to turn patient at this time and state that they have been doing oral care on the patient.

## 2019-08-27 NOTE — Progress Notes (Signed)
Patient had almost seizure like episode, PRN Ativan was given. Dilaudid gtt increased due to multiple boluses given to help patient remain more comfortable.

## 2019-08-27 NOTE — Progress Notes (Signed)
   Subjective: [name] appeared comfortable on my evaluation this morning. Family was at bedside and satisfied with her care thus far. They did not indicate she was in any pain or having air hunger.   Objective:  Vital signs in last 24 hours: Vitals:   08/26/19 0753 08/26/19 1047 08/26/19 1137 08/27/19 0657  BP: (!) 79/25 (!) 72/22 (!) 62/22 (!) 73/37  Pulse: 79  83 83  Resp: 18     Temp: (!) 97.3 F (36.3 C)  99 F (37.2 C)   TempSrc: Oral     SpO2: 99%  (!) 55% 98%  Weight:      Height:       General: resting comfortably in bed, NAD Pulm: no increased work of breathing   Assessment/Plan:  Principal Problem:   Septic shock (HCC) Active Problems:   Morbid obesity with alveolar hypoventilation (HCC)   Type 2 diabetes mellitus with hyperglycemia ([place])   Sacral ulcer ([place])   Sepsis ([place])   Necrotizing soft tissue infection   Terminal care   Palliative care by specialist   Goals of care, counseling/discussion  Valerie Keller is a 42 year coronary artery stent placement in 2019, HFrEF (EF 45-50%), HTN, HLD, T2DM, CKD stage III, fibromyalgia, anxiety, chronic lower back pain with radiculopathy, OSA, GERD, and inguinal hernia who presents with encephalopathy 2/2 septic shock in the setting of a large infected sacral wound. Patient was requiring pressor support to maintain MAPs. After several discussions with family, they did not want to proceed with surgical intervention and elected to transition patient to comfort care.   Septic Shock Sacral ulcer End of life care - appreciate palliative care following - she appears comfortable on exam this morning - continue dilaudid infusion with boluses prn air hunger or pain - continue frequent pain and symptom assessments.   Dispo: Anticipated hospital death in hours to days.   Modena Nunnery D, DO 08/27/2019, 11:14 AM Pager: [phone]

## 2019-08-28 DIAGNOSIS — Z515 Encounter for palliative care: Secondary | ICD-10-CM

## 2019-08-28 DIAGNOSIS — R6521 Severe sepsis with septic shock: Secondary | ICD-10-CM

## 2019-08-28 DIAGNOSIS — M7989 Other specified soft tissue disorders: Secondary | ICD-10-CM

## 2019-08-28 MED ORDER — GLYCOPYRROLATE 0.2 MG/ML IJ SOLN
0.4000 mg | Freq: Three times a day (TID) | INTRAMUSCULAR | Status: DC
  Administered 2019-08-28 (×2): 0.4 mg via INTRAVENOUS
  Filled 2019-08-28 (×3): qty 2

## 2019-08-28 NOTE — Progress Notes (Signed)
   Subjective: Visited Ms. Stills, her husband and daughter at bedside. They noted she had some mild distress yesterday afternoon with possible seizure activity but she has been comfortable since medications had been increased by palliative care team. They are satisfied with how comfortable she appears. Encouraged them again to reach out if they ever feels she is in any pain or distress.   Objective:  Vital signs in last 24 hours: Vitals:   08/27/19 0657 08/27/19 0800 08/27/19 1000 08/27/19 1132  BP: (!) 73/37 (!) 66/34 (!) 72/34 (!) 72/35  Pulse: 83 79 80 81  Resp:    17  Temp:    98.5 F (36.9 C)  TempSrc:    Axillary  SpO2: 98%   94%  Weight:      Height:       General: resting comfortably in bed, NAD Pulm: no increased work of breathing; mildly gargling secretions   Assessment/Plan:  Principal Problem:   Septic shock (HCC) Active Problems:   Morbid obesity with alveolar hypoventilation (HCC)   Type 2 diabetes mellitus with hyperglycemia ([place])   Sacral ulcer ([place])   Sepsis ([place])   Necrotizing soft tissue infection   Terminal care   Palliative care by specialist   Goals of care, counseling/discussion  Valerie Keller is a 73 year coronary artery stent placement in 2019, HFrEF (EF 45-50%), HTN, HLD, T2DM, CKD stage III, fibromyalgia, anxiety, chronic lower back pain with radiculopathy, OSA, GERD, and inguinal hernia who presents with encephalopathy 2/2 septic shock in the setting of a large infected sacral wound. Patient was requiring pressor support to maintain MAPs. After several discussions with family, they did not want to proceed with surgical intervention and elected to transition patient to comfort care.   Septic Shock Sacral ulcer End of life care - appreciate palliative care following - she appears comfortable on exam this morning - continue dilaudid infusion with boluses prn air hunger or pain - Robinul prn secretions  - continue frequent pain and symptom  assessments.   Dispo: Anticipated hospital death in hours to days.   Modena Nunnery D, DO 08/28/2019, 9:49 AM Pager: [phone]

## 2019-08-28 NOTE — Progress Notes (Addendum)
Palliative Follow up.  Patient is lying comfortably in bed.  No longer responsive.  Eyes close.  Audible secretions.  Husband and daughter are at bedside.  They are exceedingly complimentary of the nurses and techs - so grateful.  Husband jokes that he is going to go out and get sick so that he can come in and be cared for on 3E.  We discussed pulse ox, BP cuff, oxygen.  Dtr explains that she has high stress and has requested that these things remain in place in order to help her maintain a sense of control and calm.  Husband tells me about his mother in law passing away.  When oxygen was removed she went into severe distress that was not treated.  He stated it haunted the patient for the rest of her life.     Consequently oxygen, pulse ox and BP cuff will remain in place.  Discussed care with RN, Lattie Haw.  Patient with "death rattle" more secretions.  Robinul given.  PE:  Well developed female, actively dying.  Appears comfortable. No increased work of breathing or tachycardia. Abdomen obese, soft, nt, nd LE without edema Foley in place - perhaps 3ml of urine.  Assessment:  Actively dying.  Comfortable.  Recommendations:  Will schedule robinul at increased dose. Leave oxygen and pulse ox in place. Appreciate great RN and Tech care.  Disposition:  Hospital death predicted in hours to days.  Florentina Jenny, PA-C Palliative Medicine [phone]  15 min. Greater than 50%  of this time was spent counseling and coordinating care related to the above assessment and plan

## 2019-08-29 LAB — CULTURE, BLOOD (ROUTINE X 2): Culture: NO GROWTH

## 2019-08-30 LAB — AEROBIC/ANAEROBIC CULTURE W GRAM STAIN (SURGICAL/DEEP WOUND)
Gram Stain: NONE SEEN
Special Requests: NORMAL

## 2019-09-07 ENCOUNTER — Ambulatory Visit: Payer: Medicare Other | Admitting: Cardiology

## 2019-09-09 ENCOUNTER — Ambulatory Visit: Payer: Medicare Other | Admitting: Osteopathic Medicine

## 2019-09-20 NOTE — Progress Notes (Signed)
Dilaudid IV on 110ml, wasted 60 mls with mike RN. Disposed appropriately.

## 2019-09-20 NOTE — Progress Notes (Signed)
Pt expired at 0419, pronounced by 2 RN's per order,  MD notified, [facility] was called, post mortem care done, paged MD for the certificate of death to be done, thanks Beola Cord RN.

## 2019-09-20 NOTE — Death Summary Note (Addendum)
  Name: Valerie Keller MRN: [id number] DOB: 05/11/1946 73 y.o.  Date of Admission: 09/03/2019  3:28 PM Date of Discharge: 08/30/2019 Attending Physician: Joni Reining C Discharge Diagnosis: Principal Problem:   Septic shock ([place]) Active Problems:   Morbid obesity with alveolar hypoventilation (HCC)   Type 2 diabetes mellitus with hyperglycemia (HCC)   Sacral ulcer (HCC)   Sepsis ([place])   Necrotizing soft tissue infection   Terminal care   Palliative care by specialist   Goals of care, counseling/discussion   Palliative care encounter   Cause of death: Septic shock Time of death: 0762 on 09-16-19  Disposition and follow-up:   Valerie Keller was [facility] from [facility] in expired condition.    Hospital Course: [name] is a 89 year coronary artery stent placement in 2019, HFrEF (EF 45-50%), HTN, HLD, T2DM, CKD stage III, fibromyalgia, anxiety, chronic lower back pain with radiculopathy, OSA, GERD, and inguinal hernia who presents with encephalopathy 2/2 septic shock.    #Encephalopathy 2/2 septic shock #Purulent sacral wound  Patient comes in altered with large sacral would with black eschar. Lactic acid 3.6. Patient requiring fluid boluses to maintain pressure and a Neo drip until patients family could travel from out of town. After discussions with patient family the decision was made to move to comfort care. Patient daughter and husband were able to spend time at bedside with patient. Patient expired 0419 on 09/16/19.    - Comfort Care orders are in - Palliative Care consult     Signed:  [name], MD PGY1  [phone]

## 2019-09-20 DEATH — deceased

## 2020-03-31 IMAGING — DX DG LUMBAR SPINE COMPLETE 4+V
5 series · 5 of 5 positions shown · non-contrast
Comparison: 02/01/2018 CT of head, cervical spine, thoracic spine,
lumbar spine.

CLINICAL DATA: 72 y/o  F; fall with back and neck pain.

EXAM:
THORACIC SPINE - 3 VIEWS; CERVICAL SPINE - COMPLETE 4+ VIEW; LUMBAR
SPINE - COMPLETE 4+ VIEW

[l-spine ap]
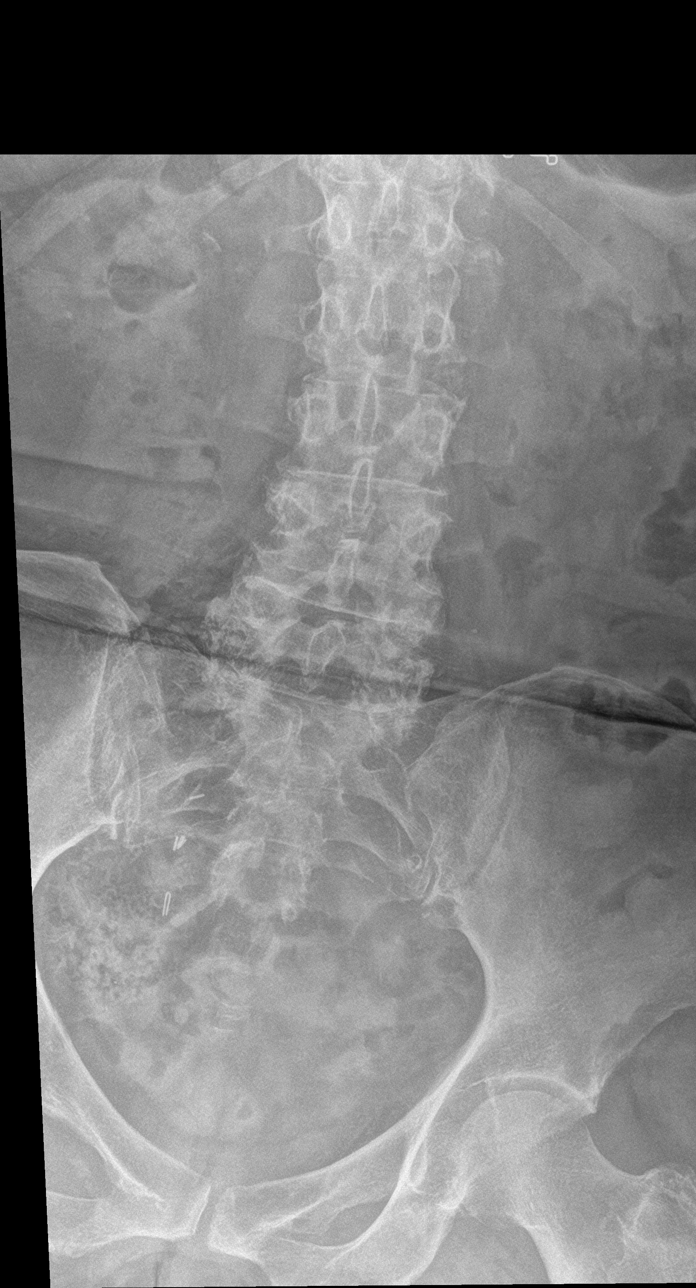

[l-spine obl (1 of 2)]
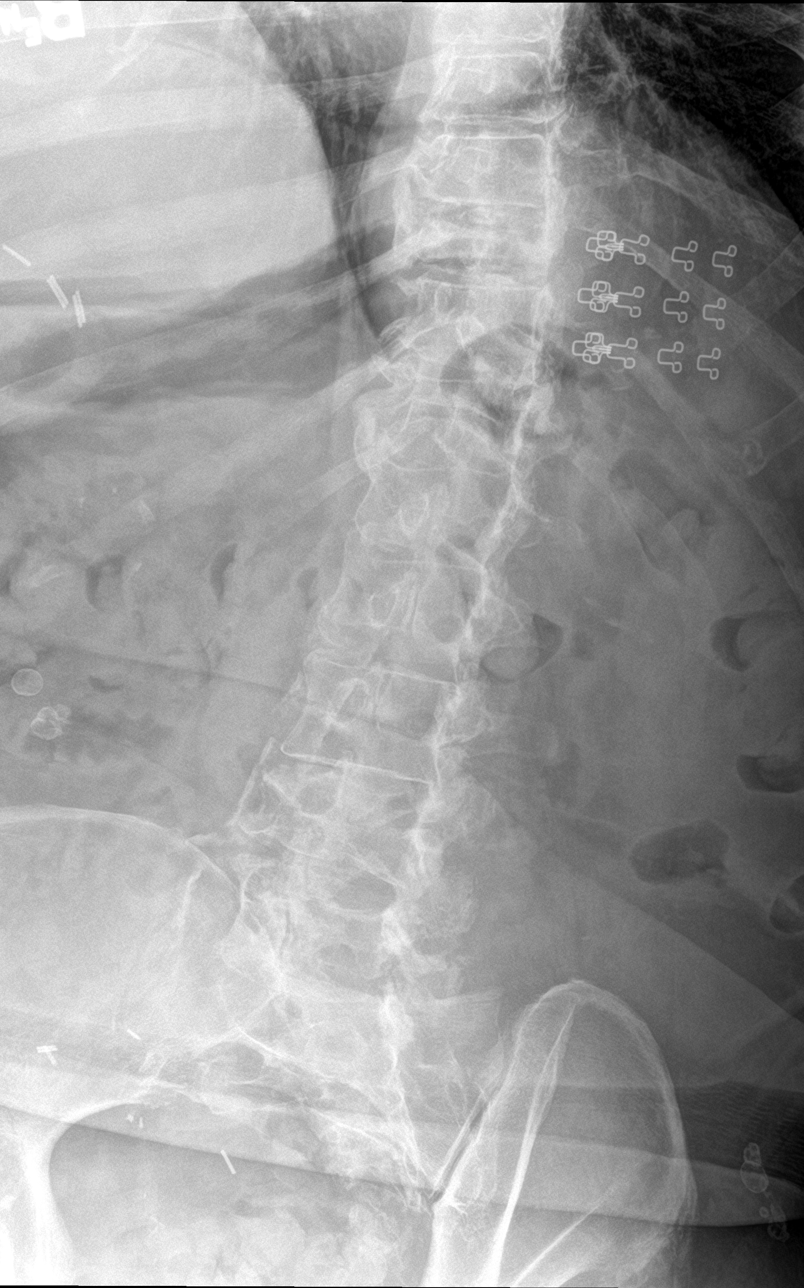

[l-spine obl (2 of 2)]
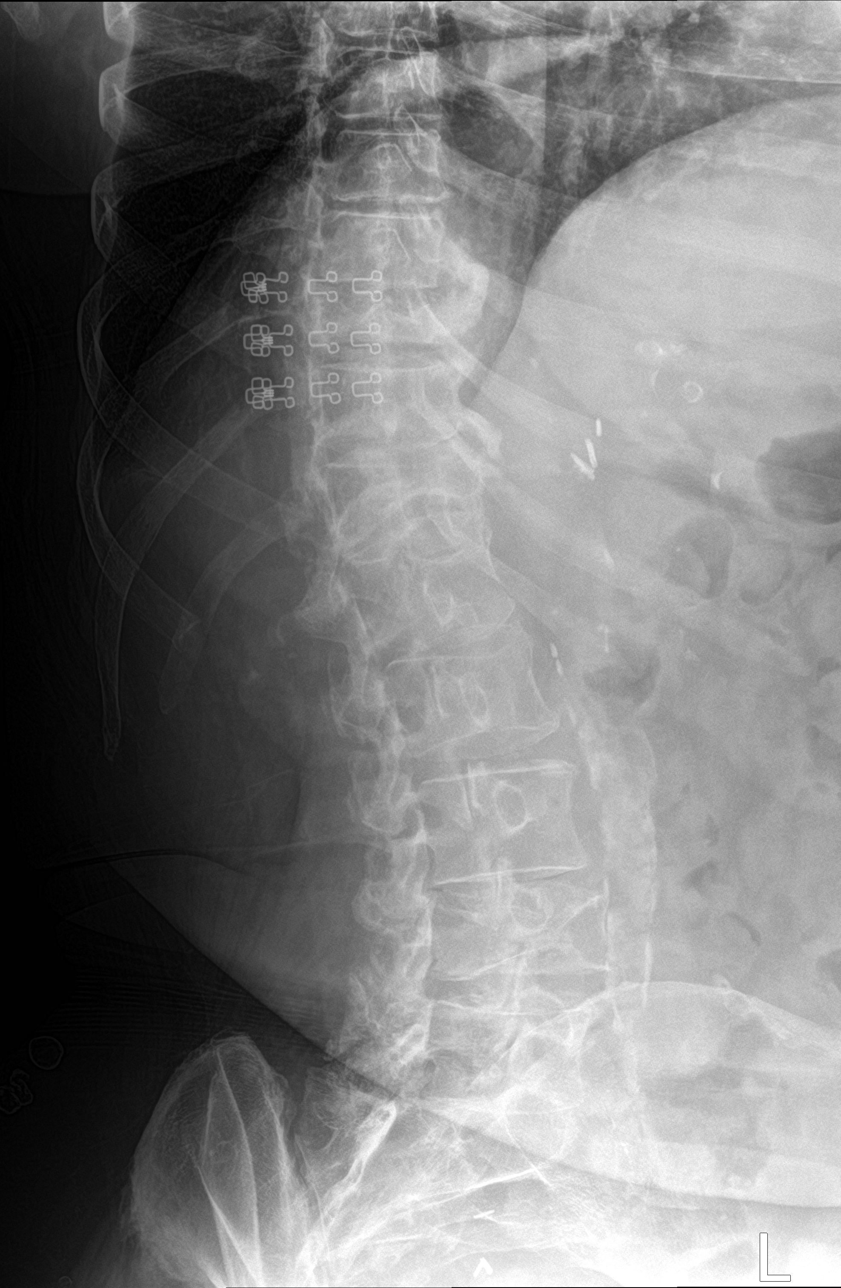

[l-spine lat]
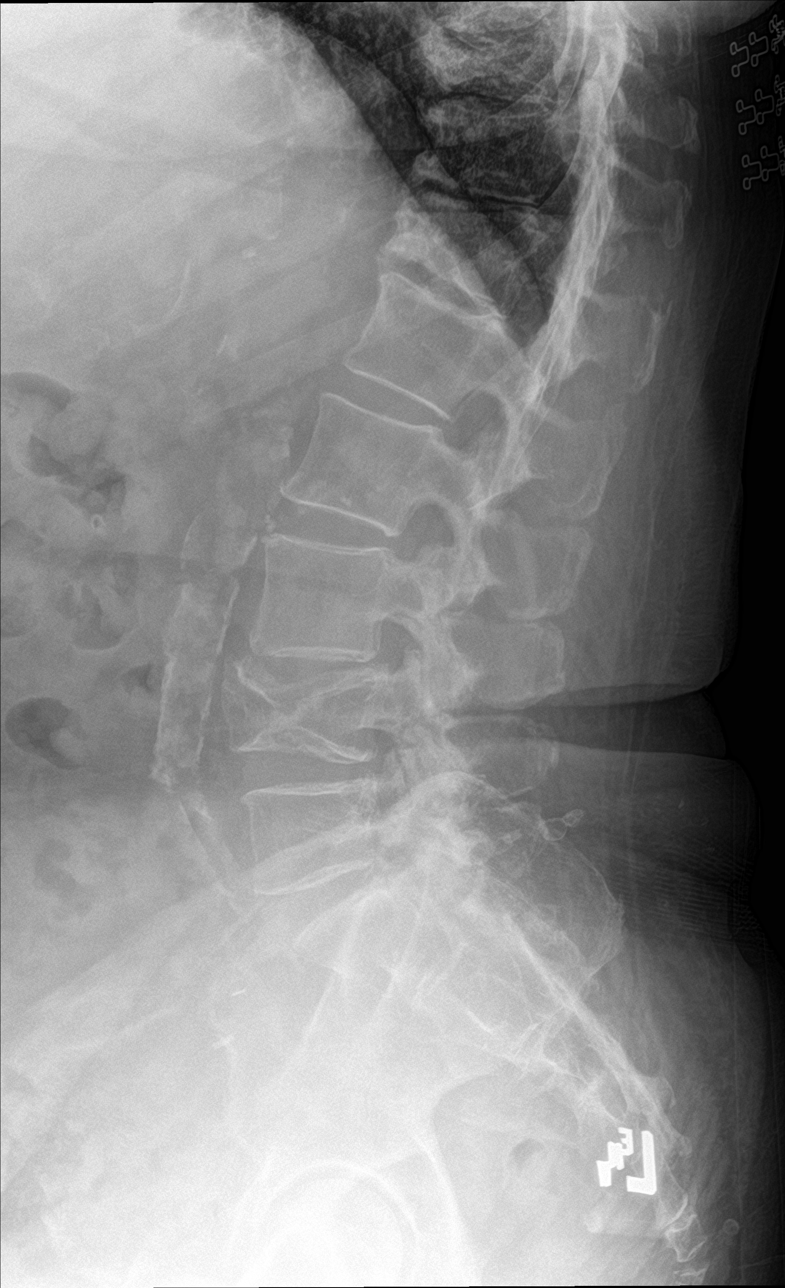

[l-spine spot]
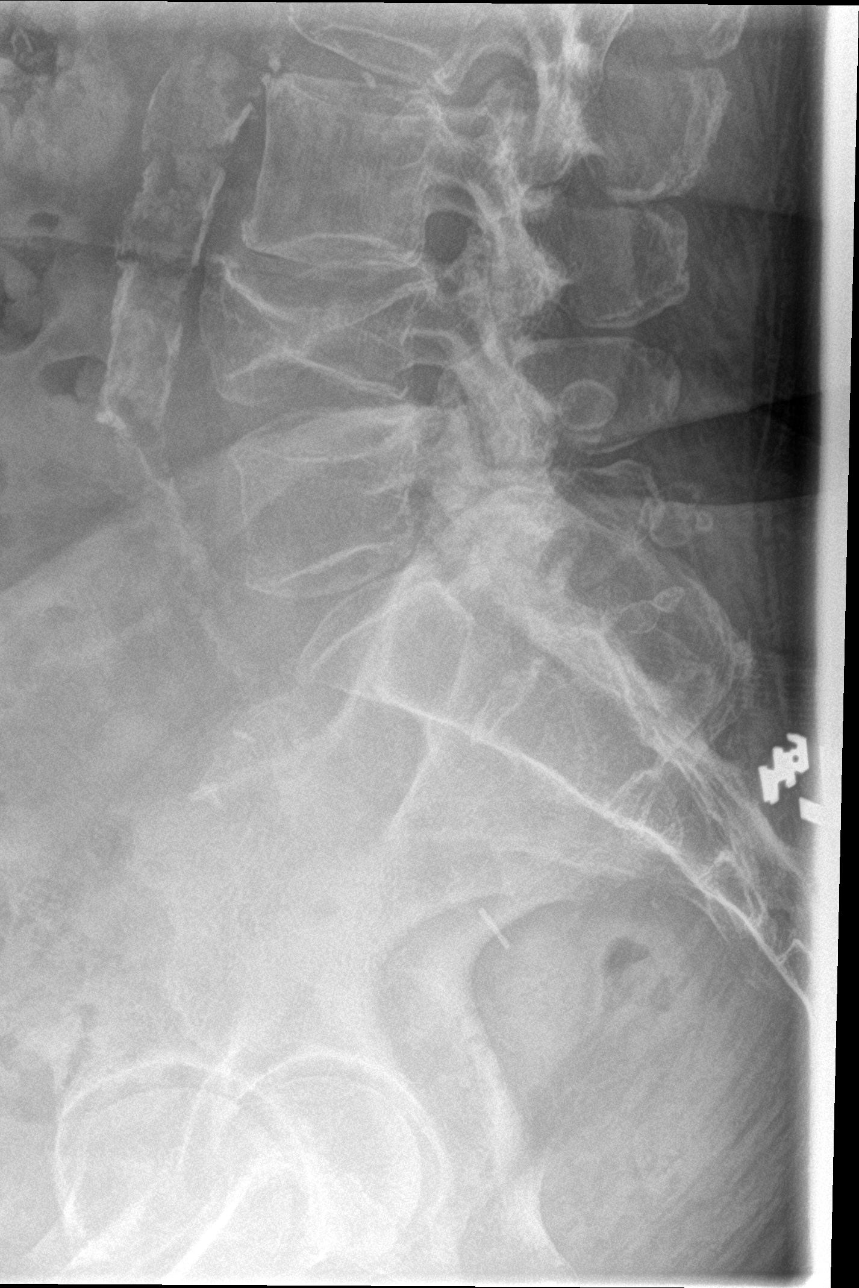

[5 of 5 positions shown; findings below may reference images not displayed]

FINDINGS: Cervical spine:

C1-C5 visible on the lateral view. Normal cervical lordosis without
listhesis. Odontoid process is intact on the open-mouth view. Normal
anterior C1-2 articulation. Prevertebral soft tissue thickening is
likely due to retropharyngeal carotid arteries as seen on prior CT
of cervical spine. No significant loss of vertebral body or disc
space height. Small anterior endplate marginal osteophytes at
multiple levels.

Thoracic spine:

Twelve paired thoracic ribs. Normal thoracic kyphosis without
listhesis. Stable mild T3, mild T4, and moderate T10 chronic
compression deformities discogenic degenerative changes with loss of
intervertebral disc space height and endplate marginal osteophytes
of the lower thoracic spine. Normal cervicothoracic junction
alignment on the swimmer's view.

Lumbar spine:

Five complete lumbar type non-rib-bearing vertebral bodies. Normal
lumbar lordosis without listhesis. Stable chronic mild T12 and
moderate L4 compression deformities no acute fracture identified.
Mild loss of intervertebral disc space height at L3-4 and L5-S1.
Prominent lower lumbar facet arthrosis. Surgical clips project over
the right hemipelvis and right upper quadrant.
IMPRESSION: 1. No acute fracture or dislocation identified in the cervical,
thoracic, or lumbar spine.
2. Stable chronic T3, T4, T10, T12, and L4 compression deformities.
